# Patient Record
Sex: Female | Born: 1950
Health system: Southern US, Community
[De-identification: ages and names within clinical notes are randomized; demographics above are authoritative.]

## PROBLEM LIST (undated history)

## (undated) DIAGNOSIS — Z9889 Other specified postprocedural states: Secondary | ICD-10-CM

## (undated) DIAGNOSIS — I499 Cardiac arrhythmia, unspecified: Secondary | ICD-10-CM

## (undated) DIAGNOSIS — E785 Hyperlipidemia, unspecified: Secondary | ICD-10-CM

## (undated) HISTORY — PX: DILATION AND CURETTAGE OF UTERUS: SHX78

## (undated) HISTORY — DX: Hyperlipidemia, unspecified: E78.5

---

## 1994-01-22 LAB — CONVERTED CEMR LAB: Pap Smear: NORMAL

## 1999-09-22 ENCOUNTER — Encounter: Payer: Self-pay | Admitting: Obstetrics and Gynecology

## 1999-09-22 ENCOUNTER — Encounter: Admission: RE | Admit: 1999-09-22 | Discharge: 1999-09-22 | Payer: Self-pay | Admitting: Obstetrics and Gynecology

## 2000-10-11 ENCOUNTER — Encounter: Payer: Self-pay | Admitting: Obstetrics and Gynecology

## 2000-10-11 ENCOUNTER — Encounter: Admission: RE | Admit: 2000-10-11 | Discharge: 2000-10-11 | Payer: Self-pay | Admitting: Obstetrics and Gynecology

## 2001-12-05 ENCOUNTER — Encounter: Admission: RE | Admit: 2001-12-05 | Discharge: 2001-12-05 | Payer: Self-pay | Admitting: Obstetrics and Gynecology

## 2001-12-05 ENCOUNTER — Encounter: Payer: Self-pay | Admitting: Obstetrics and Gynecology

## 2002-12-13 ENCOUNTER — Encounter: Admission: RE | Admit: 2002-12-13 | Discharge: 2002-12-13 | Payer: Self-pay | Admitting: Obstetrics and Gynecology

## 2002-12-13 ENCOUNTER — Encounter: Payer: Self-pay | Admitting: Obstetrics and Gynecology

## 2003-12-30 ENCOUNTER — Ambulatory Visit (HOSPITAL_COMMUNITY): Admission: RE | Admit: 2003-12-30 | Discharge: 2003-12-30 | Payer: Self-pay | Admitting: Obstetrics and Gynecology

## 2004-08-14 ENCOUNTER — Ambulatory Visit (HOSPITAL_COMMUNITY): Admission: RE | Admit: 2004-08-14 | Discharge: 2004-08-14 | Payer: Self-pay | Admitting: *Deleted

## 2005-02-01 ENCOUNTER — Ambulatory Visit (HOSPITAL_COMMUNITY): Admission: RE | Admit: 2005-02-01 | Discharge: 2005-02-01 | Payer: Self-pay | Admitting: Obstetrics and Gynecology

## 2006-02-28 ENCOUNTER — Ambulatory Visit (HOSPITAL_COMMUNITY): Admission: RE | Admit: 2006-02-28 | Discharge: 2006-02-28 | Payer: Self-pay | Admitting: Obstetrics and Gynecology

## 2006-03-09 ENCOUNTER — Encounter: Admission: RE | Admit: 2006-03-09 | Discharge: 2006-03-09 | Payer: Self-pay | Admitting: Obstetrics and Gynecology

## 2007-04-03 ENCOUNTER — Ambulatory Visit (HOSPITAL_COMMUNITY): Admission: RE | Admit: 2007-04-03 | Discharge: 2007-04-03 | Payer: Self-pay | Admitting: Obstetrics and Gynecology

## 2008-04-15 ENCOUNTER — Ambulatory Visit (HOSPITAL_COMMUNITY): Admission: RE | Admit: 2008-04-15 | Discharge: 2008-04-15 | Payer: Self-pay | Admitting: Obstetrics and Gynecology

## 2009-04-21 ENCOUNTER — Ambulatory Visit (HOSPITAL_COMMUNITY): Admission: RE | Admit: 2009-04-21 | Discharge: 2009-04-21 | Payer: Self-pay | Admitting: Obstetrics and Gynecology

## 2009-05-28 ENCOUNTER — Encounter: Payer: Self-pay | Admitting: Family Medicine

## 2010-01-26 ENCOUNTER — Ambulatory Visit: Payer: Self-pay | Admitting: Family Medicine

## 2010-01-26 DIAGNOSIS — E785 Hyperlipidemia, unspecified: Secondary | ICD-10-CM

## 2010-01-26 DIAGNOSIS — I491 Atrial premature depolarization: Secondary | ICD-10-CM

## 2010-01-26 DIAGNOSIS — G43909 Migraine, unspecified, not intractable, without status migrainosus: Secondary | ICD-10-CM | POA: Insufficient documentation

## 2010-01-26 DIAGNOSIS — M810 Age-related osteoporosis without current pathological fracture: Secondary | ICD-10-CM | POA: Insufficient documentation

## 2010-03-04 ENCOUNTER — Ambulatory Visit
Admission: RE | Admit: 2010-03-04 | Discharge: 2010-03-04 | Payer: Self-pay | Source: Home / Self Care | Attending: Family Medicine | Admitting: Family Medicine

## 2010-03-04 ENCOUNTER — Other Ambulatory Visit: Payer: Self-pay | Admitting: Family Medicine

## 2010-03-04 LAB — CBC WITH DIFFERENTIAL/PLATELET
Basophils Absolute: 0 10*3/uL (ref 0.0–0.1)
Basophils Relative: 0.7 % (ref 0.0–3.0)
Eosinophils Absolute: 0.1 10*3/uL (ref 0.0–0.7)
Eosinophils Relative: 1.5 % (ref 0.0–5.0)
HCT: 43 % (ref 36.0–46.0)
Hemoglobin: 14.4 g/dL (ref 12.0–15.0)
Lymphocytes Relative: 35.9 % (ref 12.0–46.0)
Lymphs Abs: 1.7 10*3/uL (ref 0.7–4.0)
MCHC: 33.6 g/dL (ref 30.0–36.0)
MCV: 96.9 fl (ref 78.0–100.0)
Monocytes Absolute: 0.3 10*3/uL (ref 0.1–1.0)
Monocytes Relative: 7 % (ref 3.0–12.0)
Neutro Abs: 2.5 10*3/uL (ref 1.4–7.7)
Neutrophils Relative %: 54.9 % (ref 43.0–77.0)
Platelets: 284 10*3/uL (ref 150.0–400.0)
RBC: 4.44 Mil/uL (ref 3.87–5.11)
RDW: 12.7 % (ref 11.5–14.6)
WBC: 4.6 10*3/uL (ref 4.5–10.5)

## 2010-03-04 LAB — BASIC METABOLIC PANEL
BUN: 13 mg/dL (ref 6–23)
CO2: 28 mEq/L (ref 19–32)
Calcium: 9.3 mg/dL (ref 8.4–10.5)
Chloride: 106 mEq/L (ref 96–112)
Creatinine, Ser: 0.7 mg/dL (ref 0.4–1.2)
GFR: 85.05 mL/min (ref >60.00)
Glucose, Bld: 80 mg/dL (ref 70–99)
Potassium: 4.7 mEq/L (ref 3.5–5.1)
Sodium: 141 mEq/L (ref 135–145)

## 2010-03-04 LAB — CONVERTED CEMR LAB
Bilirubin Urine: NEGATIVE
Glucose, Urine, Semiquant: NEGATIVE
Ketones, urine, test strip: NEGATIVE
Nitrite: NEGATIVE
Specific Gravity, Urine: 1.015
Urobilinogen, UA: 0.2

## 2010-03-04 LAB — LIPID PANEL
Cholesterol: 201 mg/dL — ABNORMAL HIGH (ref 0–200)
HDL: 53.1 mg/dL (ref >39.00)
Total CHOL/HDL Ratio: 4
Triglycerides: 63 mg/dL (ref 0.0–149.0)
VLDL: 12.6 mg/dL (ref 0.0–40.0)

## 2010-03-04 LAB — HEPATIC FUNCTION PANEL
ALT: 22 U/L (ref 0–35)
AST: 25 U/L (ref 0–37)
Albumin: 3.9 g/dL (ref 3.5–5.2)
Alkaline Phosphatase: 55 U/L (ref 39–117)
Bilirubin, Direct: 0.1 mg/dL (ref 0.0–0.3)
Total Bilirubin: 0.8 mg/dL (ref 0.3–1.2)
Total Protein: 6.7 g/dL (ref 6.0–8.3)

## 2010-03-04 LAB — LDL CHOLESTEROL, DIRECT: Direct LDL: 142.6 mg/dL

## 2010-03-04 LAB — TSH: TSH: 1.48 u[IU]/mL (ref 0.35–5.50)

## 2010-03-11 ENCOUNTER — Ambulatory Visit
Admission: RE | Admit: 2010-03-11 | Discharge: 2010-03-11 | Payer: Self-pay | Source: Home / Self Care | Attending: Family Medicine | Admitting: Family Medicine

## 2010-03-24 NOTE — Assessment & Plan Note (Signed)
Summary: PT TO BE EST/RCD   Vital Signs:  Patient profile:   60 year old female Menstrual status:  postmenopausal LMP:     01/22/1994 Height:      65.25 inches Weight:      114 pounds BMI:     18.89 Temp:     98.5 degrees F oral Pulse rate:   12 / minute Pulse rhythm:   regular Resp:     80 per minute BP sitting:   110 / 76  (left arm) Cuff size:   regular  Vitals Entered By: Sid Falcon LPN (January 26, 2010 10:40 AM)  History of Present Illness: Patient is here to establish care.  She has past history of osteoporosis, migraine headaches, mild hyperlipidemia, and PACs. She had D and C back in 1986 and no other surgeries.  Takes calcium and vitamin D supplementation but no prescription medications. Allergy to penicillin and codeine.  She has been reluctant to start any osteoporosis meds after discussion with her former primary care physician.  Family history significant for father and sister with colon cancer. Patient has been receiving colonoscopy every 5 years and is due now. Mother and sister with hyperlipidemia. Mother with coronary disease in her 69s and history of abdominal aortic aneurysm. Father with history of stroke and atrial fibrillation. Mother had bladder cancer.  Patient is married. Nonsmoker. Occasional alcohol use.  She sees gynecologist regularly and had DEXA scan last year and exercises most days of week.  Past History:  Family History: Last updated: 01/26/2010 Father, colon CA, stroke, A-Fib mitrovalve prolapse Mother, high cholesterol, heart disease, diabetes typr ll, bladder CA Sister, colon CA, elevated cholesterol  Social History: Last updated: 01/26/2010 Occupation: Married Alcohol use-yes Smoked for 8 years, quit 3 pregnancies 2 live births  Past Medical History: Chicken pox Frequent  headaches/migraines Hyperlipidemia Premature Atrial Contractions Osteoporosis  Past Surgical History: Miscarriage, DNC 72 PMH-FH-SH reviewed for  relevance  Family History: Father, colon CA, stroke, A-Fib mitrovalve prolapse Mother, high cholesterol, heart disease, diabetes typr ll, bladder CA Sister, colon CA, elevated cholesterol  Social History: Occupation: Married Alcohol use-yes Smoked for 8 years, quit 3 pregnancies 2 live births Occupation:  employed  Review of Systems  The patient denies anorexia, fever, weight loss, weight gain, chest pain, syncope, dyspnea on exertion, peripheral edema, prolonged cough, headaches, hemoptysis, abdominal pain, melena, hematochezia, severe indigestion/heartburn, incontinence, muscle weakness, and depression.    Physical Exam  General:  Well-developed,well-nourished,in no acute distress; alert,appropriate and cooperative throughout examination Mouth:  Oral mucosa and oropharynx without lesions or exudates.  Teeth in good repair. Neck:  No deformities, masses, or tenderness noted. Lungs:  Normal respiratory effort, chest expands symmetrically. Lungs are clear to auscultation, no crackles or wheezes. Heart:  Normal rate and regular rhythm. S1 and S2 normal without gallop, murmur, click, rub or other extra sounds. Extremities:  No clubbing, cyanosis, edema, or deformity noted with normal full range of motion of all joints.   Psych:  normally interactive, good eye contact, not anxious appearing, and not depressed appearing.     Impression & Recommendations:  Problem # 1:  HYPERLIPIDEMIA (ICD-272.4) overall low risk for vascular disease.  Problem # 2:  OSTEOPOROSIS (ICD-733.00) discussed Calcium/Vit D supplement and types of weight bearing exercise.  She is reluctant to consider prescription meds at this time. Her updated medication list for this problem includes:    Calcium 600 Mg Tabs (Calcium) .Marland Kitchen..Marland Kitchen Two times a day    Vitamin D 1000  Unit Tabs (Cholecalciferol) .Marland Kitchen... 2 tabs two times a day  Problem # 3:  PREMATURE ATRIAL CONTRACTIONS (ICD-427.61)  Problem # 4:  MIGRAINE HEADACHE  (ICD-346.90)  Complete Medication List: 1)  Calcium 600 Mg Tabs (Calcium) .... Two times a day 2)  Vitamin D 1000 Unit Tabs (Cholecalciferol) .... 2 tabs two times a day  Patient Instructions: 1)  Schedule complete physical examination at your convenience   Orders Added: 1)  New Patient Level III [25366]    Preventive Care Screening  Last Tetanus Booster:    Date:  01/22/2005    Results:  Historical   Colonoscopy:    Date:  01/22/2005    Results:  normal   Pap Smear:    Date:  01/22/1994    Results:  normal

## 2010-03-26 NOTE — Assessment & Plan Note (Signed)
Summary: cpx/no pap/njr   Vital Signs:  Patient profile:   60 year old female Menstrual status:  postmenopausal Height:      65.5 inches Weight:      114 pounds BMI:     18.75 Temp:     98.2 degrees F oral BP sitting:   110 / 74  (left arm) Cuff size:   regular  Vitals Entered By: Duard Brady LPN (March 11, 2010 10:35 AM)  O2 Flow:  Room air CC: cpx - doing well Is Patient Diabetic? No   History of Present Illness: here for CPE. She sees Gyn for pap smears and mammograms. Exercises several times per week.  Needs repeat colonoscopy this year with pos FH colon cancer in sibling and father.  Declines flu.  Tetanus up to date.  Clinical Review Panels:  Prevention   Last Mammogram:  Normal (04/21/2009)   Last Pap Smear:  normal (01/22/1994)   Last Colonoscopy:  normal (01/22/2005)  Immunizations   Last Tetanus Booster:  Historical (01/22/2005)   Allergies (verified): 1)  ! Penicillin 2)  ! * Iv Contrast  Past History:  Past Medical History: Last updated: 01/26/2010 Chicken pox Frequent  headaches/migraines Hyperlipidemia Premature Atrial Contractions Osteoporosis  Past Surgical History: Last updated: 01/26/2010 Miscarriage, DNC 1986  Family History: Last updated: 03/11/2010 Father, colon CA, stroke, A-Fib mitrovalve prolapse Mother, high cholesterol, heart disease CABG 60s, diabetes typr ll, bladder CA Sister, colon CA, elevated cholesterol  Social History: Last updated: 01/26/2010 Occupation: Married Alcohol use-yes Smoked for 8 years, quit 3 pregnancies 2 live births PMH-FH-SH reviewed for relevance  Family History: Father, colon CA, stroke, A-Fib mitrovalve prolapse Mother, high cholesterol, heart disease CABG 60s, diabetes typr ll, bladder CA Sister, colon CA, elevated cholesterol  Review of Systems  The patient denies anorexia, fever, weight loss, weight gain, vision loss, decreased hearing, hoarseness, chest pain, syncope,  dyspnea on exertion, peripheral edema, prolonged cough, headaches, hemoptysis, abdominal pain, melena, hematochezia, severe indigestion/heartburn, hematuria, incontinence, genital sores, muscle weakness, suspicious skin lesions, transient blindness, difficulty walking, depression, unusual weight change, abnormal bleeding, enlarged lymph nodes, and breast masses.    Physical Exam  General:  Well-developed,well-nourished,in no acute distress; alert,appropriate and cooperative throughout examination Head:  Normocephalic and atraumatic without obvious abnormalities. No apparent alopecia or balding. Eyes:  No corneal or conjunctival inflammation noted. EOMI. Perrla. Funduscopic exam benign, without hemorrhages, exudates or papilledema. Vision grossly normal. Ears:  External ear exam shows no significant lesions or deformities.  Otoscopic examination reveals clear canals, tympanic membranes are intact bilaterally without bulging, retraction, inflammation or discharge. Hearing is grossly normal bilaterally. Mouth:  Oral mucosa and oropharynx without lesions or exudates.  Teeth in good repair. Neck:  No deformities, masses, or tenderness noted. Breasts:  gyn Lungs:  Normal respiratory effort, chest expands symmetrically. Lungs are clear to auscultation, no crackles or wheezes. Heart:  Normal rate and regular rhythm. S1 and S2 normal without gallop, murmur, click, rub or other extra sounds. Abdomen:  Bowel sounds positive,abdomen soft and non-tender without masses, organomegaly or hernias noted. Genitalia:  gyn Msk:  No deformity or scoliosis noted of thoracic or lumbar spine.   Extremities:  No clubbing, cyanosis, edema, or deformity noted with normal full range of motion of all joints.   Neurologic:  alert & oriented X3, cranial nerves II-XII intact, and strength normal in all extremities.   Skin:  no rashes and no suspicious lesions.   Cervical Nodes:  No lymphadenopathy noted Psych:  normally  interactive, good eye contact, not anxious appearing, and not depressed appearing.     Impression & Recommendations:  Problem # 1:  Preventive Health Care (ICD-V70.0) Cont regular exercise.  Labs reviewed.  Mildly elev lipids and we discussed reduction in trans fats and plenty of soluble fiber intake.  She will set up repeat colonoscopy.  Cont f/u with gyn  Complete Medication List: 1)  Calcium 600 Mg Tabs (Calcium) .... Two times a day 2)  Vitamin D 1000 Unit Tabs (Cholecalciferol) .Marland Kitchen.. 1 two times a day  Patient Instructions: 1)  It is important that you exercise reguarly at least 20 minutes 5 times a week. If you develop chest pain, have severe difficulty breathing, or feel very tired, stop exercising immediately and seek medical attention.  2)  Schedule a colonoscopy/ sigmoidoscopy to help detect colon cancer.  3)  Please schedule a follow-up appointment in 1 year.    Orders Added: 1)  Est. Patient 40-64 years [99396]

## 2010-03-26 NOTE — Letter (Signed)
Summary: Records from [facility] 2010 - 2011  Records from [facility] 2010 - 2011   Imported By: Maryln Gottron 02/05/2010 14:49:52  _____________________________________________________________________  External Attachment:    Type:   Image     Comment:   External Document

## 2010-05-11 ENCOUNTER — Other Ambulatory Visit (HOSPITAL_COMMUNITY): Payer: Self-pay | Admitting: Obstetrics and Gynecology

## 2010-05-11 DIAGNOSIS — Z1231 Encounter for screening mammogram for malignant neoplasm of breast: Secondary | ICD-10-CM

## 2010-05-15 ENCOUNTER — Ambulatory Visit (HOSPITAL_COMMUNITY)
Admission: RE | Admit: 2010-05-15 | Discharge: 2010-05-15 | Disposition: A | Discharge status: Home / Self Care | Payer: BC Managed Care – PPO | Source: Ambulatory Visit | Attending: Obstetrics and Gynecology | Admitting: Obstetrics and Gynecology

## 2010-05-15 DIAGNOSIS — Z1231 Encounter for screening mammogram for malignant neoplasm of breast: Secondary | ICD-10-CM | POA: Insufficient documentation

## 2010-05-27 ENCOUNTER — Ambulatory Visit (INDEPENDENT_AMBULATORY_CARE_PROVIDER_SITE_OTHER): Payer: BC Managed Care – PPO | Admitting: Family Medicine

## 2010-05-27 ENCOUNTER — Encounter: Payer: Self-pay | Admitting: Family Medicine

## 2010-05-27 ENCOUNTER — Ambulatory Visit (INDEPENDENT_AMBULATORY_CARE_PROVIDER_SITE_OTHER)
Admission: RE | Admit: 2010-05-27 | Discharge: 2010-05-27 | Disposition: A | Discharge status: Home / Self Care | Payer: BC Managed Care – PPO | Source: Ambulatory Visit | Attending: Family Medicine | Admitting: Family Medicine

## 2010-05-27 VITALS — BP 130/70 | Temp 98.5°F | Ht 65.5 in | Wt 115.0 lb

## 2010-05-27 DIAGNOSIS — M545 Low back pain, unspecified: Secondary | ICD-10-CM

## 2010-05-27 DIAGNOSIS — M79605 Pain in left leg: Secondary | ICD-10-CM

## 2010-05-27 NOTE — Patient Instructions (Signed)
Low Back Sprain with Rehab    A sprain is an injury in which a ligament is torn. The ligaments of the lower back are vulnerable to sprains. However, they are strong and require great force to be injured. These ligaments are important for stabilizing the spinal column. Sprains are classified into three categories. Grade 1 sprains cause pain, but the tendon is not lengthened. Grade 2 sprains include a lengthened ligament, due to the ligament being stretched or partially ruptured. With grade 2 sprains there is still function, although the function may be decreased. Grade 3 sprains involve a complete tear of the tendon or muscle, and function is usually impaired.   SYMPTOMS  Severe pain in the lower back.  Sometimes, a feeling of a "pop," "snap," or tear, at the time of injury.  Tenderness and sometimes swelling at the injury site.  Uncommonly, bruising (contusion) within 48 hours of injury.  Muscle spasms in the back.   CAUSES  Low back sprains occur when a force is placed on the ligaments that is greater than they can handle. Common causes of injury include:  Performing a stressful act while off-balance.  Repetitive stressful activities that involve movement of the lower back.  Direct hit (trauma) to the lower back.   RISK INCREASES WITH   Contact sports (football, wrestling).  Collisions (major skiing accidents).  Sports that require throwing or lifting (baseball, weightlifting).  Sports involving twisting of the spine (gymnastics, diving, tennis, golf).  Poor strength and flexibility.  Inadequate protection.  Previous back injury or surgery (especially fusion).   PREVENTIVE MEASURES   Wear properly fitted and padded protective equipment.  Warm up and stretch properly before activity.  Allow for adequate recovery between workouts.  Maintain physical fitness: l Strength, flexibility, and endurance. l Cardiovascular fitness.  Maintain a healthy body weight.     PROGNOSIS If treated properly, low back sprains usually heal with non-surgical treatment. The length of time for healing depends on the severity of the injury.    POSSIBLE COMPLICATIONS   Recurring symptoms, resulting in a chronic problem.  Chronic inflammation and pain in the low back.  Delayed healing or resolution of symptoms, especially if activity is resumed too soon.  Prolonged impairment.  Unstable or arthritic joints of the low back.   GENERAL TREATMENT CONSIDERATIONS  Treatment first involves the use of ice and medicine, to reduce pain and inflammation. The use of strengthening and stretching exercises may help reduce pain with activity. These exercises may be performed at home or with a therapist. Severe injuries may require referral to a therapist for further evaluation and treatment, such as ultrasound. Your caregiver may advise that you wear a back brace or corset, to help reduce pain and discomfort. Often, prolonged bed rest results in greater harm then benefit. Corticosteroid injections may be recommended. However, these should be reserved for the most serious cases. It is important to avoid using your back when lifting objects. At night, sleep on your back on a firm mattress, with a pillow placed under your knees. If non-surgical treatment is unsuccessful, surgery may be needed.    MEDICATION:   If pain medicine is needed, nonsteroidal anti-inflammatory medicines (aspirin and ibuprofen), or other minor pain relievers (acetaminophen), are often advised.   Do not take pain medicine for 7 days before surgery.   Prescription pain relievers may be given, if your caregiver thinks they are needed. Use only as directed and only as much as you need.  Ointments applied to   the skin may be helpful.  Corticosteroid injections may be given by your caregiver. These injections should be reserved for the most serious cases, because they may only be given a certain number of times.    HEAT AND COLD:   Cold treatment (icing) should be applied for 10 to 15 minutes every 2 to 3 hours for inflammation and pain, and immediately after activity that aggravates your symptoms. Use ice packs or an ice massage.  Heat treatment may be used before performing stretching and strengthening activities prescribed by your caregiver, physical therapist, or athletic trainer. Use a heat pack or a warm water soak.     SEEK MEDICAL CARE IF:   Symptoms get worse or do not improve in 2 to 4 weeks, despite treatment.  You develop numbness or weakness in either leg.  You lose bowel or bladder function.  Any of the following occur after surgery: fever, increased pain, swelling, redness, drainage of fluids, or bleeding in the affected area.  New, unexplained symptoms develop. (Drugs used in treatment may produce side effects.)     EXERCISES   RANGE OF MOTION AND STRETCHING EXERCISES - Low Back Sprain Most people with lower back pain will find that their symptoms get worse with excessive bending forward (flexion) or arching at the lower back (extension). The exercises that will help resolve your symptoms will focus on the opposite motion.    Your physician, physical therapist or athletic trainer will help you determine which exercises will be most helpful to resolve your lower back pain. Do not complete any exercises without first consulting with your caregiver. Discontinue any exercises which make your symptoms worse, until you speak to your caregiver.    If you have pain, numbness or tingling which travels down into your buttocks, leg or foot, the goal of the therapy is for these symptoms to move closer to your back and eventually resolve. Sometimes, these leg symptoms will get better, but your lower back pain may worsen. This is often an indication of progress in your rehabilitation. Be very alert to any changes in your symptoms and the activities in which you participated in the 24 hours prior  to the change. Sharing this information with your caregiver will allow him or her to most efficiently treat your condition.   These exercises may help you when beginning to rehabilitate your injury. Your symptoms may resolve with or without further involvement from your physician, physical therapist or athletic trainer. While completing these exercises, remember:   Restoring tissue flexibility helps normal motion to return to the joints. This allows healthier, less painful movement and activity.  An effective stretch should be held for at least 30 seconds.  A stretch should never be painful. You should only feel a gentle lengthening or release in the stretched tissue.      FLEXION RANGE OF MOTION AND STRETCHING EXERCISES:  STRETCH - Flexion, Single Knee to Chest   Lie on a firm bed or floor with both legs extended in front of you.  Keeping one leg in contact with the floor, bring your opposite knee to your chest. Hold your leg in place by either grabbing behind your thigh or at your knee.  Pull until you feel a gentle stretch in your low back. Hold __________ seconds.  Slowly release your grasp and repeat the exercise with the opposite side. Repeat __________ times. Complete this exercise __________ times per day.     STRETCH - Flexion, Double Knee to Chest     Lie on a firm bed or floor with both legs extended in front of you.  Keeping one leg in contact with the floor, bring your opposite knee to your chest.    Tense your stomach muscles to support your back and then lift your other knee to your chest. Hold your legs in place by either grabbing behind your thighs or at your knees.  Pull both knees toward your chest until you feel a gentle stretch in your low back. Hold __________ seconds.  Tense your stomach muscles and slowly return one leg at a time to the floor. Repeat __________ times. Complete this exercise __________ times per day.     STRETCH - Low Trunk Rotation   Lie  on a firm bed or floor. Keeping your legs in front of you, bend your knees so they are both pointed toward the ceiling and your feet are flat on the floor.  Extend your arms out to the side. This will stabilize your upper body by keeping your shoulders in contact with the floor.  Gently and slowly drop both knees together to one side until you feel a gentle stretch in your low back. Hold for __________ seconds.   Tense your stomach muscles to support your lower back as you bring your knees back to the starting position. Repeat the exercise to the other side. Repeat __________ times. Complete this exercise __________ times per day      EXTENSION RANGE OF MOTION AND FLEXIBILITY EXERCISES:  STRETCH - Extension, Prone on Elbows   Lie on your stomach on the floor, a bed will be too soft. Place your palms about shoulder width apart and at the height of your head.  Place your elbows under your shoulders. If this is too painful, stack pillows under your chest.  Allow your body to relax so that your hips drop lower and make contact more completely with the floor.  Hold this position for __________ seconds.  Slowly return to lying flat on the floor. Repeat __________ times. Complete this exercise __________ times per day.     RANGE OF MOTION - Extension, Prone Press Ups   Lie on your stomach on the floor, a bed will be too soft. Place your palms about shoulder width apart and at the height of your head.  Keeping your back as relaxed as possible, slowly straighten your elbows while keeping your hips on the floor. You may adjust the placement of your hands to maximize your comfort. As you gain motion, your hands will come more underneath your shoulders.  Hold this position __________ seconds.  Slowly return to lying flat on the floor. Repeat __________ times. Complete this exercise __________ times per day.     RANGE OF MOTION- Quadruped, Neutral Spine   Assume a hands and knees position  on a firm surface. Keep your hands under your shoulders and your knees under your hips. You may place padding under your knees for comfort.    Drop your head and point your tailbone toward the ground below you. This will round out your lower back like an angry cat. Hold this position for __________ seconds.   Slowly lift your head and release your tail bone so that your back sags into a large arch, like an old horse.  Hold this position for __________ seconds.   Repeat this until you feel limber in your low back.  Now, find your "sweet spot." This will be the most comfortable position somewhere between the two previous   positions. This is your neutral spine. Once you have found this position, tense your stomach muscles to support your low back.  Hold this position for __________ seconds. Repeat __________ times. Complete this exercise __________ times per day.      STRENGTHENING EXERCISES - Low Back Sprain These exercises may help you when beginning to rehabilitate your injury. These exercises should be done near your "sweet spot." This is the neutral, low-back arch, somewhere between fully rounded and fully arched, that is your least painful position. When performed in this safe range of motion, these exercises can be used for people who have either a flexion or extension based injury. These exercises may resolve your symptoms with or without further involvement from your physician, physical therapist or athletic trainer. While completing these exercises, remember:   Muscles can gain both the endurance and the strength needed for everyday activities through controlled exercises.  Complete these exercises as instructed by your physician, physical therapist or athletic trainer. Increase the resistance and repetitions only as guided.  You may experience muscle soreness or fatigue, but the pain or discomfort you are trying to eliminate should never worsen during these exercises. If this pain does  worsen, stop and make certain you are following the directions exactly. If the pain is still present after adjustments, discontinue the exercise until you can discuss the trouble with your caregiver.     STRENGTHENING - Deep Abdominals, Pelvic Tilt   Lie on a firm bed or floor. Keeping your legs in front of you, bend your knees so they are both pointed toward the ceiling and your feet are flat on the floor.  Tense your lower abdominal muscles to press your low back into the floor.  This motion will rotate your pelvis so that your tail bone is scooping upwards rather than pointing at your feet or into the floor. With a gentle tension and even breathing, hold this position for __________ seconds. Repeat __________ times. Complete this exercise __________ times per day.      STRENGTHENING - Abdominals, Crunches   Lie on a firm bed or floor. Keeping your legs in front of you, bend your knees so they are both pointed toward the ceiling and your feet are flat on the floor. Cross your arms over your chest.    Slightly tip your chin down without bending your neck.  Tense your abdominals and slowly lift your trunk high enough to just clear your shoulder blades. Lifting higher can put excessive stress on the lower back and does not further strengthen your abdominal muscles.  Control your return to the starting position. Repeat __________ times. Complete this exercise __________ times per day.     STRENGTHENING - Quadruped, Opposite UE/LE Lift   Assume a hands and knees position on a firm surface. Keep your hands under your shoulders and your knees under your hips. You may place padding under your knees for comfort.    Find your neutral spine and gently tense your abdominal muscles so that you can maintain this position. Your shoulders and hips should form a rectangle that is parallel with the floor and is not twisted.   Keeping your trunk steady, lift your right hand no higher than your shoulder  and then your left leg no higher than your hip. Make sure you are not holding your breath. Hold this position for __________ seconds.  Continuing to keep your abdominal muscles tense and your back steady, slowly return to your starting position. Repeat with the   opposite arm and leg. Repeat __________ times. Complete this exercise __________ times per day.      STRENGTHENING - Abdominals and Quadriceps, Straight Leg Raise   Lie on a firm bed or floor with both legs extended in front of you.  Keeping one leg in contact with the floor, bend the other knee so that your foot can rest flat on the floor.  Find your neutral spine, and tense your abdominal muscles to maintain your spinal position throughout the exercise.  Slowly lift your straight leg off the floor about 6 inches for a count of 15, making sure to not hold your breath.  Still keeping your neutral spine, slowly lower your leg all the way to the floor.   Repeat this exercise with each leg __________ times. Complete this exercise __________ times per day.     POSTURE AND BODY MECHANICS CONSIDERATIONS - Low Back Sprain Keeping correct posture when sitting, standing or completing your activities will reduce the stress put on different body tissues, allowing injured tissues a chance to heal and limiting painful experiences. The following are general guidelines for improved posture. Your physician or physical therapist will provide you with any instructions specific to your needs. While reading these guidelines, remember:  The exercises prescribed by your provider will help you have the flexibility and strength to maintain correct postures.  The correct posture provides the best environment for your joints to work. All of your joints have less wear and tear when properly supported by a spine with good posture. This means you will experience a healthier, less painful body.  Correct posture must be practiced with all of your activities,  especially prolonged sitting and standing. Correct posture is as important when doing repetitive low-stress activities (typing) as it is when doing a single heavy-load activity (lifting).     RESTING POSITIONS Consider which positions are most painful for you when choosing a resting position. If you have pain with flexion-based activities (sitting, bending, stooping, squatting), choose a position that allows you to rest in a less flexed posture. You would want to avoid curling into a fetal position on your side. If your pain worsens with extension-based activities (prolonged standing, working overhead), avoid resting in an extended position such as sleeping on your stomach. Most people will find more comfort when they rest with their spine in a more neutral position, neither too rounded nor too arched. Lying on a non-sagging bed on your side with a pillow between your knees, or on your back with a pillow under your knees will often provide some relief.  Keep in mind, being in any one position for a prolonged period of time, no matter how correct your posture, can still lead to stiffness.    PROPER SITTING POSTURE In order to minimize stress and discomfort on your spine, you must sit with correct posture. Sitting with good posture should be effortless for a healthy body. Returning to good posture is a gradual process. Many people can work toward this most comfortably by using various supports until they have the flexibility and strength to maintain this posture on their own.   When sitting with proper posture, your ears will fall over your shoulders and your shoulders will fall over your hips. You should use the back of the chair to support your upper back. Your lower back will be in a neutral position, just slightly arched. You may place a small pillow or folded towel at the base of your lower back for    support.    When working at a desk, create an environment that supports good, upright posture.  Without extra support, muscles tire, which leads to excessive strain on joints and other tissues. Keep these recommendations in mind:   CHAIR:    A chair should be able to slide under your desk when your back makes contact with the back of the chair. This allows you to work closely.  The chair's height should allow your eyes to be level with the upper part of your monitor and your hands to be slightly lower than your elbows.      BODY POSITION  Your feet should make contact with the floor. If this is not possible, use a foot rest.  Keep your ears over your shoulders. This will reduce stress on your neck and low back.     INCORRECT SITTING POSTURES  If you are feeling tired and unable to assume a healthy sitting posture, do not slouch or slump. This puts excessive strain on your back tissues, causing more damage and pain. Healthier options include:  Using more support, like a lumbar pillow.  Switching tasks to something that requires you to be upright or walking.  Talking a brief walk.  Lying down to rest in a neutral-spine position.      PROLONGED STANDING WHILE SLIGHTLY LEANING FORWARD  When completing a task that requires you to lean forward while standing in one place for a long time, place either foot up on a stationary 2-4 inch high object to help maintain the best posture. When both feet are on the ground, the lower back tends to lose its slight inward curve. If this curve flattens (or becomes too large), then the back and your other joints will experience too much stress, tire more quickly, and can cause pain.       CORRECT STANDING POSTURES Proper standing posture should be assumed with all daily activities, even if they only take a few moments, like when brushing your teeth. As in sitting, your ears should fall over your shoulders and your shoulders should fall over your hips. You should keep a slight tension in your abdominal muscles to brace your spine. Your tailbone  should point down to the ground, not behind your body, resulting in an over-extended swayback posture.      INCORRECT STANDING POSTURES  Common incorrect standing postures include a forward head, locked knees and/or an excessive swayback.     WALKING Walk with an upright posture. Your ears, shoulders and hips should all line-up.     PROLONGED ACTIVITY IN A FLEXED POSITION When completing a task that requires you to bend forward at your waist or lean over a low surface, try to find a way to stabilize 3 out of 4 of your limbs. You can place a hand or elbow on your thigh or rest a knee on the surface you are reaching across. This will provide you more stability, so that your muscles do not tire as quickly. By keeping your knees relaxed, or slightly bent, you will also reduce stress across your lower back.     CORRECT LIFTING TECHNIQUES DO :   Assume a wide stance. This will provide you more stability and the opportunity to get as close as possible to the object which you are lifting.  Tense your abdominals to brace your spine. Bend at the knees and hips. Keeping your back locked in a neutral-spine position, lift using your leg muscles. Lift with your legs, keeping your   back straight.  Test the weight of unknown objects before attempting to lift them.  Try to keep your elbows locked down at your sides in order get the best strength from your shoulders when carrying an object.  Always ask for help when lifting heavy or awkward objects.    INCORRECT LIFTING TECHNIQUES DO NOT:   Lock your knees when lifting, even if it is a small object.  Bend and twist. Pivot at your feet or move your feet when needing to change directions.  Assume that you can safely pick up even a paperclip without proper posture.   Document Released: 02/08/2005  Document Re-Released: 12/06/2008 [employer] Patient Information 2011 [employer]. 

## 2010-05-27 NOTE — Progress Notes (Signed)
  Subjective:    Patient ID: Tammy [name], female    DOB: Sep 02, 1950, 60 y.o.   MRN: [id number]  HPI Patient with history of low back pain mostly left sacroiliac region radiating posterior lateral to just below the knee. Symptoms are somewhat intermittent and worse at night.  Onset 3 months ago roughly. Pain is moderate at times rated 5-6/10. Achy quality. Worse at night. No associated numbness or weakness. No incontinence. No significant pain with walking. Advil helps.   Review of Systems  Constitutional: Negative for fever, chills, activity change and appetite change.  Respiratory: Negative for cough and shortness of breath.   Cardiovascular: Negative for chest pain.  Gastrointestinal: Negative for abdominal pain.  Genitourinary: Negative for dysuria.  Musculoskeletal: Positive for back pain. Negative for myalgias, joint swelling and gait problem.  Skin: Negative for rash.  Hematological: Negative for adenopathy. Does not bruise/bleed easily.       Objective:   Physical Exam  Constitutional: She appears well-developed and well-nourished.  Cardiovascular: Normal rate, regular rhythm and normal heart sounds.   No murmur heard. Pulmonary/Chest: Effort normal and breath sounds normal. She has no wheezes. She has no rales.  Musculoskeletal: She exhibits no edema.       Straight leg raises are negative. No edema lower extremities. No specific areas of point tenderness in the lower lumbar spine  Neurological:       Full-strength lower extremities. Normal sensory function. 2+ reflexes ankle and knee bilaterally  Skin: No rash noted.          Assessment & Plan:  Low back pain. Given duration of symptoms start with plain films. If unremarkable consider trial of physical therapy.

## 2010-06-10 ENCOUNTER — Ambulatory Visit: Payer: BC Managed Care – PPO | Attending: Family Medicine

## 2010-06-10 DIAGNOSIS — M25659 Stiffness of unspecified hip, not elsewhere classified: Secondary | ICD-10-CM | POA: Insufficient documentation

## 2010-06-10 DIAGNOSIS — M25559 Pain in unspecified hip: Secondary | ICD-10-CM | POA: Insufficient documentation

## 2010-06-10 DIAGNOSIS — IMO0001 Reserved for inherently not codable concepts without codable children: Secondary | ICD-10-CM | POA: Insufficient documentation

## 2010-06-19 ENCOUNTER — Ambulatory Visit: Payer: BC Managed Care – PPO

## 2010-06-26 ENCOUNTER — Ambulatory Visit: Payer: BC Managed Care – PPO | Attending: Family Medicine

## 2010-06-26 DIAGNOSIS — IMO0001 Reserved for inherently not codable concepts without codable children: Secondary | ICD-10-CM | POA: Insufficient documentation

## 2010-06-26 DIAGNOSIS — M25659 Stiffness of unspecified hip, not elsewhere classified: Secondary | ICD-10-CM | POA: Insufficient documentation

## 2010-06-26 DIAGNOSIS — M25559 Pain in unspecified hip: Secondary | ICD-10-CM | POA: Insufficient documentation

## 2010-08-03 ENCOUNTER — Encounter: Payer: Self-pay | Admitting: Family Medicine

## 2010-09-02 ENCOUNTER — Inpatient Hospital Stay (INDEPENDENT_AMBULATORY_CARE_PROVIDER_SITE_OTHER)
Admission: RE | Admit: 2010-09-02 | Discharge: 2010-09-02 | Disposition: A | Discharge status: Home / Self Care | Payer: BC Managed Care – PPO | Source: Ambulatory Visit | Attending: Family Medicine | Admitting: Family Medicine

## 2010-09-02 DIAGNOSIS — J029 Acute pharyngitis, unspecified: Secondary | ICD-10-CM

## 2010-09-03 ENCOUNTER — Encounter: Payer: Self-pay | Admitting: Internal Medicine

## 2010-09-03 ENCOUNTER — Ambulatory Visit (INDEPENDENT_AMBULATORY_CARE_PROVIDER_SITE_OTHER): Payer: BC Managed Care – PPO | Admitting: Internal Medicine

## 2010-09-03 ENCOUNTER — Ambulatory Visit: Payer: BC Managed Care – PPO | Admitting: Internal Medicine

## 2010-09-03 VITALS — BP 112/70 | Temp 98.1°F | Wt 115.0 lb

## 2010-09-03 DIAGNOSIS — J029 Acute pharyngitis, unspecified: Secondary | ICD-10-CM

## 2010-09-03 NOTE — Patient Instructions (Signed)
Get plenty of rest, Drink lots of  clear liquids, and use Tylenol or ibuprofen for fever and discomfort.    Call or return to clinic prn if these symptoms worsen or fail to improve as anticipated.  

## 2010-09-03 NOTE — Progress Notes (Signed)
  Subjective:    Patient ID: Tammy [name], female    DOB: October 25, 1950, 60 y.o.   MRN: [id number]  HPI 60 year old patient who is seen today for followup. She was seen at the urgent care yesterday after 3 days of sore throat. She has remote history of strep. There's been no fever. She was placed on a Z-Pak due to a penicillin allergy. A rapid strep was negative there was some concern about a possible early peritonsillar abscess it was recommended that she follow up today. The patient complains of only mild sore throat no constitutional complaints and no fever. She has completed one of 3 days of azithromycin    Review of Systems  Constitutional: Negative.   HENT: Negative for hearing loss, congestion, sore throat, rhinorrhea, dental problem, sinus pressure and tinnitus.   Eyes: Negative for pain, discharge and visual disturbance.  Respiratory: Negative for cough and shortness of breath.   Cardiovascular: Negative for chest pain, palpitations and leg swelling.  Gastrointestinal: Negative for nausea, vomiting, abdominal pain, diarrhea, constipation, blood in stool and abdominal distention.  Genitourinary: Negative for dysuria, urgency, frequency, hematuria, flank pain, vaginal bleeding, vaginal discharge, difficulty urinating, vaginal pain and pelvic pain.  Musculoskeletal: Negative for joint swelling, arthralgias and gait problem.  Skin: Negative for rash.  Neurological: Negative for dizziness, syncope, speech difficulty, weakness, numbness and headaches.  Hematological: Negative for adenopathy.  Psychiatric/Behavioral: Negative for behavioral problems, dysphoric mood and agitation. The patient is not nervous/anxious.        Objective:   Physical Exam  Constitutional: She is oriented to person, place, and time. She appears well-developed and well-nourished. No distress.  HENT:  Head: Normocephalic.  Right Ear: External ear normal.  Left Ear: External ear normal.       Very mild erythema of  the oropharynx. The soft palate slightly asymmetric but nothing to suggest a soft tissue abscess. No cervical adenopathy  Eyes: Conjunctivae and EOM are normal. Pupils are equal, round, and reactive to light.  Neck: Normal range of motion. Neck supple. No thyromegaly present.  Cardiovascular: Normal rate, regular rhythm, normal heart sounds and intact distal pulses.   Pulmonary/Chest: Effort normal and breath sounds normal.  Abdominal: Soft. Bowel sounds are normal. She exhibits no mass. There is no tenderness.  Musculoskeletal: Normal range of motion.  Lymphadenopathy:    She has no cervical adenopathy.  Neurological: She is alert and oriented to person, place, and time.  Skin: Skin is warm and dry. No rash noted.  Psychiatric: She has a normal mood and affect. Her behavior is normal.          Assessment & Plan:   Viral pharyngitis.  She will call if there is any clinical worsening. Tylenol and/or Advil recommended

## 2010-09-17 ENCOUNTER — Encounter: Payer: Self-pay | Admitting: Family Medicine

## 2010-09-17 ENCOUNTER — Ambulatory Visit (INDEPENDENT_AMBULATORY_CARE_PROVIDER_SITE_OTHER): Payer: BC Managed Care – PPO | Admitting: Family Medicine

## 2010-09-17 VITALS — BP 140/80 | Temp 98.6°F | Wt 114.0 lb

## 2010-09-17 DIAGNOSIS — B9789 Other viral agents as the cause of diseases classified elsewhere: Secondary | ICD-10-CM

## 2010-09-17 DIAGNOSIS — L988 Other specified disorders of the skin and subcutaneous tissue: Secondary | ICD-10-CM

## 2010-09-17 NOTE — Progress Notes (Signed)
  Subjective:    Patient ID: Tammy [name], female    DOB: 1951-01-23, 60 y.o.   MRN: [id number]  HPI Patient seen with small blisterlike lesion right soft palate noted couple days ago. About 2 weeks ago had severe sore throat. Went to urgent care. Rapid strep negative. Some question of early peritonsillar abscess and treated with Zithromax. Reevaluated here the next day with no concern for peritonsillar abscess. Symptoms gradually improved with salt water gargles. Sore throat is fully resolved and she has not had any adenopathy. No pain with swallowing. She does notice a small blisterlike lesion couple days ago. No hand or foot rash. No appetite or weight change   Review of Systems  Constitutional: Negative for fever, chills and fatigue.  HENT: Negative for congestion, sore throat and sinus pressure.   Respiratory: Negative for cough.   Skin: Negative for rash.       Objective:   Physical Exam  Constitutional: She appears well-developed and well-nourished.  HENT:  Head: Normocephalic.  Right Ear: External ear normal.  Left Ear: External ear normal.       No exudate. Minimal post pharynx erythema. Very small approximately 1 minute millimeter vesicular-type lesion right soft palate area. No soft palate asymmetry and no evidence for abscess. No masses  Neck: Neck supple.  Cardiovascular: Normal rate and regular rhythm.   Pulmonary/Chest: Effort normal and breath sounds normal. She has no wheezes. She has no rales.  Musculoskeletal: She exhibits no edema.  Lymphadenopathy:    She has no cervical adenopathy.          Assessment & Plan:  Small vesicular lesion isolated right soft palate. Probably viral. Question coxsackie but no hand or foot lesions. Saltwater gargles and observe

## 2010-09-17 NOTE — Patient Instructions (Signed)
Salt water gargles and touch base if any persistent or worsening symptoms.

## 2011-05-12 ENCOUNTER — Other Ambulatory Visit (HOSPITAL_COMMUNITY): Payer: Self-pay | Admitting: Obstetrics and Gynecology

## 2011-05-12 DIAGNOSIS — Z1231 Encounter for screening mammogram for malignant neoplasm of breast: Secondary | ICD-10-CM

## 2011-06-04 ENCOUNTER — Ambulatory Visit (HOSPITAL_COMMUNITY): Payer: BC Managed Care – PPO

## 2011-06-24 ENCOUNTER — Ambulatory Visit (HOSPITAL_COMMUNITY)
Admission: RE | Admit: 2011-06-24 | Discharge: 2011-06-24 | Disposition: A | Discharge status: Home / Self Care | Payer: BC Managed Care – PPO | Source: Ambulatory Visit | Attending: Obstetrics and Gynecology | Admitting: Obstetrics and Gynecology

## 2011-06-24 DIAGNOSIS — Z1231 Encounter for screening mammogram for malignant neoplasm of breast: Secondary | ICD-10-CM | POA: Insufficient documentation

## 2011-08-06 ENCOUNTER — Other Ambulatory Visit (INDEPENDENT_AMBULATORY_CARE_PROVIDER_SITE_OTHER): Payer: BC Managed Care – PPO

## 2011-08-06 DIAGNOSIS — Z0000 Encounter for general adult medical examination without abnormal findings: Secondary | ICD-10-CM

## 2011-08-06 LAB — POCT URINALYSIS DIPSTICK
Bilirubin, UA: NEGATIVE
Glucose, UA: NEGATIVE
Ketones, UA: NEGATIVE
Protein, UA: NEGATIVE
Spec Grav, UA: 1.015
Urobilinogen, UA: 0.2
pH, UA: 6.5

## 2011-08-06 LAB — TSH: TSH: 1.17 u[IU]/mL (ref 0.35–5.50)

## 2011-08-06 LAB — CBC WITH DIFFERENTIAL/PLATELET
Basophils Absolute: 0 10*3/uL (ref 0.0–0.1)
Basophils Relative: 0.8 % (ref 0.0–3.0)
Eosinophils Absolute: 0.1 10*3/uL (ref 0.0–0.7)
Eosinophils Relative: 1.1 % (ref 0.0–5.0)
HCT: 44.5 % (ref 36.0–46.0)
Hemoglobin: 14.5 g/dL (ref 12.0–15.0)
Lymphocytes Relative: 33.6 % (ref 12.0–46.0)
Lymphs Abs: 1.7 10*3/uL (ref 0.7–4.0)
MCHC: 32.5 g/dL (ref 30.0–36.0)
MCV: 97.9 fl (ref 78.0–100.0)
Monocytes Absolute: 0.4 10*3/uL (ref 0.1–1.0)
Monocytes Relative: 7.8 % (ref 3.0–12.0)
Neutro Abs: 3 10*3/uL (ref 1.4–7.7)
Neutrophils Relative %: 56.7 % (ref 43.0–77.0)
Platelets: 270 10*3/uL (ref 150.0–400.0)
RDW: 12.9 % (ref 11.5–14.6)

## 2011-08-06 LAB — BASIC METABOLIC PANEL
CO2: 29 mEq/L (ref 19–32)
Calcium: 9.3 mg/dL (ref 8.4–10.5)
Chloride: 108 mEq/L (ref 96–112)
Creatinine, Ser: 0.8 mg/dL (ref 0.4–1.2)
GFR: 80.85 mL/min (ref >60.00)
Glucose, Bld: 81 mg/dL (ref 70–99)
Sodium: 141 mEq/L (ref 135–145)

## 2011-08-06 LAB — HEPATIC FUNCTION PANEL
ALT: 22 U/L (ref 0–35)
AST: 26 U/L (ref 0–37)
Albumin: 4 g/dL (ref 3.5–5.2)
Alkaline Phosphatase: 65 U/L (ref 39–117)
Bilirubin, Direct: 0.1 mg/dL (ref 0.0–0.3)
Total Bilirubin: 1 mg/dL (ref 0.3–1.2)
Total Protein: 7.1 g/dL (ref 6.0–8.3)

## 2011-08-06 LAB — LIPID PANEL
HDL: 57.4 mg/dL (ref >39.00)
Total CHOL/HDL Ratio: 3
Triglycerides: 53 mg/dL (ref 0.0–149.0)

## 2011-08-18 ENCOUNTER — Encounter: Payer: Self-pay | Admitting: Family Medicine

## 2011-08-18 ENCOUNTER — Ambulatory Visit (INDEPENDENT_AMBULATORY_CARE_PROVIDER_SITE_OTHER): Payer: BC Managed Care – PPO | Admitting: Family Medicine

## 2011-08-18 VITALS — BP 138/72 | HR 72 | Temp 98.2°F | Resp 12 | Ht 65.5 in | Wt 111.0 lb

## 2011-08-18 DIAGNOSIS — R319 Hematuria, unspecified: Secondary | ICD-10-CM

## 2011-08-18 DIAGNOSIS — Z0000 Encounter for general adult medical examination without abnormal findings: Secondary | ICD-10-CM

## 2011-08-18 LAB — POCT URINALYSIS DIPSTICK
Bilirubin, UA: NEGATIVE
Glucose, UA: NEGATIVE
Ketones, UA: NEGATIVE
Nitrite, UA: NEGATIVE
Spec Grav, UA: <=1.005
Urobilinogen, UA: 0.2
pH, UA: 6

## 2011-08-18 NOTE — Progress Notes (Signed)
  Subjective:    Patient ID: Tammy [name], female    DOB: 1951/01/08, 61 y.o.   MRN: [id number]  HPI  Complete physical. Patient continues to see gynecologist. Recent mammogram normal. Immunizations reviewed. She does not get flu vaccines. No history of shingles vaccine. Other immunizations up-to-date. Colonoscopy last year. Family history of colon cancer father and sister. Mother had bladder cancer.  Patient is a former smoker. Quit 1975. No dyspnea. No chronic cough. Does have occasional vague right-sided chest discomfort which may be related to eating. Symptoms are very inconsistent. No appetite or weight changes. No vomiting. No stool changes. No dysphagia.  Past Medical History  Diagnosis Date  . Hyperlipidemia    No past surgical history on file.  reports that she quit smoking about 38 years ago. Her smoking use included Cigarettes. She has a 5 pack-year smoking history. She does not have any smokeless tobacco history on file. Her alcohol and drug histories not on file. family history includes Cancer in her father, mother, and sister; Heart disease in her father; and Heart disease (age of onset:70) in her mother. Allergies  Allergen Reactions  . Penicillins       Review of Systems  Constitutional: Negative for fever, activity change, appetite change, fatigue and unexpected weight change.  HENT: Negative for hearing loss, ear pain, sore throat and trouble swallowing.   Eyes: Negative for visual disturbance.  Respiratory: Negative for cough and shortness of breath.   Cardiovascular: Negative for chest pain and palpitations.  Gastrointestinal: Negative for abdominal pain, diarrhea, constipation and blood in stool.  Genitourinary: Negative for dysuria and hematuria.  Musculoskeletal: Negative for myalgias, back pain and arthralgias.  Skin: Negative for rash.  Neurological: Negative for dizziness, syncope and headaches.  Hematological: Negative for adenopathy.    Psychiatric/Behavioral: Negative for confusion and dysphoric mood.       Objective:   Physical Exam  Constitutional: She is oriented to person, place, and time. She appears well-developed and well-nourished.  HENT:  Head: Normocephalic and atraumatic.  Eyes: EOM are normal. Pupils are equal, round, and reactive to light.  Neck: Normal range of motion. Neck supple. No thyromegaly present.  Cardiovascular: Normal rate, regular rhythm and normal heart sounds.   No murmur heard. Pulmonary/Chest: Breath sounds normal. No respiratory distress. She has no wheezes. She has no rales.  Abdominal: Soft. Bowel sounds are normal. She exhibits no distension and no mass. There is no tenderness. There is no rebound and no guarding.  Genitourinary:       Deferred per gyn  Musculoskeletal: Normal range of motion. She exhibits no edema.  Lymphadenopathy:    She has no cervical adenopathy.  Neurological: She is alert and oriented to person, place, and time. She displays normal reflexes. No cranial nerve deficit.  Skin: No rash noted.  Psychiatric: She has a normal mood and affect. Her behavior is normal. Judgment and thought content normal.          Assessment & Plan:  Complete physical. Labs reviewed with patient. Repeat urinalysis with trace blood on dipstick. Check on coverage for shingles vaccine. She will continue with gyn follow up.

## 2011-08-18 NOTE — Patient Instructions (Addendum)
Check on coverage for shingles vaccine Continue regular weightbearing exercise. Continue regular calcium and vitamin D supplementation. Monitor blood pressure to make sure this is staying consistently below 140/90.

## 2011-08-19 LAB — URINALYSIS, MICROSCOPIC ONLY
Bacteria, UA: NONE SEEN
Casts: NONE SEEN
Crystals: NONE SEEN
Squamous Epithelial / HPF: NONE SEEN

## 2011-08-20 NOTE — Progress Notes (Signed)
Quick Note:  Pt informed on personally identified VM ______ 

## 2011-10-04 ENCOUNTER — Telehealth: Payer: Self-pay | Admitting: Family Medicine

## 2011-10-04 NOTE — Telephone Encounter (Signed)
Spoke with patient. No appetite or weight changes. She has occasional sensation of discomfort lower esophageal region. No obvious reflux. She'll try over-the-counter Prilosec for one month. Be in touch if this is not helping in the next few weeks. Consider referral to GI versus upper GI series if not improving

## 2011-10-04 NOTE — Telephone Encounter (Signed)
Pt still having discomfort after swallowing food. Pt req to get xray as previously discussed or what ever option Dr Caryl Never feels necessary.

## 2012-02-29 ENCOUNTER — Telehealth: Payer: Self-pay | Admitting: Family Medicine

## 2012-02-29 NOTE — Telephone Encounter (Signed)
Patient Information:  Caller Name: Carline  Phone: [phone]  Patient: Tammy [name], Tammy [name]  Gender: Female  DOB: 1950/07/10  Age: 62 Years  PCP: Evelena Peat [facility])  Office Follow Up:  Does the office need to follow up with this patient?: No  Instructions For The Office: N/A  RN Note:  triaged patient per [facility] Flu Like Symptoms Protocol.  See Provider within 8 Hours Disposition.  First available appt with Dr Caryl Never in office was at 1345 (appt scheduled)  Symptoms  Reason For Call & Symptoms: pt reports that she began feeling body aches and chills.  Pt also reports a cough.  Pt is concerned about the flu.  Pt has not had a flu shot  Reviewed Health History In EMR: Yes  Reviewed Medications In EMR: Yes  Reviewed Allergies In EMR: Yes  Reviewed Surgeries / Procedures: Yes  Date of Onset of Symptoms: 02/28/2012  Treatments Tried: Tylenol, Advil  Treatments Tried Worked: Yes  Any Fever: Yes  Fever Taken: Oral  Fever Time Of Reading: 15:30:00  Fever Last Reading: 100  Guideline(s) Used:  No Protocol Available - Sick Adult  Disposition Per Guideline:   See Today or Tomorrow in Office  Reason For Disposition Reached:   Nursing judgment  Advice Given:  N/A  Appointment Scheduled:  03/01/2012 13:45:00 Appointment Scheduled Provider:  Evelena Peat (Family Practice)

## 2012-03-01 ENCOUNTER — Ambulatory Visit (INDEPENDENT_AMBULATORY_CARE_PROVIDER_SITE_OTHER): Payer: BC Managed Care – PPO | Admitting: Family Medicine

## 2012-03-01 ENCOUNTER — Encounter: Payer: Self-pay | Admitting: Family Medicine

## 2012-03-01 VITALS — BP 110/62 | Temp 99.8°F | Wt 109.0 lb

## 2012-03-01 DIAGNOSIS — R509 Fever, unspecified: Secondary | ICD-10-CM

## 2012-03-01 NOTE — Progress Notes (Signed)
  Subjective:    Patient ID: Tammy [name], female    DOB: 1950-12-05, 62 y.o.   MRN: [id number]  HPI  Acute visit. Onset about 36 hours ago of flulike symptoms. Fever up to 100.7 last night. Headaches, cough, and mild body aches. No sore throat. Minimal nasal congestion. Took a couple of Advil around 10:15 AM today. Advil helps. No nausea or vomiting. 16-year-old nephew had similar symptoms and she was around him last weekend.  Review of Systems  Constitutional: Positive for fever, chills and fatigue.  HENT: Positive for congestion.   Respiratory: Positive for cough. Negative for shortness of breath.   Cardiovascular: Negative for chest pain.  Gastrointestinal: Negative for abdominal pain.  Neurological: Positive for headaches.       Objective:   Physical Exam  Constitutional: She appears well-developed and well-nourished.  HENT:  Right Ear: External ear normal.  Left Ear: External ear normal.  Mouth/Throat: Oropharynx is clear and moist.  Neck: Neck supple.  Cardiovascular: Normal rate and regular rhythm.   Pulmonary/Chest: Effort normal and breath sounds normal. No respiratory distress. She has no wheezes. She has no rales.  Lymphadenopathy:    She has no cervical adenopathy.  Skin: No rash noted.          Assessment & Plan:  Viral syndrome. Check influenza screen. If negative treat symptomatically.

## 2012-03-01 NOTE — Telephone Encounter (Signed)
noted 

## 2012-03-08 ENCOUNTER — Telehealth: Payer: Self-pay | Admitting: Family Medicine

## 2012-03-08 NOTE — Telephone Encounter (Signed)
Patient Information:  Caller Name: Lauramae  Phone: [phone]  Patient: Tammy [name], Tammy [name]  Gender: Female  DOB: 1951/01/07  Age: 62 Years  PCP: Evelena Peat [facility])  Office Follow Up:  Does the office need to follow up with this patient?: No  Instructions For The Office: N/A  RN Note:  Was seen in office testing negative for flu.  Over several days patient felt better until 03/06/12.  She had been out and when she cam back she noticed increased coughing and ran a lowe grade temp of 99-100.3 for about 12 hours.  No other issues noted except lingering cough which seems to be a little more than it was.  Cough sounds like a smokers cough with laughter and at times-not sure if it is in throat or in lungs; Intake/Output normal for patient. With a disposition of home care, caller was given home care and demonstrated her understanding.  Symptoms  Reason For Call & Symptoms: Was seen last week and tested negative for flu.  Has been much better and continued in side activities.  However on went out and when came home noticed and increase in coughing, but also spiked a fever 100.3 and went down staying in the 99 range.  Has gone and not returned.  Reviewed Health History In EMR: Yes  Reviewed Medications In EMR: Yes  Reviewed Allergies In EMR: Yes  Reviewed Surgeries / Procedures: Yes  Date of Onset of Symptoms: 03/06/2012  Guideline(s) Used:  Cough  Disposition Per Guideline:   Home Care  Reason For Disposition Reached:   Cough with cold symptoms (e.g., runny nose, postnasal drip, throat clearing)  Advice Given:  Cough Medicines:  OTC Cough Drops: Cough drops can help a lot, especially for mild coughs. They reduce coughing by soothing your irritated throat and removing that tickle sensation in the back of the throat. Cough drops also have the advantage of portability - you can carry them with you.  Home Remedy - Hard Candy: Hard candy works just as well as medicine-flavored OTC  cough drops. Diabetics should use sugar-free candy.  Home Remedy - Honey: This old home remedy has been shown to help decrease coughing at night. The adult dosage is 2 teaspoons (10 ml) at bedtime. Honey should not be given to infants under one year of age.  OTC Cough Syrup - Dextromethorphan:  Examples: Benylin, Robitussin DM, Vicks 44 Cough Relief  Coughing Spasms:  Drink warm fluids. Inhale warm mist (Reason: both relax the airway and loosen up the phlegm).  Suck on cough drops or hard candy to coat the irritated throat.  Prevent Dehydration:  Drink adequate liquids.  This will help soothe an irritated or dry throat and loosen up the phlegm.  Call Back If:  Difficulty breathing

## 2012-03-09 NOTE — Telephone Encounter (Signed)
Pt informed and voiced understanding

## 2012-03-09 NOTE — Telephone Encounter (Signed)
Follow up here promptly for any recurrent fever or dyspnea.

## 2012-03-16 ENCOUNTER — Telehealth: Payer: Self-pay | Admitting: Family Medicine

## 2012-03-16 NOTE — Telephone Encounter (Signed)
Patient Information:  Caller Name: Smt  Phone: [phone]  Patient: Tammy [name], Tammy [name]  Gender: Female  DOB: [birth year]  Age: 62 Years  PCP: Evelena Peat (Family Practice)  Office Follow Up:  Does the office need to follow up with this patient?: No  Instructions For The Office: N/A   Symptoms  Reason For Call & Symptoms: Patient was in the office on 03/01/12 for fever and seen by Dr. Caryl Never. Influenza test negative but treated symptomatically for the flu.  Patient slowly improved. However, she still coughs phlegm yellow in color. Fever return again this afternoon after a week/ Chills at 99.4 (o) . She states she is constantly clearing her throat. Constantly feels something in throat. . No sore throat, voice is NOT hoarse.   She is sleeping at night.  Reviewed Health History In EMR: Yes  Reviewed Medications In EMR: Yes  Reviewed Allergies In EMR: Yes  Reviewed Surgeries / Procedures: No  Date of Onset of Symptoms: 03/01/2012  Treatments Tried: Advil PRN when needed.    Breath steam  Treatments Tried Worked: Yes  Any Fever: Yes  Fever Taken: Oral  Fever Time Of Reading: 16:15:00  Fever Last Reading: 99.4  Guideline(s) Used:  Cough  Disposition Per Guideline:   See Within 3 Days in Office  Reason For Disposition Reached:   Cough has been present for > 10 days  Advice Given:  Cough Medicines:  OTC Cough Drops: Cough drops can help a lot, especially for mild coughs. They reduce coughing by soothing your irritated throat and removing that tickle sensation in the back of the throat. Cough drops also have the advantage of portability - you can carry them with you.  Home Remedy - Hard Candy: Hard candy works just as well as medicine-flavored OTC cough drops. Diabetics should use sugar-free candy.  Home Remedy - Honey: This old home remedy has been shown to help decrease coughing at night. The adult dosage is 2 teaspoons (10 ml) at bedtime. Honey should not be given to infants  under one year of age.  Coughing Spasms:  Drink warm fluids. Inhale warm mist (Reason: both relax the airway and loosen up the phlegm).  Suck on cough drops or hard candy to coat the irritated throat.  Prevent Dehydration:  Drink adequate liquids.  This will help soothe an irritated or dry throat and loosen up the phlegm.  Avoid Tobacco Smoke:  Smoking or being exposed to smoke makes coughs much worse.  Call Back If:  Difficulty breathing  Cough lasts more than 3 weeks  Fever lasts > 3 days  You become worse.  Appointment Scheduled:  03/17/2012 13:45:00 Appointment Scheduled Provider:  Evelena Peat [facility])

## 2012-03-17 ENCOUNTER — Encounter: Payer: Self-pay | Admitting: Family Medicine

## 2012-03-17 ENCOUNTER — Ambulatory Visit (INDEPENDENT_AMBULATORY_CARE_PROVIDER_SITE_OTHER)
Admission: RE | Admit: 2012-03-17 | Discharge: 2012-03-17 | Disposition: A | Discharge status: Home / Self Care | Payer: BC Managed Care – PPO | Source: Ambulatory Visit | Attending: Family Medicine | Admitting: Family Medicine

## 2012-03-17 ENCOUNTER — Ambulatory Visit (INDEPENDENT_AMBULATORY_CARE_PROVIDER_SITE_OTHER): Payer: BC Managed Care – PPO | Admitting: Family Medicine

## 2012-03-17 VITALS — BP 120/60 | Temp 98.4°F | Wt 108.0 lb

## 2012-03-17 DIAGNOSIS — R509 Fever, unspecified: Secondary | ICD-10-CM

## 2012-03-17 DIAGNOSIS — R05 Cough: Secondary | ICD-10-CM

## 2012-03-17 LAB — CBC WITH DIFFERENTIAL/PLATELET
Basophils Relative: 0.2 % (ref 0.0–3.0)
Eosinophils Absolute: 0 10*3/uL (ref 0.0–0.7)
HCT: 39.9 % (ref 36.0–46.0)
Hemoglobin: 13.4 g/dL (ref 12.0–15.0)
Lymphocytes Relative: 14.4 % (ref 12.0–46.0)
Lymphs Abs: 1.7 10*3/uL (ref 0.7–4.0)
MCHC: 33.6 g/dL (ref 30.0–36.0)
MCV: 94.4 fl (ref 78.0–100.0)
Monocytes Absolute: 0.7 10*3/uL (ref 0.1–1.0)
Monocytes Relative: 5.6 % (ref 3.0–12.0)
Neutro Abs: 9.4 10*3/uL — ABNORMAL HIGH (ref 1.4–7.7)
Neutrophils Relative %: 79.4 % — ABNORMAL HIGH (ref 43.0–77.0)
Platelets: 487 10*3/uL — ABNORMAL HIGH (ref 150.0–400.0)
RBC: 4.23 Mil/uL (ref 3.87–5.11)
RDW: 12.8 % (ref 11.5–14.6)

## 2012-03-17 MED ORDER — AZITHROMYCIN 250 MG PO TABS: ORAL_TABLET | ORAL | Status: AC

## 2012-03-17 NOTE — Progress Notes (Signed)
  Subjective:    Patient ID: Tammy [name], female    DOB: Sep 02, 1950, 62 y.o.   MRN: [id number]  HPI  Patient presented with flulike symptoms couple weeks ago. She seemed to be recovering with symptomatic treatment and then yesterday had temperature of 99.7 and felt chilled. She's had some persistent cough off and on mostly nonproductive. No chest pains. No dyspnea. Generally feels well today. No fever or chills today. No nausea or vomiting. No hemoptysis. Appetite is good.  Past Medical History  Diagnosis Date  . Hyperlipidemia    No past surgical history on file.  reports that she quit smoking about 38 years ago. Her smoking use included Cigarettes. She has a 5 pack-year smoking history. She does not have any smokeless tobacco history on file. Her alcohol and drug histories not on file. family history includes Cancer in her father, mother, and sister; Heart disease in her father; and Heart disease (age of onset:70) in her mother. Allergies  Allergen Reactions  . Penicillins       Review of Systems  Constitutional: Positive for fever and chills.  HENT: Negative for sore throat and neck stiffness.   Respiratory: Positive for cough. Negative for shortness of breath and wheezing.   Genitourinary: Negative for dysuria.  Skin: Negative for rash.  Neurological: Negative for headaches.       Objective:   Physical Exam  Constitutional: She appears well-developed and well-nourished.  HENT:  Right Ear: External ear normal.  Left Ear: External ear normal.  Mouth/Throat: Oropharynx is clear and moist.  Neck: Neck supple.  Cardiovascular: Normal rate and regular rhythm.   Pulmonary/Chest: Effort normal and breath sounds normal. No respiratory distress. She has no wheezes. She has no rales.  Lymphadenopathy:    She has no cervical adenopathy.  Skin: No rash noted.          Assessment & Plan:  Patient presents with fever following recent viral illness. She does not have any  confirmed fever today and nonfocal exam. Concern would be pneumonia even though exam is nonfocal she is nontoxic in appearance. Check CBC and chest x-ray. Start Zithromax  CBC mild elevation of WBC and CXR shows ?right base infiltrate.  Pt has been notified.  She has already started Zithromax and be in touch early next week if fever not resolved, sooner prn.

## 2012-03-18 NOTE — Progress Notes (Signed)
Quick Note:  Pt informed ______ 

## 2012-04-08 ENCOUNTER — Other Ambulatory Visit: Payer: Self-pay

## 2012-05-05 ENCOUNTER — Other Ambulatory Visit (HOSPITAL_COMMUNITY): Payer: Self-pay | Admitting: Obstetrics and Gynecology

## 2012-06-26 ENCOUNTER — Ambulatory Visit (HOSPITAL_COMMUNITY)
Admission: RE | Admit: 2012-06-26 | Discharge: 2012-06-26 | Disposition: A | Discharge status: Home / Self Care | Payer: BC Managed Care – PPO | Source: Ambulatory Visit | Attending: Obstetrics and Gynecology | Admitting: Obstetrics and Gynecology

## 2012-06-26 DIAGNOSIS — Z1231 Encounter for screening mammogram for malignant neoplasm of breast: Secondary | ICD-10-CM | POA: Insufficient documentation

## 2012-06-27 ENCOUNTER — Other Ambulatory Visit: Payer: Self-pay | Admitting: Obstetrics and Gynecology

## 2012-06-27 DIAGNOSIS — R928 Other abnormal and inconclusive findings on diagnostic imaging of breast: Secondary | ICD-10-CM

## 2012-07-05 ENCOUNTER — Ambulatory Visit
Admission: RE | Admit: 2012-07-05 | Discharge: 2012-07-05 | Disposition: A | Discharge status: Home / Self Care | Payer: BC Managed Care – PPO | Source: Ambulatory Visit | Attending: Obstetrics and Gynecology | Admitting: Obstetrics and Gynecology

## 2012-07-05 DIAGNOSIS — R928 Other abnormal and inconclusive findings on diagnostic imaging of breast: Secondary | ICD-10-CM

## 2012-07-10 ENCOUNTER — Other Ambulatory Visit: Payer: Self-pay | Admitting: Obstetrics and Gynecology

## 2012-07-10 DIAGNOSIS — R922 Inconclusive mammogram: Secondary | ICD-10-CM

## 2012-10-12 ENCOUNTER — Other Ambulatory Visit (INDEPENDENT_AMBULATORY_CARE_PROVIDER_SITE_OTHER): Payer: BC Managed Care – PPO

## 2012-10-12 DIAGNOSIS — Z0000 Encounter for general adult medical examination without abnormal findings: Secondary | ICD-10-CM

## 2012-10-12 LAB — LIPID PANEL
Cholesterol: 185 mg/dL (ref 0–200)
HDL: 52.4 mg/dL (ref >39.00)
Triglycerides: 58 mg/dL (ref 0.0–149.0)
VLDL: 11.6 mg/dL (ref 0.0–40.0)

## 2012-10-12 LAB — CBC WITH DIFFERENTIAL/PLATELET
Eosinophils Relative: 1.2 % (ref 0.0–5.0)
MCV: 94.9 fl (ref 78.0–100.0)
Monocytes Absolute: 0.4 10*3/uL (ref 0.1–1.0)
Neutrophils Relative %: 62.2 % (ref 43.0–77.0)
Platelets: 259 10*3/uL (ref 150.0–400.0)
WBC: 5.4 10*3/uL (ref 4.5–10.5)

## 2012-10-12 LAB — POCT URINALYSIS DIPSTICK
Bilirubin, UA: NEGATIVE
Ketones, UA: NEGATIVE
Leukocytes, UA: NEGATIVE
pH, UA: 7

## 2012-10-12 LAB — HEPATIC FUNCTION PANEL
ALT: 24 U/L (ref 0–35)
Bilirubin, Direct: 0.1 mg/dL (ref 0.0–0.3)
Total Bilirubin: 0.8 mg/dL (ref 0.3–1.2)

## 2012-10-12 LAB — BASIC METABOLIC PANEL
BUN: 15 mg/dL (ref 6–23)
Calcium: 9.5 mg/dL (ref 8.4–10.5)
Creatinine, Ser: 0.8 mg/dL (ref 0.4–1.2)
GFR: 73.86 mL/min (ref >60.00)

## 2012-10-19 ENCOUNTER — Encounter: Payer: Self-pay | Admitting: Family Medicine

## 2012-10-19 ENCOUNTER — Ambulatory Visit (INDEPENDENT_AMBULATORY_CARE_PROVIDER_SITE_OTHER): Payer: BC Managed Care – PPO | Admitting: Family Medicine

## 2012-10-19 VITALS — BP 126/78 | HR 80 | Temp 98.2°F | Wt 108.0 lb

## 2012-10-19 DIAGNOSIS — Z0000 Encounter for general adult medical examination without abnormal findings: Secondary | ICD-10-CM

## 2012-10-19 NOTE — Progress Notes (Signed)
  Subjective:    Patient ID: Tammy [name], female    DOB: 02-10-1951, 62 y.o.   MRN: [id number]  HPI Here for complete physical. Generally very healthy She has history of borderline osteoporosis. Followed by GYN. Getting regular Pap smears. Recent mammogram normal. Colonoscopy up to date. She exercises with tennis and walking. No history of shingles vaccine. Tetanus up-to-date  Past Medical History  Diagnosis Date  . Hyperlipidemia    No past surgical history on file.  reports that she quit smoking about 39 years ago. Her smoking use included Cigarettes. She has a 5 pack-year smoking history. She does not have any smokeless tobacco history on file. Her alcohol and drug histories are not on file. family history includes Cancer in her father, mother, and sister; Heart disease in her father; Heart disease (age of onset: 48) in her mother. Allergies  Allergen Reactions  . Penicillins       Review of Systems  Constitutional: Negative for fever, activity change, appetite change, fatigue and unexpected weight change.  HENT: Negative for hearing loss, ear pain, sore throat and trouble swallowing.   Eyes: Negative for visual disturbance.  Respiratory: Negative for cough and shortness of breath.   Cardiovascular: Negative for chest pain and palpitations.  Gastrointestinal: Negative for abdominal pain, diarrhea, constipation and blood in stool.  Genitourinary: Negative for dysuria and hematuria.  Musculoskeletal: Negative for myalgias, back pain and arthralgias.  Skin: Negative for rash.  Neurological: Negative for dizziness, syncope and headaches.  Hematological: Negative for adenopathy.  Psychiatric/Behavioral: Negative for confusion and dysphoric mood.       Objective:   Physical Exam  Constitutional: She is oriented to person, place, and time. She appears well-developed and well-nourished.  HENT:  Head: Normocephalic and atraumatic.  Eyes: EOM are normal. Pupils are equal,  round, and reactive to light.  Neck: Normal range of motion. Neck supple. No thyromegaly present.  Cardiovascular: Normal rate, regular rhythm and normal heart sounds.   No murmur heard. Pulmonary/Chest: Breath sounds normal. No respiratory distress. She has no wheezes. She has no rales.  Abdominal: Soft. Bowel sounds are normal. She exhibits no distension and no mass. There is no tenderness. There is no rebound and no guarding.  Genitourinary:  Per GYN  Musculoskeletal: Normal range of motion. She exhibits no edema.  Lymphadenopathy:    She has no cervical adenopathy.  Neurological: She is alert and oriented to person, place, and time. She displays normal reflexes. No cranial nerve deficit.  Skin: No rash noted.  Psychiatric: She has a normal mood and affect. Her behavior is normal. Judgment and thought content normal.          Assessment & Plan:  Healthy 62 year old female. Labs reviewed. No major abnormalities. Recommendation to consider flu vaccine but she's not received in the past. She will consider shingles vaccine and followup if interested.

## 2012-10-19 NOTE — Patient Instructions (Addendum)
Consider shingles vaccine.   Continue with regular weight bearing exercise and calcium and Vit D intake.

## 2012-12-28 ENCOUNTER — Other Ambulatory Visit: Payer: Self-pay

## 2013-08-09 ENCOUNTER — Other Ambulatory Visit: Payer: Self-pay

## 2013-08-09 DIAGNOSIS — Z1231 Encounter for screening mammogram for malignant neoplasm of breast: Secondary | ICD-10-CM

## 2013-08-13 ENCOUNTER — Ambulatory Visit
Admission: RE | Admit: 2013-08-13 | Discharge: 2013-08-13 | Disposition: A | Discharge status: Home / Self Care | Payer: BC Managed Care – PPO | Source: Ambulatory Visit

## 2013-08-13 DIAGNOSIS — Z1231 Encounter for screening mammogram for malignant neoplasm of breast: Secondary | ICD-10-CM

## 2013-12-24 ENCOUNTER — Ambulatory Visit (INDEPENDENT_AMBULATORY_CARE_PROVIDER_SITE_OTHER): Payer: BC Managed Care – PPO | Admitting: Family Medicine

## 2013-12-24 ENCOUNTER — Encounter: Payer: Self-pay | Admitting: Family Medicine

## 2013-12-24 VITALS — BP 130/78 | HR 94 | Temp 98.1°F | Wt 110.0 lb

## 2013-12-24 DIAGNOSIS — K649 Unspecified hemorrhoids: Secondary | ICD-10-CM

## 2013-12-24 NOTE — Progress Notes (Signed)
Pre visit review using our clinic review tool, if applicable. No additional management support is needed unless otherwise documented below in the visit note. 

## 2013-12-24 NOTE — Progress Notes (Signed)
   Subjective:    Patient ID: Tammy [name], female    DOB: Apr 18, 1950, 63 y.o.   MRN: [id number]  HPI Patient seen with some mild perianal pain for past few months. She has not had any history of hemorrhoids other than perhaps when she had one of her children. She has occasional constipation but no recent change of bowel habits. She's not seen any blood. No pain with bowel movements. She has occasional pain with sitting. She had colonoscopy 2012 internal hemorrhoids otherwise normal. No history of anal fissure. No recent appetite or weight changes.  Past Medical History  Diagnosis Date  . Hyperlipidemia    No past surgical history on file.  reports that she quit smoking about 40 years ago. Her smoking use included Cigarettes. She has a 5 pack-year smoking history. She does not have any smokeless tobacco history on file. Her alcohol and drug histories are not on file. family history includes Cancer in her father, mother, and sister; Heart disease in her father; Heart disease (age of onset: 36) in her mother. Allergies  Allergen Reactions  . Penicillins       Review of Systems  Constitutional: Negative for appetite change and unexpected weight change.  Gastrointestinal: Positive for rectal pain. Negative for abdominal pain, blood in stool and abdominal distention.       Objective:   Physical Exam  Constitutional: She appears well-developed and well-nourished.  Cardiovascular: Normal rate and regular rhythm.   Genitourinary:  No anal fissure. No external skin tags. She has some non-thrombosed hemorrhoids which are soft and minimally tender to palpation. No evidence for perianal abscess          Assessment & Plan:  Hemorrhoids. We've recommended sitz baths and measures to reduce constipation. She has already tried some topical hydrocortisone cream. We've offered referral to gastroenterology for further evaluation and treatment if these continue or become more painful and at this  point she wishes to observe.

## 2013-12-24 NOTE — Patient Instructions (Signed)

## 2014-04-23 ENCOUNTER — Other Ambulatory Visit (INDEPENDENT_AMBULATORY_CARE_PROVIDER_SITE_OTHER): Payer: 59

## 2014-04-23 DIAGNOSIS — Z0000 Encounter for general adult medical examination without abnormal findings: Secondary | ICD-10-CM

## 2014-04-23 LAB — LIPID PANEL
CHOLESTEROL: 207 mg/dL — AB (ref 0–200)
HDL: 58.8 mg/dL (ref >39.00)
LDL CALC: 136 mg/dL — AB (ref 0–99)
NONHDL: 148.2
Total CHOL/HDL Ratio: 4
Triglycerides: 63 mg/dL (ref 0.0–149.0)
VLDL: 12.6 mg/dL (ref 0.0–40.0)

## 2014-04-23 LAB — TSH: TSH: 1.54 u[IU]/mL (ref 0.35–4.50)

## 2014-04-23 LAB — CBC WITH DIFFERENTIAL/PLATELET
BASOS ABS: 0 10*3/uL (ref 0.0–0.1)
Basophils Relative: 0.8 % (ref 0.0–3.0)
EOS ABS: 0.1 10*3/uL (ref 0.0–0.7)
Eosinophils Relative: 1.1 % (ref 0.0–5.0)
HCT: 44.4 % (ref 36.0–46.0)
HEMOGLOBIN: 15.1 g/dL — AB (ref 12.0–15.0)
LYMPHS ABS: 2.2 10*3/uL (ref 0.7–4.0)
LYMPHS PCT: 38.2 % (ref 12.0–46.0)
MCHC: 34.1 g/dL (ref 30.0–36.0)
MCV: 94.4 fl (ref 78.0–100.0)
MONO ABS: 0.4 10*3/uL (ref 0.1–1.0)
MONOS PCT: 7.3 % (ref 3.0–12.0)
NEUTROS ABS: 3 10*3/uL (ref 1.4–7.7)
NEUTROS PCT: 52.6 % (ref 43.0–77.0)
PLATELETS: 279 10*3/uL (ref 150.0–400.0)
RBC: 4.7 Mil/uL (ref 3.87–5.11)
RDW: 12.8 % (ref 11.5–15.5)
WBC: 5.7 10*3/uL (ref 4.0–10.5)

## 2014-04-23 LAB — BASIC METABOLIC PANEL
BUN: 14 mg/dL (ref 6–23)
CHLORIDE: 104 meq/L (ref 96–112)
CO2: 27 mEq/L (ref 19–32)
Calcium: 9.6 mg/dL (ref 8.4–10.5)
Creatinine, Ser: 0.84 mg/dL (ref 0.40–1.20)
GFR: 72.49 mL/min (ref >60.00)
Glucose, Bld: 94 mg/dL (ref 70–99)
POTASSIUM: 4 meq/L (ref 3.5–5.1)
SODIUM: 139 meq/L (ref 135–145)

## 2014-04-23 LAB — HEPATIC FUNCTION PANEL
ALBUMIN: 4.3 g/dL (ref 3.5–5.2)
ALT: 16 U/L (ref 0–35)
AST: 20 U/L (ref 0–37)
Alkaline Phosphatase: 66 U/L (ref 39–117)
Bilirubin, Direct: 0.1 mg/dL (ref 0.0–0.3)
Total Bilirubin: 0.7 mg/dL (ref 0.2–1.2)
Total Protein: 7.3 g/dL (ref 6.0–8.3)

## 2014-05-01 ENCOUNTER — Encounter: Payer: Self-pay | Admitting: Family Medicine

## 2014-05-01 ENCOUNTER — Ambulatory Visit (INDEPENDENT_AMBULATORY_CARE_PROVIDER_SITE_OTHER): Payer: 59 | Admitting: Family Medicine

## 2014-05-01 VITALS — BP 128/70 | HR 76 | Temp 98.2°F | Ht 65.0 in | Wt 106.0 lb

## 2014-05-01 DIAGNOSIS — Z23 Encounter for immunization: Secondary | ICD-10-CM | POA: Diagnosis not present

## 2014-05-01 DIAGNOSIS — Z0000 Encounter for general adult medical examination without abnormal findings: Secondary | ICD-10-CM

## 2014-05-01 NOTE — Patient Instructions (Signed)
Check on insurance coverage for shingles vaccine Continue daily calcium 1200 mg and vitamin D 1000 international units Continue regular weightbearing exercise

## 2014-05-01 NOTE — Progress Notes (Signed)
Pre visit review using our clinic review tool, if applicable. No additional management support is needed unless otherwise documented below in the visit note. 

## 2014-05-01 NOTE — Progress Notes (Signed)
   Subjective:    Patient ID: Tammy [name], female    DOB: 1951/02/14, 64 y.o.   MRN: [id number]  HPI Patient seen for physical exam. She sees gynecologist regularly. Generally very healthy. Apparently had borderline osteoporosis in the past. She plans to get follow-up bone density possibly later this year with gynecologist. Last tetanus 10 years ago. No history of shingles vaccine. She does not get flu vaccines. Exercises regularly  Past Medical History  Diagnosis Date  . Hyperlipidemia    No past surgical history on file.  reports that she quit smoking about 40 years ago. Her smoking use included Cigarettes. She has a 5 pack-year smoking history. She does not have any smokeless tobacco history on file. Her alcohol and drug histories are not on file. family history includes Cancer in her father, mother, and sister; Heart disease in her father; Heart disease (age of onset: 66) in her mother. Allergies  Allergen Reactions  . Penicillins       Review of Systems  Constitutional: Negative for fever, activity change, appetite change, fatigue and unexpected weight change.  HENT: Negative for ear pain, hearing loss, sore throat and trouble swallowing.   Eyes: Negative for visual disturbance.  Respiratory: Negative for cough and shortness of breath.   Cardiovascular: Negative for chest pain and palpitations.  Gastrointestinal: Negative for abdominal pain, diarrhea, constipation and blood in stool.  Genitourinary: Negative for dysuria and hematuria.  Musculoskeletal: Negative for myalgias, back pain and arthralgias.  Skin: Negative for rash.  Neurological: Negative for dizziness, syncope and headaches.  Hematological: Negative for adenopathy.  Psychiatric/Behavioral: Negative for confusion and dysphoric mood.       Objective:   Physical Exam  Constitutional: She is oriented to person, place, and time. She appears well-developed and well-nourished.  HENT:  Head: Normocephalic and  atraumatic.  Bilateral cerumen impactions  Eyes: EOM are normal. Pupils are equal, round, and reactive to light.  Neck: Normal range of motion. Neck supple. No thyromegaly present.  Cardiovascular: Normal rate, regular rhythm and normal heart sounds.   No murmur heard. Pulmonary/Chest: Breath sounds normal. No respiratory distress. She has no wheezes. She has no rales.  Abdominal: Soft. Bowel sounds are normal. She exhibits no distension and no mass. There is no tenderness. There is no rebound and no guarding.  Genitourinary:  Per GYN  Musculoskeletal: Normal range of motion. She exhibits no edema.  Lymphadenopathy:    She has no cervical adenopathy.  Neurological: She is alert and oriented to person, place, and time. She displays normal reflexes. No cranial nerve deficit.  Skin: No rash noted.  Psychiatric: She has a normal mood and affect. Her behavior is normal. Judgment and thought content normal.          Assessment & Plan:  Complete physical. Tetanus booster given. Check on coverage for shingles vaccine. Irrigation of both ears for cerumen impaction. 2% 10 year risk of CAD event. Recommend daily calcium and Vit D supplement and regular weight bearing exercises.

## 2014-07-11 ENCOUNTER — Other Ambulatory Visit: Payer: Self-pay

## 2014-07-11 DIAGNOSIS — Z1231 Encounter for screening mammogram for malignant neoplasm of breast: Secondary | ICD-10-CM

## 2014-08-15 ENCOUNTER — Ambulatory Visit
Admission: RE | Admit: 2014-08-15 | Discharge: 2014-08-15 | Disposition: A | Discharge status: Home / Self Care | Payer: 59 | Source: Ambulatory Visit

## 2014-08-15 DIAGNOSIS — Z1231 Encounter for screening mammogram for malignant neoplasm of breast: Secondary | ICD-10-CM

## 2014-09-18 ENCOUNTER — Encounter: Payer: Self-pay | Admitting: Adult Health

## 2014-09-18 ENCOUNTER — Telehealth: Payer: Self-pay | Admitting: Family Medicine

## 2014-09-18 ENCOUNTER — Ambulatory Visit (INDEPENDENT_AMBULATORY_CARE_PROVIDER_SITE_OTHER): Payer: 59 | Admitting: Adult Health

## 2014-09-18 VITALS — BP 124/80 | HR 105 | Temp 99.1°F | Wt 113.1 lb

## 2014-09-18 DIAGNOSIS — R059 Cough, unspecified: Secondary | ICD-10-CM

## 2014-09-18 DIAGNOSIS — R05 Cough: Secondary | ICD-10-CM

## 2014-09-18 DIAGNOSIS — J040 Acute laryngitis: Secondary | ICD-10-CM | POA: Diagnosis not present

## 2014-09-18 MED ORDER — MAGIC MOUTHWASH W/LIDOCAINE: 5.0000 mL | Freq: Three times a day (TID) | ORAL | Status: DC | PRN

## 2014-09-18 NOTE — Telephone Encounter (Signed)
PLEASE NOTE: All timestamps contained within this report are represented as [place] Standard Time. CONFIDENTIALTY NOTICE: This fax transmission is intended only for the addressee. It contains information that is legally privileged, confidential or otherwise protected from use or disclosure. If you are not the intended recipient, you are strictly prohibited from reviewing, disclosing, copying using or disseminating any of this information or taking any action in reliance on or regarding this information. If you have received this fax in error, please notify us immediately by telephone so that we can arrange for its return to [place]. Phone: [phone], Toll-Free: [phone], Fax: [phone] Page: 1 of 1 Call Id: [id number] [facility] - Client [place] Patient Name: Tammy [name] DOB: [birth year] Initial Comment Caller States she has cough, post nasal drainage. been going on for the past few days. Nurse Assessment Nurse: [name], RN, Lynda Date/Time (Eastern Time): 09/18/2014 2:30:57 PM Confirm and document reason for call. If symptomatic, describe symptoms. ---Caller states she has cough, post nasal drainage which has been going on for the past few days. The cough has been keeping her from sleeping well. Is hoarse. Started out with a sore throat. No fever now. Was achy last week with low grade fever. Has the patient traveled out of the country within the last 30 days? ---Not Applicable Does the patient require triage? ---Yes Related visit to physician within the last 2 weeks? ---No Does the PT have any chronic conditions? (i.e. diabetes, asthma, etc.) ---Yes List chronic conditions. ---PAC's Guidelines Guideline Title Affirmed Question Affirmed Notes Cough - Acute Non-Productive SEVERE coughing spells (e.g., whooping sound after coughing, vomiting after coughing) Final Disposition User See Physician within [place], RN, [employer] states she feels a burning sensation sometimes going down to chest with the cough, not chest pain. Has had some significant coughing spells. Referrals REFERRED TO PCP OFFICE REFERRED TO PCP OFFICE Disagree/Comply: Comply

## 2014-09-18 NOTE — Progress Notes (Signed)
Pre visit review using our clinic review tool, if applicable. No additional management support is needed unless otherwise documented below in the visit note. 

## 2014-09-18 NOTE — Progress Notes (Signed)
Subjective:    Patient ID: Tammy [name], female    DOB: 14-Aug-1950, 64 y.o.   MRN: [id number]  HPI  64 year old female who presents to the office today for cough and laryngitis. She endorses that it started on Thursday with non productive cough. On Friday she began to feel achy. On Saturday her cough became worse and she continued to feel achy. The achy feeling went away on Sunday but the cough remained and she started to loss her voice. Since Sunday she has continued to have a cough and laryngitis. She has been drinking hot tea with honey which helps with the cough and laryngitis.   She denies any fever, no nausea or vomiting.    Review of Systems  Constitutional: Positive for fatigue. Negative for fever, chills and diaphoresis.  HENT: Positive for postnasal drip and voice change. Negative for congestion, ear discharge, ear pain, rhinorrhea, sinus pressure and sore throat.   Respiratory: Positive for cough. Negative for choking, shortness of breath and wheezing.   Cardiovascular: Negative.   Musculoskeletal: Negative.   Neurological: Negative.   All other systems reviewed and are negative.  Past Medical History  Diagnosis Date  . Hyperlipidemia     History   Social History  . Marital Status: Married    Spouse Name: N/A  . Number of Children: N/A  . Years of Education: N/A   Occupational History  . Not on file.   Social History Main Topics  . Smoking status: Former Smoker -- 1.00 packs/day for 5 years    Types: Cigarettes    Quit date: 05/26/1973  . Smokeless tobacco: Not on file  . Alcohol Use: Not on file  . Drug Use: Not on file  . Sexual Activity: Not on file   Other Topics Concern  . Not on file   Social History Narrative    No past surgical history on file.  Family History  Problem Relation Age of Onset  . Heart disease Mother 16    CABG  . Cancer Mother     bladder cancer  . Heart disease Father     MVP, atrial fibrilation  . Cancer Father    colon cancer  . Cancer Sister     colon cancer    Allergies  Allergen Reactions  . Penicillins     Current Outpatient Prescriptions on File Prior to Visit  Medication Sig Dispense Refill  . calcium carbonate (OS-CAL) 600 MG TABS Take 600 mg by mouth 2 (two) times daily with a meal.      . Cholecalciferol (VITAMIN D3) 1000 UNITS CAPS Take by mouth daily.       No current facility-administered medications on file prior to visit.    BP 124/80 mmHg  Pulse 105  Temp(Src) 99.1 F (37.3 C) (Oral)  Wt 113 lb 1.6 oz (51.302 kg)  SpO2 95%       Objective:   Physical Exam  Constitutional: She is oriented to person, place, and time. She appears well-developed and well-nourished.  HENT:  Head: Normocephalic and atraumatic.  Right Ear: External ear normal.  Left Ear: External ear normal.  Nose: Nose normal.  Mouth/Throat: Oropharynx is clear and moist. No oropharyngeal exudate.  TM's visualized  Eyes: Right eye exhibits no discharge. Left eye exhibits no discharge.  Cardiovascular: Normal rate, regular rhythm, normal heart sounds and intact distal pulses.  Exam reveals no gallop and no friction rub.   No murmur heard. Pulmonary/Chest: Effort normal and breath  sounds normal. No respiratory distress. She has no wheezes. She has no rales. She exhibits no tenderness.  Neurological: She is alert and oriented to person, place, and time.  Skin: Skin is warm and dry. No rash noted. No erythema. No pallor.  Psychiatric: She has a normal mood and affect. Her behavior is normal. Judgment and thought content normal.  Nursing note and vitals reviewed.     Assessment & Plan:  1. Cough - Alum & Mag Hydroxide-Simeth (MAGIC MOUTHWASH W/LIDOCAINE) SOLN; Take 5 mLs by mouth 3 (three) times daily as needed for mouth pain.  Dispense: 50 mL; Refill: 1 - She refused any other prescription cough medicines at this time.    2. Laryngitis - Likely viral syndrome. Would consider bacterial if she had a  fever greater than 101 or no symptoms improvement after 14 days.  - Alum & Mag Hydroxide-Simeth (MAGIC MOUTHWASH W/LIDOCAINE) SOLN; Take 5 mLs by mouth 3 (three) times daily as needed for mouth pain.  Dispense: 50 mL; Refill: 1 - Continue with hot tea and honey  - Voice rest - Follow up if no improvement in 2-3 days

## 2014-09-18 NOTE — Patient Instructions (Signed)
It was great meeting you today!   Gargle and spit the Magic Mouthwash three times a day as needed.   Continue with tea and honey.   Follow up if you do not notice any improvement in the next 2-3 days.

## 2014-09-19 NOTE — Telephone Encounter (Signed)
Patient seen by Tommi Rumps

## 2015-01-31 ENCOUNTER — Ambulatory Visit (INDEPENDENT_AMBULATORY_CARE_PROVIDER_SITE_OTHER): Payer: Medicare Other | Admitting: Family Medicine

## 2015-01-31 ENCOUNTER — Encounter: Payer: Self-pay | Admitting: Family Medicine

## 2015-01-31 VITALS — BP 120/82 | HR 83 | Temp 98.2°F | Resp 14 | Ht 65.0 in | Wt 115.7 lb

## 2015-01-31 DIAGNOSIS — Z23 Encounter for immunization: Secondary | ICD-10-CM | POA: Diagnosis not present

## 2015-01-31 DIAGNOSIS — R002 Palpitations: Secondary | ICD-10-CM | POA: Diagnosis not present

## 2015-01-31 NOTE — Patient Instructions (Signed)

## 2015-01-31 NOTE — Progress Notes (Signed)
   Subjective:    Patient ID: Tammy [name], female    DOB: 01-Aug-1950, 64 y.o.   MRN: [id number]  HPI Patient seen with complaints of intermittent palpitations- especially over the past month. She states about 15 years ago she had similar symptoms and underwent Holter monitor and echo which were basically unremarkable except for some PACs. Symptoms are somewhat similar. She had one episode in particular when she is playing tennis and remembers bending over and feeling slightly dizzy but no history of syncope. No chest pains. She does drink some caffeine in the form of tea in the morning. No regular prescription medications. Never treated with beta blockers. No regular alcohol use.  Her father had history of atrial fibrillation  Past Medical History  Diagnosis Date  . Hyperlipidemia    No past surgical history on file.  reports that she quit smoking about 41 years ago. Her smoking use included Cigarettes. She has a 5 pack-year smoking history. She does not have any smokeless tobacco history on file. Her alcohol and drug histories are not on file. family history includes Cancer in her father, mother, and sister; Heart disease in her father; Heart disease (age of onset: 73) in her mother. Allergies  Allergen Reactions  . Penicillins       Review of Systems  Constitutional: Negative for fatigue.  Eyes: Negative for visual disturbance.  Respiratory: Negative for cough, chest tightness, shortness of breath and wheezing.   Cardiovascular: Positive for palpitations. Negative for chest pain and leg swelling.  Neurological: Negative for seizures, syncope, weakness, light-headedness and headaches.       Objective:   Physical Exam  Constitutional: She appears well-developed and well-nourished.  Eyes: Pupils are equal, round, and reactive to light.  Neck: Neck supple. No JVD present. No thyromegaly present.  Cardiovascular: Normal rate and regular rhythm.  Exam reveals no gallop.     Pulmonary/Chest: Effort normal and breath sounds normal. No respiratory distress. She has no wheezes. She has no rales.  Musculoskeletal: She exhibits no edema.  Neurological: She is alert.          Assessment & Plan:  Palpitations. Suspect symptomatic PACs or PVCs. EKG shows sinus rhythm. V6 lead did not pick up.  Set up an event monitor to further assess. Minimize caffeine use. We discussed other potential triggers.  We discussed possible use of low-dose beta blocker for symptom control but at this point she wishes to observe. Follow-up immediately for any syncope or dizziness or new symptoms such as chest pains or dyspnea.

## 2015-02-04 ENCOUNTER — Ambulatory Visit (INDEPENDENT_AMBULATORY_CARE_PROVIDER_SITE_OTHER): Payer: 59

## 2015-02-04 DIAGNOSIS — R002 Palpitations: Secondary | ICD-10-CM | POA: Diagnosis not present

## 2015-02-15 ENCOUNTER — Emergency Department (HOSPITAL_COMMUNITY)
Admission: EM | Admit: 2015-02-15 | Discharge: 2015-02-15 | Disposition: A | Discharge status: Home / Self Care | Payer: 59 | Attending: Emergency Medicine | Admitting: Emergency Medicine

## 2015-02-15 ENCOUNTER — Encounter (HOSPITAL_COMMUNITY): Payer: Self-pay | Admitting: *Deleted

## 2015-02-15 DIAGNOSIS — Z79899 Other long term (current) drug therapy: Secondary | ICD-10-CM | POA: Insufficient documentation

## 2015-02-15 DIAGNOSIS — Y9289 Other specified places as the place of occurrence of the external cause: Secondary | ICD-10-CM | POA: Diagnosis not present

## 2015-02-15 DIAGNOSIS — S61211A Laceration without foreign body of left index finger without damage to nail, initial encounter: Secondary | ICD-10-CM | POA: Insufficient documentation

## 2015-02-15 DIAGNOSIS — S61219A Laceration without foreign body of unspecified finger without damage to nail, initial encounter: Secondary | ICD-10-CM

## 2015-02-15 DIAGNOSIS — Y9389 Activity, other specified: Secondary | ICD-10-CM | POA: Diagnosis not present

## 2015-02-15 DIAGNOSIS — Y998 Other external cause status: Secondary | ICD-10-CM | POA: Diagnosis not present

## 2015-02-15 DIAGNOSIS — Z8639 Personal history of other endocrine, nutritional and metabolic disease: Secondary | ICD-10-CM | POA: Insufficient documentation

## 2015-02-15 DIAGNOSIS — Z880 Allergy status to penicillin: Secondary | ICD-10-CM | POA: Insufficient documentation

## 2015-02-15 DIAGNOSIS — W291XXA Contact with electric knife, initial encounter: Secondary | ICD-10-CM | POA: Diagnosis not present

## 2015-02-15 DIAGNOSIS — Z87891 Personal history of nicotine dependence: Secondary | ICD-10-CM | POA: Insufficient documentation

## 2015-02-15 MED ORDER — LIDOCAINE HCL (PF) 1 % IJ SOLN
10.0000 mL | Freq: Once | INTRAMUSCULAR | Status: AC
  Administered 2015-02-15: 10 mL via INTRADERMAL
  Filled 2015-02-15: qty 10

## 2015-02-15 NOTE — Discharge Instructions (Signed)
Keep wound dry and do not remove dressing for 24 hours if possible. After that, wash gently morning and night (every 12 hours) with soap and water. Use a topical antibiotic ointment and cover with a bandaid or gauze.    Do NOT use rubbing alcohol or hydrogen peroxide, do not soak the area   After 7-10 days you can try to gently remove the outer part of the suture material with a tweezers. Your suture material is dissolving but the outside will not desolve.   Every attempt was made to remove foreign body (contaminants) from the wound.  However, there is always a chance that some may remain in the wound. This can  increase your risk of infection.   If you see signs of infection (warmth, redness, tenderness, pus, sharp increase in pain, fever, red streaking in the skin) immediately return to the emergency department.   After the wound heals fully, apply sunscreen for 6-12 months to minimize scarring.    Laceration Care, Adult A laceration is a cut that goes through all layers of the skin. The cut also goes into the tissue that is right under the skin. Some cuts heal on their own. Others need to be closed with stitches (sutures), staples, skin adhesive strips, or wound glue. Taking care of your cut lowers your risk of infection and helps your cut to heal better. HOW TO TAKE CARE OF YOUR CUT For stitches or staples:  Keep the wound clean and dry.  If you were given a bandage (dressing), you should change it at least one time per day or as told by your doctor. You should also change it if it gets wet or dirty.  Keep the wound completely dry for the first 24 hours or as told by your doctor. After that time, you may take a shower or a bath. However, make sure that the wound is not soaked in water until after the stitches or staples have been removed.  Clean the wound one time each day or as told by your doctor:  Wash the wound with soap and water.  Rinse the wound with water until all of the soap  comes off.  Pat the wound dry with a clean towel. Do not rub the wound.  After you clean the wound, put a thin layer of antibiotic ointment on it as told by your doctor. This ointment:  Helps to prevent infection.  Keeps the bandage from sticking to the wound.  Have your stitches or staples removed as told by your doctor. If your doctor used skin adhesive strips:   Keep the wound clean and dry.  If you were given a bandage, you should change it at least one time per day or as told by your doctor. You should also change it if it gets dirty or wet.  Do not get the skin adhesive strips wet. You can take a shower or a bath, but be careful to keep the wound dry.  If the wound gets wet, pat it dry with a clean towel. Do not rub the wound.  Skin adhesive strips fall off on their own. You can trim the strips as the wound heals. Do not remove any strips that are still stuck to the wound. They will fall off after a while. If your doctor used wound glue:  Try to keep your wound dry, but you may briefly wet it in the shower or bath. Do not soak the wound in water, such as by swimming.  After you take a shower or a bath, gently pat the wound dry with a clean towel. Do not rub the wound.  Do not do any activities that will make you really sweaty until the skin glue has fallen off on its own.  Do not apply liquid, cream, or ointment medicine to your wound while the skin glue is still on.  If you were given a bandage, you should change it at least one time per day or as told by your doctor. You should also change it if it gets dirty or wet.  If a bandage is placed over the wound, do not let the tape for the bandage touch the skin glue.  Do not pick at the glue. The skin glue usually stays on for 5-10 days. Then, it falls off of the skin. General Instructions  To help prevent scarring, make sure to cover your wound with sunscreen whenever you are outside after stitches are removed, after  adhesive strips are removed, or when wound glue stays in place and the wound is healed. Make sure to wear a sunscreen of at least 30 SPF.  Take over-the-counter and prescription medicines only as told by your doctor.  If you were given antibiotic medicine or ointment, take or apply it as told by your doctor. Do not stop using the antibiotic even if your wound is getting better.  Do not scratch or pick at the wound.  Keep all follow-up visits as told by your doctor. This is important.  Check your wound every day for signs of infection. Watch for:  Redness, swelling, or pain.  Fluid, blood, or pus.  Raise (elevate) the injured area above the level of your heart while you are sitting or lying down, if possible. GET HELP IF:  You got a tetanus shot and you have any of these problems at the injection site:  Swelling.  Very bad pain.  Redness.  Bleeding.  You have a fever.  A wound that was closed breaks open.  You notice a bad smell coming from your wound or your bandage.  You notice something coming out of the wound, such as [place] or glass.  Medicine does not help your pain.  You have more redness, swelling, or pain at the site of your wound.  You have fluid, blood, or pus coming from your wound.  You notice a change in the color of your skin near your wound.  You need to change the bandage often because fluid, blood, or pus is coming from the wound.  You start to have a new rash.  You start to have numbness around the wound. GET HELP RIGHT AWAY IF:  You have very bad swelling around the wound.  Your pain suddenly gets worse and is very bad.  You notice painful lumps near the wound or on skin that is anywhere on your body.  You have a red streak going away from your wound.  The wound is on your hand or foot and you cannot move a finger or toe like you usually can.  The wound is on your hand or foot and you notice that your fingers or toes look pale or  bluish.   This information is not intended to replace advice given to you by your health care provider. Make sure you discuss any questions you have with your health care provider.   Document Released: 07/28/2007 Document Revised: 06/25/2014 Document Reviewed: 02/04/2014 Elsevier Interactive Patient Education [employer].

## 2015-02-15 NOTE — ED Provider Notes (Signed)
CSN: [id number]     Arrival date & time 02/15/15  1513 History  By signing my name below, I, Stephania Fragmin, attest that this documentation has been prepared under the direction and in the presence of [employer], PA-C. Electronically Signed: Stephania Fragmin, ED Scribe. 02/15/2015. 4:45 PM.    Chief Complaint  Patient presents with  . Laceration    left hand, 2nd digit   The history is provided by the patient. No language interpreter was used.   HPI Comments: Tammy [name] is a 64 y.o. female with a history of HLD, who presents to the Emergency Department complaining of a laceration to her left second digit. She reports she was trying to remove blades off of an electric knife when she accidentally cut herself. She states she last had a tetanus vaccine earlier this year. Patient is right-hand-dominant.   Past Medical History  Diagnosis Date  . Hyperlipidemia    History reviewed. No pertinent past surgical history. Family History  Problem Relation Age of Onset  . Heart disease Mother 72    CABG  . Cancer Mother     bladder cancer  . Heart disease Father     MVP, atrial fibrilation  . Cancer Father     colon cancer  . Cancer Sister     colon cancer   Social History  Substance Use Topics  . Smoking status: Former Smoker -- 1.00 packs/day for 5 years    Types: Cigarettes    Quit date: 05/26/1973  . Smokeless tobacco: None  . Alcohol Use: Yes     Comment: 4 x's a wk a glass of wine   OB History    No data available     Review of Systems A complete 10 system review of systems was obtained and all systems are negative except as noted in the HPI and PMH.    Allergies  Penicillins  Home Medications   Prior to Admission medications   Medication Sig Start Date End Date Taking? Authorizing Provider  calcium carbonate (OS-CAL) 600 MG TABS Take 600 mg by mouth 2 (two) times daily with a meal.      Historical Provider, MD  Cholecalciferol (VITAMIN D3) 1000 UNITS CAPS Take by mouth  daily.      Historical Provider, MD   BP 136/73 mmHg  Pulse 76  Temp(Src) 97.6 F (36.4 C) (Oral)  Resp 16  SpO2 98% Physical Exam  Constitutional: She is oriented to person, place, and time. She appears well-developed and well-nourished. No distress.  HENT:  Head: Normocephalic and atraumatic.  Eyes: Conjunctivae and EOM are normal.  Neck: Neck supple. No tracheal deviation present.  Cardiovascular: Normal rate.   Pulmonary/Chest: Effort normal. No respiratory distress.  Musculoskeletal: Normal range of motion.  On the volar aspect of the left second digit DIP, there is a 1 cm full-thickness laceration that does not penetrate in to the joint. Distal sensation is intact, and patient has FROM.   Neurological: She is alert and oriented to person, place, and time.  Skin: Skin is warm and dry.  Psychiatric: She has a normal mood and affect. Her behavior is normal.  Nursing note and vitals reviewed.   ED Course  Procedures (including critical care time)  DIAGNOSTIC STUDIES: Oxygen Saturation is 98% on RA, normal by my interpretation.    COORDINATION OF CARE: 3:48 PM - Discussed treatment plan with pt at bedside which includes suture repair. Pt verbalized understanding and agreed to plan.   LACERATION  REPAIR PROCEDURE NOTE The patient's identification was confirmed and consent was obtained. This procedure was performed by Monico Blitz, PA-C, at 4:17 PM. Site: Left second digit Sterile procedures observed Anesthetic used (type and amt): Lidocaine 1%, Digital Block - 3 mL Suture type/size: 5-0 Vicryl Plus Length: 1 cm # of Sutures: 3 Technique: Simple interrupted Complexity Antibx ointment applied Tetanus UTD Site anesthetized, irrigated with NS, explored without evidence of foreign body, wound well approximated, site covered with dry, sterile dressing.  Patient tolerated procedure well without complications. Instructions for care discussed verbally and patient provided  with additional written instructions for homecare and f/u.   MDM   Final diagnoses:  Finger laceration, initial encounter    Filed Vitals:   02/15/15 1527 02/15/15 1640  BP: 136/73 120/73  Pulse: 76 72  Temp: 97.6 F (36.4 C) 98.5 F (36.9 C)  TempSrc: Oral Oral  Resp: 16 16  SpO2: 98% 98%    Medications  lidocaine (PF) (XYLOCAINE) 1 % injection 10 mL (10 mLs Intradermal Given 02/15/15 1638)    Tammy [name] is 64 y.o. female presenting with finger laceration.  No signs of tendon/joint involvement. Tdap UTD. Pressure irrigation performed. Laceration occurred < 8 hours prior to repair which was well tolerated. Pt has no co morbidities to effect normal wound healing. Discussed suture home care w pt and answered questions.  Pt is hemodynamically stable with no complaints prior to dc.   Evaluation does not show pathology that would require ongoing emergent intervention or inpatient treatment. Pt is hemodynamically stable and mentating appropriately. Discussed findings and plan with patient/guardian, who agrees with care plan. All questions answered. Return precautions discussed and outpatient follow up given.   I personally performed the services described in this documentation, which was scribed in my presence. The recorded information has been reviewed and is accurate.     Monico Blitz, PA-C 02/15/15 [place], MD 02/22/15 [phone]

## 2015-02-15 NOTE — ED Notes (Signed)
Pt presents with laceration to left hand, 2nd digit.  Bleeding controlled.  Pt stated "I was trying to get the blades off an electric knife."

## 2015-03-24 DIAGNOSIS — N952 Postmenopausal atrophic vaginitis: Secondary | ICD-10-CM | POA: Diagnosis not present

## 2015-03-24 DIAGNOSIS — N362 Urethral caruncle: Secondary | ICD-10-CM | POA: Diagnosis not present

## 2015-09-04 ENCOUNTER — Other Ambulatory Visit: Payer: Self-pay | Admitting: Obstetrics and Gynecology

## 2015-09-04 DIAGNOSIS — Z1231 Encounter for screening mammogram for malignant neoplasm of breast: Secondary | ICD-10-CM

## 2015-09-16 ENCOUNTER — Ambulatory Visit
Admission: RE | Admit: 2015-09-16 | Discharge: 2015-09-16 | Disposition: A | Discharge status: Home / Self Care | Payer: Medicare Other | Source: Ambulatory Visit | Attending: Obstetrics and Gynecology | Admitting: Obstetrics and Gynecology

## 2015-09-16 DIAGNOSIS — Z1231 Encounter for screening mammogram for malignant neoplasm of breast: Secondary | ICD-10-CM | POA: Diagnosis not present

## 2015-10-03 ENCOUNTER — Other Ambulatory Visit (INDEPENDENT_AMBULATORY_CARE_PROVIDER_SITE_OTHER): Payer: PPO

## 2015-10-03 DIAGNOSIS — Z0000 Encounter for general adult medical examination without abnormal findings: Secondary | ICD-10-CM | POA: Diagnosis not present

## 2015-10-03 LAB — CBC WITH DIFFERENTIAL/PLATELET
BASOS ABS: 0 10*3/uL (ref 0.0–0.1)
Basophils Relative: 0.6 % (ref 0.0–3.0)
Eosinophils Absolute: 0.1 10*3/uL (ref 0.0–0.7)
Eosinophils Relative: 1 % (ref 0.0–5.0)
HCT: 43.4 % (ref 36.0–46.0)
Hemoglobin: 14.7 g/dL (ref 12.0–15.0)
LYMPHS ABS: 1.8 10*3/uL (ref 0.7–4.0)
Lymphocytes Relative: 27.2 % (ref 12.0–46.0)
MCHC: 33.9 g/dL (ref 30.0–36.0)
MCV: 95.7 fl (ref 78.0–100.0)
MONO ABS: 0.5 10*3/uL (ref 0.1–1.0)
MONOS PCT: 7.2 % (ref 3.0–12.0)
NEUTROS ABS: 4.2 10*3/uL (ref 1.4–7.7)
NEUTROS PCT: 64 % (ref 43.0–77.0)
PLATELETS: 288 10*3/uL (ref 150.0–400.0)
RBC: 4.54 Mil/uL (ref 3.87–5.11)
RDW: 12.6 % (ref 11.5–15.5)
WBC: 6.6 10*3/uL (ref 4.0–10.5)

## 2015-10-03 LAB — HEPATIC FUNCTION PANEL
ALK PHOS: 60 U/L (ref 39–117)
ALT: 13 U/L (ref 0–35)
AST: 19 U/L (ref 0–37)
Albumin: 4.1 g/dL (ref 3.5–5.2)
BILIRUBIN DIRECT: 0.1 mg/dL (ref 0.0–0.3)
BILIRUBIN TOTAL: 0.7 mg/dL (ref 0.2–1.2)
Total Protein: 7 g/dL (ref 6.0–8.3)

## 2015-10-03 LAB — LIPID PANEL
CHOL/HDL RATIO: 3
Cholesterol: 202 mg/dL — ABNORMAL HIGH (ref 0–200)
HDL: 58.2 mg/dL (ref >39.00)
LDL CALC: 127 mg/dL — AB (ref 0–99)
NONHDL: 143.72
Triglycerides: 84 mg/dL (ref 0.0–149.0)
VLDL: 16.8 mg/dL (ref 0.0–40.0)

## 2015-10-03 LAB — BASIC METABOLIC PANEL
BUN: 16 mg/dL (ref 6–23)
CALCIUM: 9.3 mg/dL (ref 8.4–10.5)
CO2: 26 mEq/L (ref 19–32)
Chloride: 105 mEq/L (ref 96–112)
Creatinine, Ser: 0.77 mg/dL (ref 0.40–1.20)
GFR: 79.79 mL/min (ref >60.00)
Glucose, Bld: 86 mg/dL (ref 70–99)
POTASSIUM: 3.8 meq/L (ref 3.5–5.1)
SODIUM: 139 meq/L (ref 135–145)

## 2015-10-03 LAB — TSH: TSH: 1.94 u[IU]/mL (ref 0.35–4.50)

## 2015-10-06 DIAGNOSIS — Z681 Body mass index (BMI) 19 or less, adult: Secondary | ICD-10-CM | POA: Diagnosis not present

## 2015-10-06 DIAGNOSIS — Z124 Encounter for screening for malignant neoplasm of cervix: Secondary | ICD-10-CM | POA: Diagnosis not present

## 2015-10-13 ENCOUNTER — Encounter: Payer: Self-pay | Admitting: Family Medicine

## 2015-10-13 ENCOUNTER — Ambulatory Visit (INDEPENDENT_AMBULATORY_CARE_PROVIDER_SITE_OTHER): Payer: PPO | Admitting: Family Medicine

## 2015-10-13 VITALS — BP 122/78 | HR 83 | Temp 98.3°F | Ht 65.0 in | Wt 114.5 lb

## 2015-10-13 DIAGNOSIS — Z23 Encounter for immunization: Secondary | ICD-10-CM

## 2015-10-13 DIAGNOSIS — Z0000 Encounter for general adult medical examination without abnormal findings: Secondary | ICD-10-CM

## 2015-10-13 MED ORDER — LORAZEPAM 0.5 MG PO TABS: ORAL_TABLET | ORAL | 0 refills | Status: DC

## 2015-10-13 NOTE — Progress Notes (Signed)
Pre visit review using our clinic review tool, if applicable. No additional management support is needed unless otherwise documented below in the visit note. 

## 2015-10-13 NOTE — Patient Instructions (Signed)
Consider shingles vaccine at some point later this year.

## 2015-10-13 NOTE — Progress Notes (Signed)
Subjective:     Patient ID: Tammy [name], female   DOB: September 17, 1950, 65 y.o.   MRN: [id number]  HPI   Patient here for physical. She continues to see gynecologist. She's not had recent DEXA scan. No history of Prevnar 13. Just turned 65. No history of shingles vaccine. She is due for repeat colonoscopy this year secondary to family history. She still plays tennis regularly. Takes regular calcium and vitamin D.  She has declined DEXA scan in past stating that she would not want to take any bisphosphonates.    Past Medical History:  Diagnosis Date  . Hyperlipidemia    Past Surgical History:  Procedure Laterality Date  . DILATION AND CURETTAGE OF UTERUS N/A     reports that she quit smoking about 42 years ago. Her smoking use included Cigarettes. She has a 5.00 pack-year smoking history. She has never used smokeless tobacco. She reports that she drinks alcohol. Her drug history is not on file. family history includes Cancer in her father, mother, and sister; Heart disease in her father; Heart disease (age of onset: 69) in her mother. Allergies  Allergen Reactions  . Contrast Media [Iodinated Diagnostic Agents] Itching    Eye Swelling  . Penicillins      Review of Systems  Constitutional: Negative for activity change, appetite change, fatigue, fever and unexpected weight change.  HENT: Negative for ear pain, hearing loss, sore throat and trouble swallowing.   Eyes: Negative for visual disturbance.  Respiratory: Negative for cough and shortness of breath.   Cardiovascular: Negative for chest pain and palpitations.  Gastrointestinal: Negative for abdominal pain, blood in stool, constipation and diarrhea.  Genitourinary: Negative for dysuria and hematuria.  Musculoskeletal: Negative for arthralgias, back pain and myalgias.  Skin: Negative for rash.  Neurological: Negative for dizziness, syncope and headaches.  Hematological: Negative for adenopathy.  Psychiatric/Behavioral: Negative  for confusion and dysphoric mood.       Objective:   Physical Exam  Constitutional: She is oriented to person, place, and time. She appears well-developed and well-nourished.  HENT:  Head: Normocephalic and atraumatic.  Eyes: EOM are normal. Pupils are equal, round, and reactive to light.  Neck: Normal range of motion. Neck supple. No thyromegaly present.  Cardiovascular: Normal rate, regular rhythm and normal heart sounds.   No murmur heard. Pulmonary/Chest: Breath sounds normal. No respiratory distress. She has no wheezes. She has no rales.  Abdominal: Soft. Bowel sounds are normal. She exhibits no distension and no mass. There is no tenderness. There is no rebound and no guarding.  Musculoskeletal: Normal range of motion. She exhibits no edema.  Lymphadenopathy:    She has no cervical adenopathy.  Neurological: She is alert and oriented to person, place, and time. She displays normal reflexes. No cranial nerve deficit.  Skin: No rash noted.  Psychiatric: She has a normal mood and affect. Her behavior is normal. Judgment and thought content normal.       Assessment:     Physical exam. Needs Prevnar 13 and also no history of shingles vaccine. She is due for repeat colonoscopy At risk for osteoporosis    Plan:     -Prevnar 13 given and recommended Pneumovax in 1 year -Consider shingles vaccine this year-she did not want to receive this today. -Set up repeat colonoscopy -Discuss with her GYN getting follow-up DEXA scan this year -labs reviewed with no major concerns -continue daily calcium and Vit D  Eulas Post MD [facility]

## 2016-01-13 ENCOUNTER — Ambulatory Visit (INDEPENDENT_AMBULATORY_CARE_PROVIDER_SITE_OTHER): Payer: PPO

## 2016-01-13 DIAGNOSIS — Z23 Encounter for immunization: Secondary | ICD-10-CM

## 2016-01-20 ENCOUNTER — Encounter (HOSPITAL_COMMUNITY): Payer: Self-pay | Admitting: Emergency Medicine

## 2016-01-20 ENCOUNTER — Emergency Department (HOSPITAL_COMMUNITY)
Admission: EM | Admit: 2016-01-20 | Discharge: 2016-01-21 | Disposition: A | Discharge status: Home / Self Care | Payer: PPO | Attending: Emergency Medicine | Admitting: Emergency Medicine

## 2016-01-20 DIAGNOSIS — I4891 Unspecified atrial fibrillation: Secondary | ICD-10-CM | POA: Diagnosis not present

## 2016-01-20 DIAGNOSIS — Z87891 Personal history of nicotine dependence: Secondary | ICD-10-CM | POA: Diagnosis not present

## 2016-01-20 DIAGNOSIS — R002 Palpitations: Secondary | ICD-10-CM | POA: Diagnosis not present

## 2016-01-20 DIAGNOSIS — R000 Tachycardia, unspecified: Secondary | ICD-10-CM | POA: Diagnosis not present

## 2016-01-20 LAB — CBC WITH DIFFERENTIAL/PLATELET
Basophils Absolute: 0 10*3/uL (ref 0.0–0.1)
Basophils Relative: 1 %
Eosinophils Absolute: 0.1 10*3/uL (ref 0.0–0.7)
Eosinophils Relative: 1 %
HCT: 44.7 % (ref 36.0–46.0)
Hemoglobin: 15.2 g/dL — ABNORMAL HIGH (ref 12.0–15.0)
Lymphocytes Relative: 34 %
Lymphs Abs: 2.4 10*3/uL (ref 0.7–4.0)
MCH: 31.9 pg (ref 26.0–34.0)
MCHC: 34 g/dL (ref 30.0–36.0)
MCV: 93.9 fL (ref 78.0–100.0)
Monocytes Absolute: 0.5 10*3/uL (ref 0.1–1.0)
Monocytes Relative: 7 %
Neutro Abs: 4.1 10*3/uL (ref 1.7–7.7)
Neutrophils Relative %: 57 %
Platelets: 304 10*3/uL (ref 150–400)
RBC: 4.76 MIL/uL (ref 3.87–5.11)
RDW: 13.3 % (ref 11.5–15.5)
WBC: 7.2 10*3/uL (ref 4.0–10.5)

## 2016-01-20 LAB — COMPREHENSIVE METABOLIC PANEL
ALBUMIN: 4.3 g/dL (ref 3.5–5.0)
ALK PHOS: 66 U/L (ref 38–126)
ALT: 17 U/L (ref 14–54)
AST: 25 U/L (ref 15–41)
Anion gap: 8 (ref 5–15)
BUN: 12 mg/dL (ref 6–20)
CALCIUM: 9.4 mg/dL (ref 8.9–10.3)
CO2: 26 mmol/L (ref 22–32)
CREATININE: 0.89 mg/dL (ref 0.44–1.00)
Chloride: 109 mmol/L (ref 101–111)
GFR calc Af Amer: >60 mL/min (ref >60)
GFR calc non Af Amer: >60 mL/min (ref >60)
GLUCOSE: 122 mg/dL — AB (ref 65–99)
Potassium: 3.5 mmol/L (ref 3.5–5.1)
SODIUM: 143 mmol/L (ref 135–145)
Total Bilirubin: 0.3 mg/dL (ref 0.3–1.2)
Total Protein: 7.5 g/dL (ref 6.5–8.1)

## 2016-01-20 LAB — I-STAT TROPONIN, ED: Troponin i, poc: 0 ng/mL (ref 0.00–0.08)

## 2016-01-20 NOTE — ED Triage Notes (Signed)
Pt to ED with c/o rapid heartbeat.  Onset approx 7pm tonight.  Pt denies any chest pain or shortness of breath

## 2016-01-21 LAB — TSH: TSH: 3.898 u[IU]/mL (ref 0.350–4.500)

## 2016-01-21 MED ORDER — RIVAROXABAN (XARELTO) EDUCATION KIT FOR AFIB PATIENTS
PACK | Freq: Once | Status: DC
  Filled 2016-01-21: qty 1

## 2016-01-21 MED ORDER — RIVAROXABAN 20 MG PO TABS: 20.0000 mg | ORAL_TABLET | Freq: Every day | ORAL | 0 refills | Status: DC

## 2016-01-21 MED ORDER — RIVAROXABAN 20 MG PO TABS
20.0000 mg | ORAL_TABLET | Freq: Once | ORAL | Status: AC
  Administered 2016-01-21: 20 mg via ORAL
  Filled 2016-01-21: qty 1

## 2016-01-21 MED ORDER — METOPROLOL TARTRATE 25 MG PO TABS: 25.0000 mg | ORAL_TABLET | ORAL | 0 refills | Status: DC | PRN

## 2016-01-21 NOTE — Discharge Instructions (Signed)
Take Xarelto daily. Take metoprolol only if experiencing symptoms of palpitations or heart racing. Follow up with cardiology in 1 week for further evaluation and echo. Return to the ED if you experience severe worsening of your symptoms, chest pain, difficulty breathing, loss of consciousness, weakness.

## 2016-01-21 NOTE — ED Provider Notes (Signed)
[place] DEPT Provider Note   CSN: [id number] Arrival date & time: 01/20/16  2027     History   Chief Complaint Chief Complaint  Patient presents with  . Tachycardia    HPI Tammy [name] is a 65 y.o. female with no significant past medical history who presents to the ED today complaining of palpitations. Patient states around 7 PM today she was sitting on the couch watching TV when she began feeling like her heart was racing. She states that she has very similar symptoms in the past because she was previously diagnosed with premature atrial complexes. She states that typically spontaneously resolves after coughing or bearing down. However, this time the palpitations persisted. She checked her heart rate and was over 140. She went to urgent care for further evaluation and they sent her to the ED. She did not have any associated chest pain, shortness of breath, dizziness, paresthesias, nausea or vomiting. She states her symptoms lasted approximately 2-3 hours and spontaneously resolved while in the emergency department waiting room.  HPI  Past Medical History:  Diagnosis Date  . Hyperlipidemia     Patient Active Problem List   Diagnosis Date Noted  . HYPERLIPIDEMIA 01/26/2010  . MIGRAINE HEADACHE 01/26/2010  . PREMATURE ATRIAL CONTRACTIONS 01/26/2010  . OSTEOPOROSIS 01/26/2010    Past Surgical History:  Procedure Laterality Date  . DILATION AND CURETTAGE OF UTERUS N/A     OB History    No data available       Home Medications    Prior to Admission medications   Medication Sig Start Date End Date Taking? Authorizing Provider  calcium carbonate (OS-CAL) 600 MG TABS Take 600 mg by mouth 2 (two) times daily with a meal.      Historical Provider, MD  Cholecalciferol (VITAMIN D3) 1000 UNITS CAPS Take by mouth daily.      Historical Provider, MD  LORazepam (ATIVAN) 0.5 MG tablet Take one tablet one hour prior to flight 10/13/15   Eulas Post, MD    Family  History Family History  Problem Relation Age of Onset  . Heart disease Mother 77    CABG  . Cancer Mother     bladder cancer  . Heart disease Father     MVP, atrial fibrilation  . Cancer Father     colon cancer  . Cancer Sister     colon cancer    Social History Social History  Substance Use Topics  . Smoking status: Former Smoker    Packs/day: 1.00    Years: 5.00    Types: Cigarettes    Quit date: 05/26/1973  . Smokeless tobacco: Never Used  . Alcohol use Yes     Comment: 4 x's a wk a glass of wine     Allergies   Contrast media [iodinated diagnostic agents] and Penicillins   Review of Systems Review of Systems  All other systems reviewed and are negative.    Physical Exam Updated Vital Signs BP 127/74 (BP Location: Right Arm)   Pulse 87   Temp 98.5 F (36.9 C) (Oral)   Resp 16   Ht 5\' 5"  (1.651 m)   Wt 53.1 kg   SpO2 98%   BMI 19.47 kg/m   Physical Exam  Constitutional: She is oriented to person, place, and time. She appears well-developed and well-nourished. No distress.  HENT:  Head: Normocephalic and atraumatic.  Mouth/Throat: No oropharyngeal exudate.  Eyes: Conjunctivae and EOM are normal. Pupils are equal, round, and reactive  to light. Right eye exhibits no discharge. Left eye exhibits no discharge. No scleral icterus.  Cardiovascular: Normal rate, regular rhythm, normal heart sounds and intact distal pulses.  Exam reveals no gallop and no friction rub.   No murmur heard. Pulmonary/Chest: Effort normal and breath sounds normal. No respiratory distress. She has no wheezes. She has no rales. She exhibits no tenderness.  Abdominal: Soft. She exhibits no distension. There is no tenderness. There is no guarding.  Musculoskeletal: Normal range of motion. She exhibits no edema.  Neurological: She is alert and oriented to person, place, and time.  Skin: Skin is warm and dry. No rash noted. She is not diaphoretic. No erythema. No pallor.  Psychiatric: She  has a normal mood and affect. Her behavior is normal.  Nursing note and vitals reviewed.    ED Treatments / Results  Labs (all labs ordered are listed, but only abnormal results are displayed) Labs Reviewed  CBC WITH DIFFERENTIAL/PLATELET - Abnormal; Notable for the following:       Result Value   Hemoglobin 15.2 (*)    All other components within normal limits  COMPREHENSIVE METABOLIC PANEL - Abnormal; Notable for the following:    Glucose, Bld 122 (*)    All other components within normal limits  TSH  I-STAT TROPOININ, ED    EKG  EKG Interpretation  Date/Time:  Tuesday January 20 2016 20:40:45 EST Ventricular Rate:  141 PR Interval:  168 QRS Duration: 76 QT Interval:  288 QTC Calculation: 441 R Axis:   87 Text Interpretation:  Atrial flutter 2:1 av conduction Anteroseptal infarct , age undetermined Abnormal ECG Confirmed by Jeneen Rinks  MD, [place] ([id number]) on 01/20/2016 11:25:09 PM       Radiology No results found.  Procedures Procedures (including critical care time)  Medications Ordered in ED Medications - No data to display   Initial Impression / Assessment and Plan / ED Course  I have reviewed the triage vital signs and the nursing notes.  Pertinent labs & imaging results that were available during my care of the patient were reviewed by me and considered in my medical decision making (see chart for details).  Clinical Course    Otherwise healthy 65 year old female presents to the ED today complaining of acute onset episode of palpitations that last approximately 2-3 hours before spontaneously resolving. On arrival to the ED she had a heart rate of 145 while out in the waiting room. EKG performed in triage questionable for 2-1 atrial flutter. Once patient was back in exam room her symptoms had resolved and her heart rate is now in the 80s. She is currently completely asymptomatic. 12:05 AM Spoke with Dr. Eula Fried with cardiology who was able to view pts EKG and  states that this is likely course atrial fibrillation. Given that pts CHADSVASC score is 2 he also recommends initiating Xarelto 20mg  daily as well as metoprolol 25 mg prn when symptomatic, cardiology follow up in 1 week for echo.  Discussed treatment plan with patient who expresses understanding and appears reliable for follow-up.Return precautions outlined in patient discharge instructions.   Patient was discussed with and seen by Dr. Tomi Bamberger who agrees with the treatment plan.    Final Clinical Impressions(s) / ED Diagnoses   Final diagnoses:  Atrial fibrillation, unspecified type [facility])    New Prescriptions New Prescriptions   No medications on file     Carlos Levering, PA-C 01/21/16 0100    Rolland Porter, MD 01/21/16 [phone]

## 2016-01-21 NOTE — ED Provider Notes (Signed)
Pt relates hx of PAC's and tonight has persistent palpitations from 7 pm until 10 pm for the first time. No chest pain, SOB, diaphoresis. FOP has hx of atrial fib, her last echo was about 15 years ago.  Pt seen after converted to NSR, she is in NAD.   Medical screening examination/treatment/procedure(s) were conducted as a shared visit with non-physician practitioner(s) and myself.  I personally evaluated the patient during the encounter.   EKG Interpretation  Date/Time:  Tuesday January 20 2016 20:40:45 EST Ventricular Rate:  141 PR Interval:  168 QRS Duration: 76 QT Interval:  288 QTC Calculation: 441 R Axis:   87 Text Interpretation:  Atrial flutter 2:1 av conduction Anteroseptal infarct , age undetermined Abnormal ECG Confirmed by Jeneen Rinks  MD, [place] ([id number]) on 01/20/2016 11:25:09 PM       EKG Interpretation  Date/Time:  Wednesday January 21 2016 00:36:48 EST Ventricular Rate:  73 PR Interval:  168 QRS Duration: 100 QT Interval:  400 QTC Calculation: 441 R Axis:   79 Text Interpretation:  Sinus rhythm Biatrial enlargement Anterior infarct, old Since last tracing of earlier today Normal sinus rhythm has replaced Atrial flutter Confirmed by Shaquil Aldana  MD-I, Joslin Doell ([id number]) on 01/21/2016 12:46:04 AM       Medical screening examination/treatment/procedure(s) were conducted as a shared visit with non-physician practitioner(s) and myself.  I personally evaluated the patient during the encounter.    Rolland Porter, MD, Barbette Or, MD 01/21/16 [phone]

## 2016-01-21 NOTE — ED Notes (Signed)
Pt stable, understands discharge instructions, and reasons for return.   

## 2016-01-22 NOTE — Progress Notes (Signed)
Cardiology Office Note    Date:  01/27/2016   ID:  Tammy [name], Tammy [name] 1950-05-31, MRN [id number]  PCP:  Eulas Post, MD  Cardiologist:  New (Dr. Marlou Porch)  Chief Complaint: Afib/aflutter  History of Present Illness:   Tammy [name] is a 65 y.o. female with on significant PMH other than hyperlipidemia and PACs who seen in ER for palpitation   Prior hx of PACs > 15 years (echo normal at that time) that resolved with cough or bending down. 30 days event monitor 12/16 showed NSR with sinus tachy and bradycardia. No sustained arrhythmias. Not placed on BB due to bradycardia.   The went to ER late night of 01/20/16 with 2-3 hours hx of "heart racing". Symptoms started while watching TV. Upon arrival in ER, triage EKG read as atrial flutter 2:1 at rate of 141 bpm. The converted to sinus rhythm when brought back in exam room. Discussed with on call fellow Dr. Eula Fried who read EKG as afib. Recommended Xarelto 20mg  qd for CHADSVASCs score of 2 (age and sex). PRN metoprolol 25mg  and cardiology f/u in 1 week with echo.   Here for follow up. No reoccurrence. She used to drink large cup of caffeinated coffee every day, discontinued since ER visit. Social drinker. The patient denies nausea, vomiting, fever, chest pain, shortness of breath, orthopnea, PND, dizziness, snoring, syncope, cough, congestion, abdominal pain, hematochezia, melena, lower extremity edema. She plans tannis multiple times in a week without any discomfort.   Father has afib and MV prolapse --> stoke (not on anticoagulation at that time) Mother has CABG in 66s.   Past Medical History:  Diagnosis Date  . Hyperlipidemia     Past Surgical History:  Procedure Laterality Date  . DILATION AND CURETTAGE OF UTERUS N/A     Current Medications: Prior to Admission medications   Medication Sig Start Date End Date Taking? Authorizing Provider  calcium carbonate (OS-CAL) 600 MG TABS Take 600 mg by mouth 2 (two) times daily with a  meal.      Historical Provider, MD  Cholecalciferol (VITAMIN D3) 1000 UNITS CAPS Take by mouth daily.      Historical Provider, MD  LORazepam (ATIVAN) 0.5 MG tablet Take one tablet one hour prior to flight 10/13/15   Eulas Post, MD  metoprolol tartrate (LOPRESSOR) 25 MG tablet Take 1 tablet (25 mg total) by mouth as needed. 01/21/16   Samantha Tripp Dowless, PA-C  rivaroxaban (XARELTO) 20 MG TABS tablet Take 1 tablet (20 mg total) by mouth daily with supper. 01/21/16   Samantha Tripp Dowless, PA-C    Allergies:   Contrast media [iodinated diagnostic agents] and Penicillins   Social History   Social History  . Marital status: Married    Spouse name: N/A  . Number of children: N/A  . Years of education: N/A   Social History Main Topics  . Smoking status: Former Smoker    Packs/day: 1.00    Years: 5.00    Types: Cigarettes    Quit date: 05/26/1973  . Smokeless tobacco: Never Used  . Alcohol use Yes     Comment: 4 x's a wk a glass of wine  . Drug use: Unknown  . Sexual activity: Not on file   Other Topics Concern  . Not on file   Social History Narrative  . No narrative on file     Family History:  The patient's family history includes Cancer in her father, mother, and sister; Heart disease in  her father; Heart disease (age of onset: 59) in her mother.   ROS:   Please see the history of present illness.    ROS All other systems reviewed and are negative.   PHYSICAL EXAM:   VS:  BP 132/74 (BP Location: Right Arm)   Pulse 66   Ht 5' 5.5" (1.664 m)   Wt 114 lb (51.7 kg)   BMI 18.68 kg/m    GEN: Well nourished, well developed, in no acute distress  HEENT: normal  Neck: no JVD, carotid bruits, or masses Cardiac:RRR; no murmurs, rubs, or gallops,no edema  Respiratory:  clear to auscultation bilaterally, normal work of breathing GI: soft, nontender, nondistended, + BS MS: no deformity or atrophy  Skin: warm and dry, no rash Neuro:  Alert and Oriented x 3, Strength  and sensation are intact Psych: euthymic mood, full affect  Wt Readings from Last 3 Encounters:  01/27/16 114 lb (51.7 kg)  01/20/16 117 lb (53.1 kg)  10/13/15 114 lb 8 oz (51.9 kg)      Studies/Labs Reviewed:   EKG:  EKG is ordered today.  The ekg ordered today demonstrates NSR at rate of 66 bpm.   Recent Labs: 01/20/2016: ALT 17; BUN 12; Creatinine, Ser 0.89; Hemoglobin 15.2; Platelets 304; Potassium 3.5; Sodium 143; TSH 3.898   Lipid Panel    Component Value Date/Time   CHOL 202 (H) 10/03/2015 0811   TRIG 84.0 10/03/2015 0811   HDL 58.20 10/03/2015 0811   CHOLHDL 3 10/03/2015 0811   VLDL 16.8 10/03/2015 0811   LDLCALC 127 (H) 10/03/2015 0811   LDLDIRECT 142.6 03/04/2010 1001    Additional studies/ records that were reviewed today include:   As above   ASSESSMENT & PLAN:    1. PAF - No recurrence. She has stopped drinking Caffeinated product. Continue Xarelto for anticoagulation for CHADSVASCS score of 2 for sex and age. PRN metoprolol. Will get echo. In sinus rhythm today. TSH normal in ER. Discussed with DOD (Dr.Skains).   2. HLD - Diet controlled. 10/03/2015: Cholesterol 202; HDL 58.20; LDL Cholesterol 127; Triglycerides 84.0; VLDL 16.8  - Not interested in stain currently. She will benefit with low dose statin.    Medication Adjustments/Labs and Tests Ordered: Current medicines are reviewed at length with the patient today.  Concerns regarding medicines are outlined above.  Medication changes, Labs and Tests ordered today are listed in the Patient Instructions below. Patient Instructions  Medication Instructions:   Your physician recommends that you continue on your current medications as directed. Please refer to the Current Medication list given to you today.    Testing/Procedures:  Your physician has requested that you have an echocardiogram. Echocardiography is a painless test that uses sound waves to create images of your heart. It provides your  doctor with information about the size and shape of your heart and how well your heart's chambers and valves are working. This procedure takes approximately one hour. There are no restrictions for this procedure.     Follow-Up:  2 MONTHS WITH DR Marlou Porch     If you need a refill on your cardiac medications before your next appointment, please call your pharmacy.      Jarrett Soho, [place]  01/27/2016 10:19 AM    [facility] [address], [place],   [address] Phone: [phone]; Fax: [phone]

## 2016-01-27 ENCOUNTER — Ambulatory Visit (INDEPENDENT_AMBULATORY_CARE_PROVIDER_SITE_OTHER): Payer: PPO | Admitting: Physician Assistant

## 2016-01-27 VITALS — BP 132/74 | HR 66 | Ht 65.5 in | Wt 114.0 lb

## 2016-01-27 DIAGNOSIS — I480 Paroxysmal atrial fibrillation: Secondary | ICD-10-CM

## 2016-01-27 DIAGNOSIS — E782 Mixed hyperlipidemia: Secondary | ICD-10-CM

## 2016-01-27 DIAGNOSIS — I4891 Unspecified atrial fibrillation: Secondary | ICD-10-CM

## 2016-01-27 NOTE — Patient Instructions (Signed)
Medication Instructions:   Your physician recommends that you continue on your current medications as directed. Please refer to the Current Medication list given to you today.    Testing/Procedures:  Your physician has requested that you have an echocardiogram. Echocardiography is a painless test that uses sound waves to create images of your heart. It provides your doctor with information about the size and shape of your heart and how well your heart's chambers and valves are working. This procedure takes approximately one hour. There are no restrictions for this procedure.     Follow-Up:  2 MONTHS WITH DR Marlou Porch     If you need a refill on your cardiac medications before your next appointment, please call your pharmacy.

## 2016-01-28 ENCOUNTER — Ambulatory Visit (INDEPENDENT_AMBULATORY_CARE_PROVIDER_SITE_OTHER): Payer: PPO | Admitting: Family Medicine

## 2016-01-28 DIAGNOSIS — I480 Paroxysmal atrial fibrillation: Secondary | ICD-10-CM

## 2016-01-28 DIAGNOSIS — I4891 Unspecified atrial fibrillation: Secondary | ICD-10-CM | POA: Insufficient documentation

## 2016-01-28 NOTE — Patient Instructions (Signed)
Atrial Fibrillation Atrial fibrillation is a type of irregular or rapid heartbeat (arrhythmia). In atrial fibrillation, the heart quivers continuously in a chaotic pattern. This occurs when parts of the heart receive disorganized signals that make the heart unable to pump blood normally. This can increase the risk for stroke, heart failure, and other heart-related conditions. There are different types of atrial fibrillation, including:  Paroxysmal atrial fibrillation. This type starts suddenly, and it usually stops on its own shortly after it starts.  Persistent atrial fibrillation. This type often lasts longer than a week. It may stop on its own or with treatment.  Long-lasting persistent atrial fibrillation. This type lasts longer than 12 months.  Permanent atrial fibrillation. This type does not go away.  Talk with your health care provider to learn about the type of atrial fibrillation that you have. What are the causes? This condition is caused by some heart-related conditions or procedures, including:  A heart attack.  Coronary artery disease.  Heart failure.  Heart valve conditions.  High blood pressure.  Inflammation of the sac that surrounds the heart (pericarditis).  Heart surgery.  Certain heart rhythm disorders, such as Wolf-Parkinson-White syndrome.  Other causes include:  Pneumonia.  Obstructive sleep apnea.  Blockage of an artery in the lungs (pulmonary embolism, or PE).  Lung cancer.  Chronic lung disease.  Thyroid problems, especially if the thyroid is overactive (hyperthyroidism).  Caffeine.  Excessive alcohol use or illegal drug use.  Use of some medicines, including certain decongestants and diet pills.  Sometimes, the cause cannot be found. What increases the risk? This condition is more likely to develop in:  People who are older in age.  People who smoke.  People who have diabetes mellitus.  People who are overweight  (obese).  Athletes who exercise vigorously.  What are the signs or symptoms? Symptoms of this condition include:  A feeling that your heart is beating rapidly or irregularly.  A feeling of discomfort or pain in your chest.  Shortness of breath.  Sudden light-headedness or weakness.  Getting tired easily during exercise.  In some cases, there are no symptoms. How is this diagnosed? Your health care provider may be able to detect atrial fibrillation when taking your pulse. If detected, this condition may be diagnosed with:  An electrocardiogram (ECG).  A Holter monitor test that records your heartbeat patterns over a 24-hour period.  Transthoracic echocardiogram (TTE) to evaluate how blood flows through your heart.  Transesophageal echocardiogram (TEE) to view more detailed images of your heart.  A stress test.  Imaging tests, such as a CT scan or chest X-ray.  Blood tests.  How is this treated? The main goals of treatment are to prevent blood clots from forming and to keep your heart beating at a normal rate and rhythm. The type of treatment that you receive depends on many factors, such as your underlying medical conditions and how you feel when you are experiencing atrial fibrillation. This condition may be treated with:  Medicine to slow down the heart rate, bring the heart's rhythm back to normal, or prevent clots from forming.  Electrical cardioversion. This is a procedure that resets your heart's rhythm by delivering a controlled, low-energy shock to the heart through your skin.  Different types of ablation, such as catheter ablation, catheter ablation with pacemaker, or surgical ablation. These procedures destroy the heart tissues that send abnormal signals. When the pacemaker is used, it is placed under your skin to help your heart beat in   a regular rhythm.  Follow these instructions at home:  Take over-the counter and prescription medicines only as told by your  health care provider.  If your health care provider prescribed a blood-thinning medicine (anticoagulant), take it exactly as told. Taking too much blood-thinning medicine can cause bleeding. If you do not take enough blood-thinning medicine, you will not have the protection that you need against stroke and other problems.  Do not use tobacco products, including cigarettes, chewing tobacco, and e-cigarettes. If you need help quitting, ask your health care provider.  If you have obstructive sleep apnea, manage your condition as told by your health care provider.  Do not drink alcohol.  Do not drink beverages that contain caffeine, such as coffee, soda, and tea.  Maintain a healthy weight. Do not use diet pills unless your health care provider approves. Diet pills may make heart problems worse.  Follow diet instructions as told by your health care provider.  Exercise regularly as told by your health care provider.  Keep all follow-up visits as told by your health care provider. This is important. How is this prevented?  Avoid drinking beverages that contain caffeine or alcohol.  Avoid certain medicines, especially medicines that are used for breathing problems.  Avoid certain herbs and herbal medicines, such as those that contain ephedra or ginseng.  Do not use illegal drugs, such as cocaine and amphetamines.  Do not smoke.  Manage your high blood pressure. Contact a health care provider if:  You notice a change in the rate, rhythm, or strength of your heartbeat.  You are taking an anticoagulant and you notice increased bruising.  You tire more easily when you exercise or exert yourself. Get help right away if:  You have chest pain, abdominal pain, sweating, or weakness.  You feel nauseous.  You notice blood in your vomit, bowel movement, or urine.  You have shortness of breath.  You suddenly have swollen feet and ankles.  You feel dizzy.  You have sudden weakness or  numbness of the face, arm, or leg, especially on one side of the body.  You have trouble speaking, trouble understanding, or both (aphasia).  Your face or your eyelid droops on one side. These symptoms may represent a serious problem that is an emergency. Do not wait to see if the symptoms will go away. Get medical help right away. Call your local emergency services (911 in the [place] Do not drive yourself to the hospital. This information is not intended to replace advice given to you by your health care provider. Make sure you discuss any questions you have with your health care provider. Document Released: 02/08/2005 Document Revised: 06/18/2015 Document Reviewed: 06/05/2014 Elsevier Interactive Patient Education  2017 [employer]  

## 2016-01-28 NOTE — Progress Notes (Signed)
Pre visit review using our clinic review tool, if applicable. No additional management support is needed unless otherwise documented below in the visit note. 

## 2016-01-28 NOTE — Progress Notes (Signed)
Subjective:     Patient ID: Tammy [name], female   DOB: 1950/04/17, 65 y.o.   MRN: [id number]  HPI Patient seen for ER follow-up. She has long history of palpitations and has had some documented PACs in the past but she developed increasing palpitations on her birthday from 7 PM until 10 PM. She went in and was noted to be in A. fib/flutter with ventricular rate around 141. Apparently, she spontaneously converted in the waiting area. She had no chest pains. No dyspnea. No prior history of documented A. fib. She has had previous event monitoring which was unremarkable. She's already seen cardiology in follow-up. She is now taking Xarelto 20 mg once daily with ChadsVasc score of 2. No bleeding complications. She's not had any recurrent palpitations since then. She was prescribed Toprol 25 mg once daily take as needed for palpitations.  She has scaled back caffeine use. No regular alcohol use. No history of known valvular disorder. Echocardiogram pending. Recent TSH normal. No recent pneumonia. Troponins were negative  Past Medical History:  Diagnosis Date  . Hyperlipidemia    Past Surgical History:  Procedure Laterality Date  . DILATION AND CURETTAGE OF UTERUS N/A     reports that she quit smoking about 42 years ago. Her smoking use included Cigarettes. She has a 5.00 pack-year smoking history. She has never used smokeless tobacco. She reports that she drinks alcohol. Her drug history is not on file. family history includes Cancer in her father, mother, and sister; Heart disease in her father; Heart disease (age of onset: 46) in her mother. Allergies  Allergen Reactions  . Contrast Media [Iodinated Diagnostic Agents] Itching    Eye Swelling  . Penicillins   '   Review of Systems  Constitutional: Negative for fatigue and unexpected weight change.  Eyes: Negative for visual disturbance.  Respiratory: Negative for cough, chest tightness, shortness of breath and wheezing.   Cardiovascular:  Positive for palpitations. Negative for chest pain and leg swelling.  Neurological: Negative for dizziness, seizures, syncope, weakness, light-headedness and headaches.       Objective:   Physical Exam  Constitutional: She appears well-developed and well-nourished.  Eyes: Pupils are equal, round, and reactive to light.  Neck: Neck supple. No JVD present. No thyromegaly present.  Cardiovascular: Normal rate and regular rhythm.  Exam reveals no gallop.   Pulmonary/Chest: Effort normal and breath sounds normal. No respiratory distress. She has no wheezes. She has no rales.  Musculoskeletal: She exhibits no edema.  Neurological: She is alert.       Assessment:     Atrial fib/flutter recently in ER. ChadsVasc score of 2 now on anticoagulation with Xarelto. Currently sinus rhythm    Plan:     -Follow-up with cardiology for echocardiogram as scheduled -Continue Xarelto. -We tried to answer any questions she had regarding atrial fibrillation. -follow up immediately for any bleeding complications or concerns. -she knows to avoid regular use of NSAIDS.  Eulas Post MD [facility] at [facility]

## 2016-01-29 DIAGNOSIS — H5211 Myopia, right eye: Secondary | ICD-10-CM | POA: Diagnosis not present

## 2016-02-05 ENCOUNTER — Telehealth (HOSPITAL_COMMUNITY): Payer: Self-pay | Admitting: Physician Assistant

## 2016-02-05 NOTE — Telephone Encounter (Signed)
Called pt and lmsg giving her a new appt date and time of 12/28 at 11:30am due to the technician not being in the office to perform the test. I asked that if the time did not suit her to call back and we could reschedule.

## 2016-02-11 ENCOUNTER — Other Ambulatory Visit (HOSPITAL_COMMUNITY): Payer: PPO

## 2016-02-19 ENCOUNTER — Ambulatory Visit (HOSPITAL_COMMUNITY): Payer: PPO | Attending: Cardiovascular Disease

## 2016-02-19 ENCOUNTER — Other Ambulatory Visit: Payer: Self-pay

## 2016-02-19 DIAGNOSIS — I4891 Unspecified atrial fibrillation: Secondary | ICD-10-CM | POA: Insufficient documentation

## 2016-02-20 ENCOUNTER — Other Ambulatory Visit: Payer: Self-pay | Admitting: Family Medicine

## 2016-02-20 ENCOUNTER — Other Ambulatory Visit: Payer: Self-pay | Admitting: Cardiology

## 2016-02-20 NOTE — Telephone Encounter (Signed)
Okay to refill or this something that cardiology should continue to prescribe.

## 2016-02-20 NOTE — Telephone Encounter (Signed)
Pt need new Rx for rivaroxaban  [facility])  Pharm:  [place]  Pt state that she is out and do not have any to take tonight.

## 2016-02-24 MED ORDER — RIVAROXABAN 20 MG PO TABS: ORAL_TABLET | ORAL | 6 refills | Status: DC

## 2016-02-24 NOTE — Telephone Encounter (Signed)
Refill for 6 months. 

## 2016-02-24 NOTE — Telephone Encounter (Signed)
Refill sent into the pharmacy 

## 2016-02-27 ENCOUNTER — Ambulatory Visit (INDEPENDENT_AMBULATORY_CARE_PROVIDER_SITE_OTHER): Payer: PPO | Admitting: Family Medicine

## 2016-02-27 VITALS — BP 120/70 | HR 87 | Temp 98.1°F | Ht 65.5 in | Wt 112.9 lb

## 2016-02-27 DIAGNOSIS — R1031 Right lower quadrant pain: Secondary | ICD-10-CM | POA: Diagnosis not present

## 2016-02-27 LAB — CBC WITH DIFFERENTIAL/PLATELET
Basophils Absolute: 0.1 10*3/uL (ref 0.0–0.1)
Basophils Relative: 0.8 % (ref 0.0–3.0)
Eosinophils Absolute: 0 10*3/uL (ref 0.0–0.7)
Eosinophils Relative: 0.5 % (ref 0.0–5.0)
HCT: 41.5 % (ref 36.0–46.0)
Hemoglobin: 14 g/dL (ref 12.0–15.0)
Lymphocytes Relative: 25.9 % (ref 12.0–46.0)
Lymphs Abs: 1.7 10*3/uL (ref 0.7–4.0)
MCHC: 33.8 g/dL (ref 30.0–36.0)
MCV: 94.1 fl (ref 78.0–100.0)
Monocytes Absolute: 0.5 10*3/uL (ref 0.1–1.0)
Monocytes Relative: 7.7 % (ref 3.0–12.0)
Neutro Abs: 4.3 10*3/uL (ref 1.4–7.7)
Neutrophils Relative %: 65.1 % (ref 43.0–77.0)
Platelets: 322 10*3/uL (ref 150.0–400.0)
RBC: 4.41 Mil/uL (ref 3.87–5.11)
RDW: 12.9 % (ref 11.5–15.5)
WBC: 6.6 10*3/uL (ref 4.0–10.5)

## 2016-02-27 LAB — POCT URINALYSIS DIPSTICK
Bilirubin, UA: NEGATIVE
Glucose, UA: NEGATIVE
Ketones, UA: NEGATIVE
Leukocytes, UA: NEGATIVE
Nitrite, UA: NEGATIVE
Protein, UA: NEGATIVE
Spec Grav, UA: 1.01
Urobilinogen, UA: 0.2
pH, UA: 7

## 2016-02-27 NOTE — Patient Instructions (Signed)

## 2016-02-27 NOTE — Progress Notes (Signed)
Subjective:     Patient ID: Tammy [name], female   DOB: 02-13-1951, 66 y.o.   MRN: [id number]  HPI Patient seen with abdominal pain right lower quadrant area with some radiation toward the back area with onset Tuesday morning when she woke up. Pain was 7 out of 10 at it's worst. Somewhat intermittent. Pain is actually better today. No specific injury but she does recall Monday carrying lots of Christmas decorations up into the attic. She remembers straining some. She's not any appetite change. No stool changes. No dysuria. No gross hematuria. Denies any fevers or chills. No history of kidney stones. No appetite or weight changes. No dysuria. Pain is improved this morning. No pain with ambulation. Denies any lower extremity numbness or weakness.  Past Medical History:  Diagnosis Date  . Hyperlipidemia    Past Surgical History:  Procedure Laterality Date  . DILATION AND CURETTAGE OF UTERUS N/A     reports that she quit smoking about 42 years ago. Her smoking use included Cigarettes. She has a 5.00 pack-year smoking history. She has never used smokeless tobacco. She reports that she drinks alcohol. Her drug history is not on file. family history includes Cancer in her father, mother, and sister; Heart disease in her father; Heart disease (age of onset: 27) in her mother. Allergies  Allergen Reactions  . Contrast Media [Iodinated Diagnostic Agents] Itching    Eye Swelling  . Penicillins      Review of Systems  Constitutional: Negative for appetite change, chills, fever and unexpected weight change.  Respiratory: Negative for cough and shortness of breath.   Cardiovascular: Negative for chest pain.  Gastrointestinal: Positive for abdominal pain. Negative for blood in stool, diarrhea, nausea and vomiting.  Genitourinary: Negative for dysuria and hematuria.  Skin: Negative for rash.       Objective:   Physical Exam  Constitutional: She appears well-developed and well-nourished.   Cardiovascular: Normal rate and regular rhythm.   Pulmonary/Chest: Effort normal and breath sounds normal.  Abdominal: Soft. Bowel sounds are normal. She exhibits no distension and no mass. There is no rebound and no guarding.  Very minimal tenderness right lower quadrant to deep palpation. No guarding or rebound. No mass       Assessment:     Right lower quadrant abdominal pain. Her symptoms are actually improved today. Doubt appendicitis. She does not have any fever or other indicators of acute infection.    Plan:     -Check urine dipstick and CBC (she has chronic blood on urine dip with prior negative urologic evaluation). -Follow-up promptly for any fever, worsening pain, or other new symptoms  Eulas Post MD [facility]

## 2016-02-27 NOTE — Progress Notes (Signed)
Pre visit review using our clinic review tool, if applicable. No additional management support is needed unless otherwise documented below in the visit note. 

## 2016-03-31 ENCOUNTER — Encounter: Payer: Self-pay | Admitting: Cardiology

## 2016-03-31 ENCOUNTER — Ambulatory Visit (INDEPENDENT_AMBULATORY_CARE_PROVIDER_SITE_OTHER): Payer: PPO | Admitting: Cardiology

## 2016-03-31 VITALS — BP 126/72 | HR 86 | Ht 65.5 in | Wt 113.4 lb

## 2016-03-31 DIAGNOSIS — I480 Paroxysmal atrial fibrillation: Secondary | ICD-10-CM

## 2016-03-31 DIAGNOSIS — Z7901 Long term (current) use of anticoagulants: Secondary | ICD-10-CM

## 2016-03-31 DIAGNOSIS — E782 Mixed hyperlipidemia: Secondary | ICD-10-CM | POA: Diagnosis not present

## 2016-03-31 NOTE — Patient Instructions (Signed)
Medication Instructions:  Your physician recommends that you continue on your current medications as directed. Please refer to the Current Medication list given to you today.   Labwork: none  Testing/Procedures: none  Follow-Up: Your physician wants you to follow-up in: 6 months with Nell Range, PA You will receive a reminder letter in the mail two months in advance. If you don't receive a letter, please call our office to schedule the follow-up appointment.   Any Other Special Instructions Will Be Listed Below (If Applicable).     If you need a refill on your cardiac medications before your next appointment, please call your pharmacy.

## 2016-03-31 NOTE — Progress Notes (Signed)
Cardiology Office Note    Date:  03/31/2016   ID:  Oneyda, Dejoseph [birth year], MRN [id number]  PCP:  Eulas Post, MD  Cardiologist:   Candee Furbish, MD     History of Present Illness:  Tammy [name] is a 66 y.o. female with atrial fibrillation/flutter with rapid ventricular response 141 bpm who spontaneously converted in the waiting area previously. No chest pain, no shortness of breath. Previously documented PACs. Her episode lasted from 7 PM to 10 PM. She is taking Xarelto 20 mg once daily. Score is 2. Toprol 25 mg PRN. Troponins were normal in the ER.  She is doing quite well. Has had PACs for many years. Her father had PACs as well. Her father had atrial fibrillation but refused to take anticoagulation and he had a stroke. She had several questions answered. Overall she is doing well. No chest pain, no shortness of breath, no syncope, no bleeding.  Past Medical History:  Diagnosis Date  . Hyperlipidemia     Past Surgical History:  Procedure Laterality Date  . DILATION AND CURETTAGE OF UTERUS N/A     Current Medications: Outpatient Medications Prior to Visit  Medication Sig Dispense Refill  . calcium carbonate (OS-CAL) 600 MG TABS Take 600 mg by mouth 2 (two) times daily with a meal.      . Cholecalciferol (VITAMIN D3) 1000 UNITS CAPS Take by mouth daily.      Marland Kitchen LORazepam (ATIVAN) 0.5 MG tablet Take one tablet one hour prior to flight (Patient taking differently: every 8 (eight) hours as needed. Take one tablet one hour prior to flight) 4 tablet 0  . metoprolol tartrate (LOPRESSOR) 25 MG tablet Take 1 tablet (25 mg total) by mouth as needed. 30 tablet 0  . rivaroxaban (XARELTO) 20 MG TABS tablet take 1 tablet by mouth once daily WITH SUPPER 30 tablet 6   No facility-administered medications prior to visit.      Allergies:   Contrast media [iodinated diagnostic agents] and Penicillins   Social History   Social History  . Marital status: Married    Spouse name:  N/A  . Number of children: N/A  . Years of education: N/A   Social History Main Topics  . Smoking status: Former Smoker    Packs/day: 1.00    Years: 5.00    Types: Cigarettes    Quit date: 05/26/1973  . Smokeless tobacco: Never Used  . Alcohol use Yes     Comment: 4 x's a wk a glass of wine  . Drug use: Unknown  . Sexual activity: Not Asked   Other Topics Concern  . None   Social History Narrative  . None     Family History:  The patient's family history includes Cancer in her father, mother, and sister; Heart disease in her father; Heart disease (age of onset: 56) in her mother.   ROS:   Please see the history of present illness.    ROS All other systems reviewed and are negative.   PHYSICAL EXAM:   VS:  BP 126/72   Pulse 86   Ht 5' 5.5" (1.664 m)   Wt 113 lb 6.4 oz (51.4 kg)   LMP  (LMP Unknown)   BMI 18.58 kg/m    GEN: Well nourished, well developed, in no acute distress  HEENT: normal  Neck: no JVD, carotid bruits, or masses Cardiac: RRR; no murmurs, rubs, or gallops,no edema  Respiratory:  clear to auscultation bilaterally, normal work of  breathing GI: soft, nontender, nondistended, + BS MS: no deformity or atrophy  Skin: warm and dry, no rash Neuro:  Alert and Oriented x 3, Strength and sensation are intact Psych: euthymic mood, full affect  Wt Readings from Last 3 Encounters:  03/31/16 113 lb 6.4 oz (51.4 kg)  02/27/16 112 lb 14.4 oz (51.2 kg)  01/28/16 114 lb 11.2 oz (52 kg)      Studies/Labs Reviewed:   EKG:  EKG is Not ordered today  Recent Labs: 01/20/2016: ALT 17; BUN 12; Creatinine, Ser 0.89; Potassium 3.5; Sodium 143; TSH 3.898 02/27/2016: Hemoglobin 14.0; Platelets 322.0   Lipid Panel    Component Value Date/Time   CHOL 202 (H) 10/03/2015 0811   TRIG 84.0 10/03/2015 0811   HDL 58.20 10/03/2015 0811   CHOLHDL 3 10/03/2015 0811   VLDL 16.8 10/03/2015 0811   LDLCALC 127 (H) 10/03/2015 0811   LDLDIRECT 142.6 03/04/2010 1001     Additional studies/ records that were reviewed today include:  ECHO 02/19/16 - Left ventricle: The cavity size was normal. Systolic function was   normal. The estimated ejection fraction was in the range of 55%   to 60%. Wall motion was normal; there were no regional wall   motion abnormalities. Left ventricular diastolic function   parameters were normal. - Pericardium, extracardiac: A trivial pericardial effusion was   identified.    ASSESSMENT:    1. PAF (paroxysmal atrial fibrillation) ([place])   2. Mixed hyperlipidemia   3. Chronic anticoagulation      PLAN:  In order of problems listed above:  Atrial fibrillation/flutter, Paroxysmal  - 3 hour episode. Had one glass of wine. Echo reassuring. Decreasing caffeine use. Family history of atrial fibrillation. Father had a stroke not on anticoagulation. Mother had CABG.  - Agree with anticoagulation, Xarelto. Many questions answered.  - She knows to take metoprolol on an as-needed basis if her heart rate increases.  Hyperlipidemia  - LDL 127  - Not interested in statin therapy.  Chronic anti-coag   - Doing well, no bleeding.  Medication Adjustments/Labs and Tests Ordered: Current medicines are reviewed at length with the patient today.  Concerns regarding medicines are outlined above.  Medication changes, Labs and Tests ordered today are listed in the Patient Instructions below. Patient Instructions  Medication Instructions:  Your physician recommends that you continue on your current medications as directed. Please refer to the Current Medication list given to you today.   Labwork: none  Testing/Procedures: none  Follow-Up: Your physician wants you to follow-up in: 6 months with Nell Range, PA You will receive a reminder letter in the mail two months in advance. If you don't receive a letter, please call our office to schedule the follow-up appointment.   Any Other Special Instructions Will Be Listed Below  (If Applicable).     If you need a refill on your cardiac medications before your next appointment, please call your pharmacy.      Signed, Candee Furbish, MD  03/31/2016 2:12 PM    [facility] [place], [place], [place]  [address] Phone: [phone]; Fax: [phone]

## 2016-04-13 ENCOUNTER — Telehealth: Payer: Self-pay

## 2016-04-13 NOTE — Telephone Encounter (Signed)
Pt is returning sheena call to let her know she is better and does not need zofran for nausea

## 2016-04-13 NOTE — Telephone Encounter (Signed)
Noted  

## 2016-04-13 NOTE — Telephone Encounter (Signed)
[facility] Patient Name: Tammy [name] Gender: Female DOB: [birth year] Age: 66 Y 2 M 23 D Return Phone Number: [phone] (Primary), [phone] (Secondary) City/State/Zip: [place] [name] - Client Client Site [facility] - Night Physician Carolann Littler - MD Who Is Calling Patient / Member / Family / Caregiver Call Type Triage / Clinical Relationship To Patient Self Return Phone Number [phone] (Primary) Chief Complaint Vomiting Reason for Call Symptomatic / Request for [place] states she is vomiting. She takes a blood thinner and diagnosed with Atrial Fib at the end of last year so she is concerned because she may be vomiting the medication up.

## 2016-05-04 DIAGNOSIS — Z800 Family history of malignant neoplasm of digestive organs: Secondary | ICD-10-CM | POA: Diagnosis not present

## 2016-05-04 DIAGNOSIS — K5900 Constipation, unspecified: Secondary | ICD-10-CM | POA: Diagnosis not present

## 2016-06-17 DIAGNOSIS — Z800 Family history of malignant neoplasm of digestive organs: Secondary | ICD-10-CM | POA: Diagnosis not present

## 2016-06-17 DIAGNOSIS — K640 First degree hemorrhoids: Secondary | ICD-10-CM | POA: Diagnosis not present

## 2016-06-17 DIAGNOSIS — Z1211 Encounter for screening for malignant neoplasm of colon: Secondary | ICD-10-CM | POA: Diagnosis not present

## 2016-06-17 LAB — HM COLONOSCOPY

## 2016-06-22 ENCOUNTER — Encounter: Payer: Self-pay | Admitting: Family Medicine

## 2016-07-28 DIAGNOSIS — D225 Melanocytic nevi of trunk: Secondary | ICD-10-CM | POA: Diagnosis not present

## 2016-07-28 DIAGNOSIS — D2272 Melanocytic nevi of left lower limb, including hip: Secondary | ICD-10-CM | POA: Diagnosis not present

## 2016-07-28 DIAGNOSIS — Z86018 Personal history of other benign neoplasm: Secondary | ICD-10-CM | POA: Diagnosis not present

## 2016-09-21 ENCOUNTER — Ambulatory Visit: Payer: PPO | Admitting: Physician Assistant

## 2016-09-22 ENCOUNTER — Ambulatory Visit (INDEPENDENT_AMBULATORY_CARE_PROVIDER_SITE_OTHER): Payer: PPO | Admitting: Physician Assistant

## 2016-09-22 ENCOUNTER — Encounter: Payer: Self-pay | Admitting: Physician Assistant

## 2016-09-22 VITALS — BP 126/70 | HR 70 | Ht 65.5 in | Wt 107.0 lb

## 2016-09-22 DIAGNOSIS — E782 Mixed hyperlipidemia: Secondary | ICD-10-CM | POA: Diagnosis not present

## 2016-09-22 DIAGNOSIS — I480 Paroxysmal atrial fibrillation: Secondary | ICD-10-CM | POA: Diagnosis not present

## 2016-09-22 MED ORDER — RIVAROXABAN 20 MG PO TABS: 20.0000 mg | ORAL_TABLET | Freq: Every day | ORAL | 11 refills | Status: DC

## 2016-09-22 NOTE — Patient Instructions (Signed)
Your physician recommends that you continue on your current medications as directed. Please refer to the Current Medication list given to you today.  Your physician wants you to follow-up in: 6 months with Dr. Skains. You will receive a reminder letter in the mail two months in advance. If you don't receive a letter, please call our office to schedule the follow-up appointment.  

## 2016-09-22 NOTE — Progress Notes (Signed)
Cardiology Office Note    Date:  09/22/2016   ID:  Tammy [name], Tammy [name] 11-Feb-1951, MRN [id number]  PCP:  Eulas Post, MD  Cardiologist:  Dr. Marlou Porch   Chief Complaint: 6 months follow up for afib/aflutter  History of Present Illness:   Tammy [name] is a 66 y.o. female with hx of paroxysmal atrial fibrillation/aflutter and HLD presents for follow up.   He was seen in ER 01/20/16 for palpitation. Symptoms started while watching TV. Upon arrival in ER, triage EKG read as atrial flutter 2:1 at rate of 141 bpm. He converted to sinus rhythm when brought back in exam room. Started on Xarelto for CHADSVASC score of 2.   Patient has hx of PAC for many years. Her father had PACs as well. Father has afib and MV prolapse --> stoke (not on anticoagulation at that time). Mother has CABG in 33s.  Here today for follow up. The patient denies nausea, vomiting, fever, chest pain, palpitations, shortness of breath, orthopnea, PND, dizziness, syncope, cough, congestion, abdominal pain, hematochezia, melena, lower extremity edema.  Past Medical History:  Diagnosis Date  . Hyperlipidemia     Past Surgical History:  Procedure Laterality Date  . DILATION AND CURETTAGE OF UTERUS N/A     Current Medications: Prior to Admission medications   Medication Sig Start Date End Date Taking? Authorizing Provider  calcium carbonate (OS-CAL) 600 MG TABS Take 600 mg by mouth 2 (two) times daily with a meal.      [provider]  Cholecalciferol (VITAMIN D3) 1000 UNITS CAPS Take by mouth daily.      [provider]  LORazepam (ATIVAN) 0.5 MG tablet Take one tablet one hour prior to flight Patient taking differently: every 8 (eight) hours as needed. Take one tablet one hour prior to flight 10/13/15   Eulas Post, MD  metoprolol tartrate (LOPRESSOR) 25 MG tablet Take 1 tablet (25 mg total) by mouth as needed. 01/21/16   Dowless, Aldona Bar Tripp, PA-C  rivaroxaban (XARELTO) 20 MG TABS  tablet take 1 tablet by mouth once daily WITH SUPPER 02/24/16   Burchette, Alinda Sierras, MD    Allergies:   Penicillins; Codeine; and Contrast media [iodinated diagnostic agents]   Social History   Social History  . Marital status: Married    Spouse name: N/A  . Number of children: N/A  . Years of education: N/A   Social History Main Topics  . Smoking status: Former Smoker    Packs/day: 1.00    Years: 5.00    Types: Cigarettes    Quit date: 05/26/1973  . Smokeless tobacco: Never Used  . Alcohol use Yes     Comment: 4 x's a wk a glass of wine  . Drug use: Unknown  . Sexual activity: Not Asked   Other Topics Concern  . None   Social History Narrative  . None     Family History:  The patient's family history includes Cancer in her father, mother, and sister; Heart disease in her father; Heart disease (age of onset: 26) in her mother.   ROS:   Please see the history of present illness.    ROS All other systems reviewed and are negative.   PHYSICAL EXAM:   VS:  BP 126/70   Pulse 70   Ht 5' 5.5" (1.664 m)   Wt 107 lb (48.5 kg)   LMP  (LMP Unknown)   BMI 17.53 kg/m    GEN: Well nourished, well developed,  in no acute distress  HEENT: normal  Neck: no JVD, carotid bruits, or masses Cardiac: RRR; no murmurs, rubs, or gallops,no edema  Respiratory:  clear to auscultation bilaterally, normal work of breathing GI: soft, nontender, nondistended, + BS MS: no deformity or atrophy  Skin: warm and dry, no rash Neuro:  Alert and Oriented x 3, Strength and sensation are intact Psych: euthymic mood, full affect  Wt Readings from Last 3 Encounters:  09/22/16 107 lb (48.5 kg)  03/31/16 113 lb 6.4 oz (51.4 kg)  02/27/16 112 lb 14.4 oz (51.2 kg)      Studies/Labs Reviewed:   EKG:  EKG is ordered today.  The ekg ordered today demonstrates sinus rhythm at rate of 70 bpm. PACs  Recent Labs: 01/20/2016: ALT 17; BUN 12; Creatinine, Ser 0.89; Potassium 3.5; Sodium 143; TSH  3.898 02/27/2016: Hemoglobin 14.0; Platelets 322.0   Lipid Panel    Component Value Date/Time   CHOL 202 (H) 10/03/2015 0811   TRIG 84.0 10/03/2015 0811   HDL 58.20 10/03/2015 0811   CHOLHDL 3 10/03/2015 0811   VLDL 16.8 10/03/2015 0811   LDLCALC 127 (H) 10/03/2015 0811   LDLDIRECT 142.6 03/04/2010 1001    Additional studies/ records that were reviewed today include:   Echo 02/19/16 Study Conclusions  - Left ventricle: The cavity size was normal. Systolic function was   normal. The estimated ejection fraction was in the range of 55%   to 60%. Wall motion was normal; there were no regional wall   motion abnormalities. Left ventricular diastolic function   parameters were normal. - Pericardium, extracardiac: A trivial pericardial effusion was   identified.     ASSESSMENT & PLAN:    1 Paroxysmal atrial fibrillation/aflutter  - Maintaining sinus rhythm. She had lots of questions regarding Xarelto. All answered. Continue PRN BB.   2. HLD - Per PCP    Medication Adjustments/Labs and Tests Ordered: Current medicines are reviewed at length with the patient today.  Concerns regarding medicines are outlined above.  Medication changes, Labs and Tests ordered today are listed in the Patient Instructions below. There are no Patient Instructions on file for this visit.   Jarrett Soho, [place]  09/22/2016 2:35 PM    [facility] [place], [place], [place]  [address] Phone: [phone]; Fax: [phone]

## 2016-09-23 ENCOUNTER — Other Ambulatory Visit: Payer: Self-pay | Admitting: Obstetrics and Gynecology

## 2016-09-23 DIAGNOSIS — Z1231 Encounter for screening mammogram for malignant neoplasm of breast: Secondary | ICD-10-CM

## 2016-09-30 ENCOUNTER — Ambulatory Visit
Admission: RE | Admit: 2016-09-30 | Discharge: 2016-09-30 | Disposition: A | Discharge status: Home / Self Care | Payer: PPO | Source: Ambulatory Visit | Attending: Obstetrics and Gynecology | Admitting: Obstetrics and Gynecology

## 2016-09-30 DIAGNOSIS — Z1231 Encounter for screening mammogram for malignant neoplasm of breast: Secondary | ICD-10-CM | POA: Diagnosis not present

## 2016-11-11 ENCOUNTER — Encounter: Payer: Self-pay | Admitting: Family Medicine

## 2016-12-03 ENCOUNTER — Ambulatory Visit: Payer: PPO | Admitting: Family Medicine

## 2016-12-06 ENCOUNTER — Ambulatory Visit (INDEPENDENT_AMBULATORY_CARE_PROVIDER_SITE_OTHER): Payer: PPO | Admitting: Family Medicine

## 2016-12-06 ENCOUNTER — Encounter: Payer: Self-pay | Admitting: Family Medicine

## 2016-12-06 VITALS — BP 110/78 | HR 73 | Temp 97.6°F | Ht 65.5 in | Wt 111.8 lb

## 2016-12-06 DIAGNOSIS — Z23 Encounter for immunization: Secondary | ICD-10-CM

## 2016-12-06 DIAGNOSIS — Z0000 Encounter for general adult medical examination without abnormal findings: Secondary | ICD-10-CM | POA: Diagnosis not present

## 2016-12-06 NOTE — Progress Notes (Signed)
Subjective:     Patient ID: Delfino Lovett, female   DOB: 10-15-1950, 66 y.o.   MRN: [id number]  HPI Patient seen for physical exam. She continues to see gynecologist yearly and they are coordinating her mammograms and bone density scans. She has decided against any medical intervention for osteoporosis. She takes calcium and vitamin D but inconsistently.  Exercises regularly. Colonoscopy up-to-date. Tetanus up-to-date. No history of hepatitis C screening. Low risk. She also needs flu vaccine and Pneumovax and prefers not to get more the one vaccine today. No history of shingles vaccine.  History of paroxysmal atrial fibrillation on Xarelto. She is not aware of any recent flares.  Past Medical History:  Diagnosis Date  . Hyperlipidemia    Past Surgical History:  Procedure Laterality Date  . DILATION AND CURETTAGE OF UTERUS N/A     reports that she quit smoking about 43 years ago. Her smoking use included Cigarettes. She has a 5.00 pack-year smoking history. She has never used smokeless tobacco. She reports that she drinks alcohol. Her drug history is not on file. family history includes Cancer in her father, mother, and sister; Heart disease in her father; Heart disease (age of onset: 5) in her mother. Allergies  Allergen Reactions  . Penicillins Rash  . Codeine Other (See Comments)    Pt. States "crazy dreams"  . Contrast Media [Iodinated Diagnostic Agents] Itching    Eye Swelling     Review of Systems  Constitutional: Negative for activity change, appetite change, fatigue, fever and unexpected weight change.  HENT: Negative for ear pain, hearing loss, sore throat and trouble swallowing.   Eyes: Negative for visual disturbance.  Respiratory: Negative for cough and shortness of breath.   Cardiovascular: Negative for chest pain and palpitations.  Gastrointestinal: Negative for abdominal pain, blood in stool, constipation and diarrhea.  Endocrine: Negative for polydipsia and  polyuria.  Genitourinary: Negative for dysuria and hematuria.  Musculoskeletal: Negative for arthralgias, back pain and myalgias.  Skin: Negative for rash.  Neurological: Negative for dizziness, syncope and headaches.  Hematological: Negative for adenopathy.  Psychiatric/Behavioral: Negative for confusion and dysphoric mood.       Objective:   Physical Exam  Constitutional: She is oriented to person, place, and time. She appears well-developed and well-nourished.  HENT:  Head: Normocephalic and atraumatic.  Eyes: Pupils are equal, round, and reactive to light. EOM are normal.  Neck: Normal range of motion. Neck supple. No thyromegaly present.  Cardiovascular: Normal rate, regular rhythm and normal heart sounds.   No murmur heard. Pulmonary/Chest: Breath sounds normal. No respiratory distress. She has no wheezes. She has no rales.  Abdominal: Soft. Bowel sounds are normal. She exhibits no distension and no mass. There is no tenderness. There is no rebound and no guarding.  Musculoskeletal: Normal range of motion. She exhibits no edema.  Lymphadenopathy:    She has no cervical adenopathy.  Neurological: She is alert and oriented to person, place, and time. She displays normal reflexes. No cranial nerve deficit.  Skin: No rash noted.  Psychiatric: She has a normal mood and affect. Her behavior is normal. Judgment and thought content normal.       Assessment:     Physical exam. Several health maintenance issues addressed as below    Plan:     -flu vaccine given -Patient needs Pneumovax and she will return for that as she prefers not to get more than one vaccine today -Also recommend she consider new shingles vaccine and she  will look into this for coverage -She plans to continue with regular GYN follow-up -Check hepatitis C antibody -Patient is a former smoker but does not meet criteria for low-dose lung cancer CT screening -Continue regular weightbearing exercise and daily  calcium and vitamin D -We elected to go to every other year with things like lipids as he has been fairly well-controlled past  Eulas Post MD [facility]

## 2016-12-06 NOTE — Patient Instructions (Signed)
Return for pneumovax in a few weeks Consider new shingles vaccine (Shingrix) at some point this year.

## 2016-12-07 ENCOUNTER — Encounter: Payer: Self-pay | Admitting: Family Medicine

## 2016-12-07 LAB — HEPATITIS C ANTIBODY
Hepatitis C Ab: NONREACTIVE
SIGNAL TO CUT-OFF: 0.01 (ref <1.00)

## 2017-03-14 ENCOUNTER — Ambulatory Visit: Payer: Self-pay | Admitting: *Deleted

## 2017-03-14 ENCOUNTER — Ambulatory Visit (INDEPENDENT_AMBULATORY_CARE_PROVIDER_SITE_OTHER): Payer: PPO | Admitting: *Deleted

## 2017-03-14 DIAGNOSIS — Z23 Encounter for immunization: Secondary | ICD-10-CM | POA: Diagnosis not present

## 2017-03-14 DIAGNOSIS — M25512 Pain in left shoulder: Secondary | ICD-10-CM | POA: Diagnosis not present

## 2017-03-14 NOTE — Progress Notes (Signed)
Per orders of Dr. Elease Hashimoto, injection of Pneumovax 23 given by Dorrene German. Patient tolerated injection well.

## 2017-03-14 NOTE — Telephone Encounter (Signed)
Pt  Reports  Pain  In  Affected  Arm  From a  pnuemonia  Vaccine  Today   Received  The  Vaccine   About  4  Hours  Ago  No  Redness  Or  Swelling    Just  Has  Pain  -  No    Facial  Swelling   Or  Swelling  Of  Lips    No   Shortness  Of  Breath      Pt  Reports  Pain is  Worse  When  She  Moves  Her arm  Or  Touches  It .    She  Has  Been taking   Tylenol     She  Takes  Blood  thinneres   So  She  Was  Advised  No   Anti inflammatories .  She  Was  Advised  Apply  Ice  And  To  Follow  Guidelines  Of  Plan of care  Given to patient    Reason for Disposition . Injection site reaction to any vaccine  Answer Assessment - Initial Assessment Questions 1. SYMPTOMS: "What is the main symptom?" (e.g., redness, swelling, pain)        Pain      No  Swelling  No  Redness     2. ONSET: "When was the vaccine (shot) given?" "How much later did the __________ begin?" (e.g., hours, days ago) 1000  Am   Symptoms   Began    4  Hours   ago      3. SEVERITY: "How bad is it?"     8    When moves   It  5  When     Still     4. FEVER: "Is there a fever?" If so, ask: "What is it, how was it measured, and when did it start?"     No   Thermometer   Does  Not feel  Like  Has  Fever     5. IMMUNIZATIONS GIVEN: "What shots have you recently received?"     pnuemonia    Vaccine   Number  1    6. PAST REACTIONS: "Have you reacted to immunizations before?" If so, ask: "What happened?"     no 7. OTHER SYMPTOMS: "Do you have any other symptoms?"       No  Protocols used: IMMUNIZATION REACTIONS-A-AH

## 2017-03-21 ENCOUNTER — Ambulatory Visit: Payer: PPO | Admitting: Cardiology

## 2017-03-21 ENCOUNTER — Encounter: Payer: Self-pay | Admitting: Cardiology

## 2017-03-21 VITALS — BP 120/62 | HR 99 | Ht 65.0 in | Wt 115.2 lb

## 2017-03-21 DIAGNOSIS — E7800 Pure hypercholesterolemia, unspecified: Secondary | ICD-10-CM

## 2017-03-21 DIAGNOSIS — Z7901 Long term (current) use of anticoagulants: Secondary | ICD-10-CM

## 2017-03-21 DIAGNOSIS — R319 Hematuria, unspecified: Secondary | ICD-10-CM | POA: Diagnosis not present

## 2017-03-21 DIAGNOSIS — I480 Paroxysmal atrial fibrillation: Secondary | ICD-10-CM

## 2017-03-21 DIAGNOSIS — Z681 Body mass index (BMI) 19 or less, adult: Secondary | ICD-10-CM | POA: Diagnosis not present

## 2017-03-21 DIAGNOSIS — Z124 Encounter for screening for malignant neoplasm of cervix: Secondary | ICD-10-CM | POA: Diagnosis not present

## 2017-03-21 MED ORDER — METOPROLOL TARTRATE 25 MG PO TABS: 25.0000 mg | ORAL_TABLET | ORAL | 11 refills | Status: DC | PRN

## 2017-03-21 NOTE — Progress Notes (Signed)
Cardiology Office Note    Date:  03/21/2017   ID:  Tammy [name], Tammy [name] Dec 19, 1950, MRN [id number]  PCP:  Eulas Post, MD  Cardiologist:   Candee Furbish, MD     History of Present Illness:  Tammy [name] is a 67 y.o. female with atrial fibrillation/flutter with rapid ventricular response 141 bpm who spontaneously converted in the waiting area previously. No chest pain, no shortness of breath. Previously documented PACs. Her episode lasted from 7 PM to 10 PM. She is taking Xarelto 20 mg once daily. Score is 2. Toprol 25 mg PRN. Troponins were normal in the ER. EF normal.   Has had PACs for many years. Her father had PACs as well. Her father had atrial fibrillation but refused to take anticoagulation and he had a stroke. Mother had CABG in her 72's. She had several questions answered.   03/21/17-overall doing quite well, no new issues, no chest pain fevers chills nausea vomiting bleeding.  Takes Xarelto. Has not needed metoprolol PRN. Discussed Xarelto reversal agent.   Past Medical History:  Diagnosis Date  . Hyperlipidemia     Past Surgical History:  Procedure Laterality Date  . DILATION AND CURETTAGE OF UTERUS N/A     Current Medications: Outpatient Medications Prior to Visit  Medication Sig Dispense Refill  . calcium carbonate (OS-CAL) 600 MG TABS Take 600 mg by mouth 2 (two) times daily as needed.     . Cholecalciferol (VITAMIN D3) 1000 UNITS CAPS Take by mouth daily as needed.     . rivaroxaban (XARELTO) 20 MG TABS tablet Take 1 tablet (20 mg total) by mouth daily with supper. 30 tablet 11  . metoprolol tartrate (LOPRESSOR) 25 MG tablet Take 1 tablet (25 mg total) by mouth as needed. 30 tablet 0   No facility-administered medications prior to visit.      Allergies:   Penicillins; Codeine; and Contrast media [iodinated diagnostic agents]   Social History   Socioeconomic History  . Marital status: Married    Spouse name: None  . Number of children: None  . Years  of education: None  . Highest education level: None  Social Needs  . Financial resource strain: None  . Food insecurity - worry: None  . Food insecurity - inability: None  . Transportation needs - medical: None  . Transportation needs - non-medical: None  Occupational History  . None  Tobacco Use  . Smoking status: Former Smoker    Packs/day: 1.00    Years: 5.00    Pack years: 5.00    Types: Cigarettes    Last attempt to quit: 05/26/1973    Years since quitting: 43.8  . Smokeless tobacco: Never Used  Substance and Sexual Activity  . Alcohol use: Yes    Comment: 4 x's a wk a glass of wine  . Drug use: None  . Sexual activity: None  Other Topics Concern  . None  Social History Narrative  . None     Family History:  The patient's family history includes Cancer in her father, mother, and sister; Heart disease in her father; Heart disease (age of onset: 24) in her mother.   ROS:   Please see the history of present illness.    Review of Systems  All other systems reviewed and are negative.     PHYSICAL EXAM:   VS:  BP 120/62   Pulse 99   Ht 5\' 5"  (1.651 m)   Wt 115 lb 3.2 oz (52.3  kg)   LMP  (LMP Unknown)   SpO2 98%   BMI 19.17 kg/m    GEN: Thin, in no acute distress  HEENT: normal  Neck: no JVD, carotid bruits, or masses Cardiac: RRR; no murmurs, rubs, or gallops,no edema  Respiratory:  clear to auscultation bilaterally, normal work of breathing GI: soft, nontender, nondistended, + BS MS: no deformity or atrophy  Skin: warm and dry, no rash Neuro:  Alert and Oriented x 3, Strength and sensation are intact Psych: euthymic mood, full affect   Wt Readings from Last 3 Encounters:  03/21/17 115 lb 3.2 oz (52.3 kg)  12/06/16 111 lb 12.8 oz (50.7 kg)  09/22/16 107 lb (48.5 kg)      Studies/Labs Reviewed:   EKG:  EKG is Not ordered today  Recent Labs: No results found for requested labs within last 8760 hours.   Lipid Panel    Component Value Date/Time    CHOL 202 (H) 10/03/2015 0811   TRIG 84.0 10/03/2015 0811   HDL 58.20 10/03/2015 0811   CHOLHDL 3 10/03/2015 0811   VLDL 16.8 10/03/2015 0811   LDLCALC 127 (H) 10/03/2015 0811   LDLDIRECT 142.6 03/04/2010 1001    Additional studies/ records that were reviewed today include:  ECHO 02/19/16 - Left ventricle: The cavity size was normal. Systolic function was   normal. The estimated ejection fraction was in the range of 55%   to 60%. Wall motion was normal; there were no regional wall   motion abnormalities. Left ventricular diastolic function   parameters were normal. - Pericardium, extracardiac: A trivial pericardial effusion was   identified.    ASSESSMENT:    1. Paroxysmal atrial fibrillation (HCC)   2. Pure hypercholesterolemia   3. Chronic anticoagulation      PLAN:  In order of problems listed above:  Atrial fibrillation/flutter, Paroxysmal  - 3 hour prior episode. Had one glass of wine. Echo reassuring. Decreasing caffeine use. Family history of atrial fibrillation. Father had a stroke not on anticoagulation. Mother had CABG in her 35's. (SCORE at least 2. AGE, F) Has PRN metop.   - Agree with anticoagulation, Xarelto. She brought up reversal agent, FDA cleared.   - She knows to take metoprolol on an as-needed basis if her heart rate increases. Will renew Rx.   Hyperlipidemia  - LDL 127  - Not interested in statin therapy. Continue with diet.   Chronic anti-coagulation   - Doing well, no bleeding.  Medication Adjustments/Labs and Tests Ordered: Current medicines are reviewed at length with the patient today.  Concerns regarding medicines are outlined above.  Medication changes, Labs and Tests ordered today are listed in the Patient Instructions below. Patient Instructions  Medication Instructions:  The current medical regimen is effective;  continue present plan and medications.  Follow-Up: Follow up in 1 year with Dr. Marlou Porch.  You will receive a letter in the  mail 2 months before you are due.  Please call us when you receive this letter to schedule your follow up appointment.  If you need a refill on your cardiac medications before your next appointment, please call your pharmacy.  Thank you for choosing [facility]!!        Signed, Candee Furbish, MD  03/21/2017 1:30 PM    [facility] [place] [address], [place], [place]  [address] Phone: [phone]; Fax: [phone]

## 2017-03-21 NOTE — Patient Instructions (Signed)

## 2017-05-04 ENCOUNTER — Encounter: Payer: Self-pay | Admitting: Cardiology

## 2017-05-04 DIAGNOSIS — H5310 Unspecified subjective visual disturbances: Secondary | ICD-10-CM | POA: Diagnosis not present

## 2017-05-12 ENCOUNTER — Other Ambulatory Visit: Payer: Self-pay | Admitting: *Deleted

## 2017-05-12 DIAGNOSIS — H539 Unspecified visual disturbance: Secondary | ICD-10-CM

## 2017-05-26 ENCOUNTER — Ambulatory Visit (HOSPITAL_COMMUNITY)
Admission: RE | Admit: 2017-05-26 | Discharge: 2017-05-26 | Disposition: A | Discharge status: Home / Self Care | Payer: PPO | Source: Ambulatory Visit | Attending: Cardiovascular Disease | Admitting: Cardiovascular Disease

## 2017-05-26 DIAGNOSIS — H539 Unspecified visual disturbance: Secondary | ICD-10-CM | POA: Insufficient documentation

## 2017-05-26 DIAGNOSIS — Z87891 Personal history of nicotine dependence: Secondary | ICD-10-CM | POA: Insufficient documentation

## 2017-05-26 DIAGNOSIS — E785 Hyperlipidemia, unspecified: Secondary | ICD-10-CM | POA: Diagnosis not present

## 2017-09-12 ENCOUNTER — Other Ambulatory Visit: Payer: Self-pay | Admitting: Physician Assistant

## 2017-09-12 DIAGNOSIS — I480 Paroxysmal atrial fibrillation: Secondary | ICD-10-CM

## 2017-09-14 ENCOUNTER — Other Ambulatory Visit: Payer: PPO

## 2017-09-14 DIAGNOSIS — I480 Paroxysmal atrial fibrillation: Secondary | ICD-10-CM | POA: Diagnosis not present

## 2017-09-14 LAB — BASIC METABOLIC PANEL
BUN / CREAT RATIO: 18 (ref 12–28)
BUN: 13 mg/dL (ref 8–27)
CALCIUM: 9.3 mg/dL (ref 8.7–10.3)
CHLORIDE: 101 mmol/L (ref 96–106)
CO2: 24 mmol/L (ref 20–29)
CREATININE: 0.73 mg/dL (ref 0.57–1.00)
GFR calc non Af Amer: 85 mL/min/{1.73_m2} (ref >59)
GFR, EST AFRICAN AMERICAN: 99 mL/min/{1.73_m2} (ref >59)
GLUCOSE: 85 mg/dL (ref 65–99)
Potassium: 4.5 mmol/L (ref 3.5–5.2)
Sodium: 140 mmol/L (ref 134–144)

## 2017-09-14 LAB — CBC
HEMOGLOBIN: 14.3 g/dL (ref 11.1–15.9)
Hematocrit: 41.2 % (ref 34.0–46.6)
MCH: 31.7 pg (ref 26.6–33.0)
MCHC: 34.7 g/dL (ref 31.5–35.7)
MCV: 91 fL (ref 79–97)
PLATELETS: 285 10*3/uL (ref 150–450)
RBC: 4.51 x10E6/uL (ref 3.77–5.28)
RDW: 13.1 % (ref 12.3–15.4)
WBC: 6 10*3/uL (ref 3.4–10.8)

## 2017-11-15 ENCOUNTER — Other Ambulatory Visit: Payer: Self-pay | Admitting: Obstetrics and Gynecology

## 2017-11-15 DIAGNOSIS — Z1231 Encounter for screening mammogram for malignant neoplasm of breast: Secondary | ICD-10-CM

## 2017-11-23 ENCOUNTER — Ambulatory Visit
Admission: RE | Admit: 2017-11-23 | Discharge: 2017-11-23 | Disposition: A | Discharge status: Home / Self Care | Payer: PPO | Source: Ambulatory Visit

## 2017-11-23 DIAGNOSIS — Z1231 Encounter for screening mammogram for malignant neoplasm of breast: Secondary | ICD-10-CM

## 2018-01-09 ENCOUNTER — Ambulatory Visit (INDEPENDENT_AMBULATORY_CARE_PROVIDER_SITE_OTHER): Payer: PPO | Admitting: Family Medicine

## 2018-01-09 ENCOUNTER — Encounter: Payer: Self-pay | Admitting: Family Medicine

## 2018-01-09 ENCOUNTER — Other Ambulatory Visit: Payer: Self-pay

## 2018-01-09 VITALS — BP 120/80 | HR 94 | Temp 98.0°F | Ht 65.0 in | Wt 116.0 lb

## 2018-01-09 DIAGNOSIS — Z23 Encounter for immunization: Secondary | ICD-10-CM | POA: Diagnosis not present

## 2018-01-09 DIAGNOSIS — Z0000 Encounter for general adult medical examination without abnormal findings: Secondary | ICD-10-CM | POA: Diagnosis not present

## 2018-01-09 NOTE — Addendum Note (Signed)
Addended by: Anibal Henderson on: 01/09/2018 02:15 PM   Modules accepted: Orders

## 2018-01-09 NOTE — Progress Notes (Signed)
Subjective:     Patient ID: Tammy [name], female   DOB: [birth year], 67 y.o.   MRN: [id number]  HPI Patient seen for physical exam.  Generally very healthy.  Has history of paroxysmal atrial fibrillation.  She is not aware of any episodes recently.  Remains on Xarelto.  CHA2DS2VASc score of 2.  She has metoprolol to take if she has any tachyarrhythmias.  She is still followed by cardiology.  She still sees gynecologist regularly.  She plans to discuss follow-up DEXA scan with them.  Mammogram up-to-date.  No history of shingles vaccine.  Other immunizations up-to-date.  Past Medical History:  Diagnosis Date  . Hyperlipidemia    Past Surgical History:  Procedure Laterality Date  . DILATION AND CURETTAGE OF UTERUS N/A     reports that she quit smoking about 44 years ago. Her smoking use included cigarettes. She has a 5.00 pack-year smoking history. She has never used smokeless tobacco. She reports that she drinks alcohol. Her drug history is not on file. family history includes Cancer in her father, mother, and sister; Heart disease in her father; Heart disease (age of onset: 38) in her mother. Allergies  Allergen Reactions  . Penicillins Rash  . Codeine Other (See Comments)    Pt. States "crazy dreams"  . Contrast Media [Iodinated Diagnostic Agents] Itching    Eye Swelling     Review of Systems  Constitutional: Negative for activity change, appetite change, fatigue, fever and unexpected weight change.  HENT: Negative for ear pain, hearing loss, sore throat and trouble swallowing.   Eyes: Negative for visual disturbance.  Respiratory: Negative for cough and shortness of breath.   Cardiovascular: Negative for chest pain and palpitations.  Gastrointestinal: Negative for abdominal pain, blood in stool, constipation and diarrhea.  Genitourinary: Negative for dysuria and hematuria.  Musculoskeletal: Negative for arthralgias, back pain and myalgias.  Skin: Negative for rash.   Neurological: Negative for dizziness, syncope and headaches.  Hematological: Negative for adenopathy.  Psychiatric/Behavioral: Negative for confusion and dysphoric mood.       Objective:   Physical Exam  Constitutional: She is oriented to person, place, and time. She appears well-developed and well-nourished.  HENT:  Head: Normocephalic and atraumatic.  Eyes: Pupils are equal, round, and reactive to light. EOM are normal.  Neck: Normal range of motion. Neck supple. No thyromegaly present.  Cardiovascular: Normal rate, regular rhythm and normal heart sounds.  No murmur heard. Pulmonary/Chest: Breath sounds normal. No respiratory distress. She has no wheezes. She has no rales.  Abdominal: Soft. Bowel sounds are normal. She exhibits no distension and no mass. There is no tenderness. There is no rebound and no guarding.  Musculoskeletal: Normal range of motion. She exhibits no edema.  Lymphadenopathy:    She has no cervical adenopathy.  Neurological: She is alert and oriented to person, place, and time. She displays normal reflexes. No cranial nerve deficit.  Skin: No rash noted.  Psychiatric: She has a normal mood and affect. Her behavior is normal. Judgment and thought content normal.       Assessment:     Physical exam.  The following health maintenance issues were addressed    Plan:     -Flu vaccine given -Discussed Shingrix vaccine and she will check on insurance coverage -She will return this Wednesday for fasting labs -Continue with regular weightbearing exercise -She will continue with GYN follow-up regarding her mammograms -Would consider repeat DEXA scan this year.  She will discuss with GYN.  Eulas Post MD [facility]

## 2018-01-09 NOTE — Patient Instructions (Signed)
Consider shingles vaccine (Shingrix) and may check with pharmacy if interested.

## 2018-01-11 ENCOUNTER — Encounter: Payer: Self-pay | Admitting: Family Medicine

## 2018-01-11 ENCOUNTER — Other Ambulatory Visit (INDEPENDENT_AMBULATORY_CARE_PROVIDER_SITE_OTHER): Payer: PPO

## 2018-01-11 DIAGNOSIS — Z0000 Encounter for general adult medical examination without abnormal findings: Secondary | ICD-10-CM

## 2018-01-11 LAB — CBC WITH DIFFERENTIAL/PLATELET
BASOS PCT: 0.7 % (ref 0.0–3.0)
Basophils Absolute: 0 10*3/uL (ref 0.0–0.1)
Eosinophils Absolute: 0.1 10*3/uL (ref 0.0–0.7)
Eosinophils Relative: 2.3 % (ref 0.0–5.0)
HCT: 42.5 % (ref 36.0–46.0)
HEMOGLOBIN: 14.5 g/dL (ref 12.0–15.0)
LYMPHS ABS: 1.3 10*3/uL (ref 0.7–4.0)
Lymphocytes Relative: 29.4 % (ref 12.0–46.0)
MCHC: 34 g/dL (ref 30.0–36.0)
MCV: 95.6 fl (ref 78.0–100.0)
MONO ABS: 0.4 10*3/uL (ref 0.1–1.0)
Monocytes Relative: 8.8 % (ref 3.0–12.0)
NEUTROS ABS: 2.6 10*3/uL (ref 1.4–7.7)
NEUTROS PCT: 58.8 % (ref 43.0–77.0)
PLATELETS: 276 10*3/uL (ref 150.0–400.0)
RBC: 4.44 Mil/uL (ref 3.87–5.11)
RDW: 12.6 % (ref 11.5–15.5)
WBC: 4.5 10*3/uL (ref 4.0–10.5)

## 2018-01-11 LAB — BASIC METABOLIC PANEL
BUN: 15 mg/dL (ref 6–23)
CO2: 25 mEq/L (ref 19–32)
CREATININE: 0.76 mg/dL (ref 0.40–1.20)
Calcium: 9.2 mg/dL (ref 8.4–10.5)
Chloride: 104 mEq/L (ref 96–112)
GFR: 80.44 mL/min (ref >60.00)
Glucose, Bld: 89 mg/dL (ref 70–99)
Potassium: 4.6 mEq/L (ref 3.5–5.1)
Sodium: 138 mEq/L (ref 135–145)

## 2018-01-11 LAB — HEPATIC FUNCTION PANEL
ALT: 15 U/L (ref 0–35)
AST: 23 U/L (ref 0–37)
Albumin: 4.1 g/dL (ref 3.5–5.2)
Alkaline Phosphatase: 64 U/L (ref 39–117)
Bilirubin, Direct: 0.1 mg/dL (ref 0.0–0.3)
Total Bilirubin: 0.5 mg/dL (ref 0.2–1.2)
Total Protein: 6.7 g/dL (ref 6.0–8.3)

## 2018-01-11 LAB — TSH: TSH: 1.94 u[IU]/mL (ref 0.35–4.50)

## 2018-01-11 LAB — LIPID PANEL
CHOL/HDL RATIO: 4
Cholesterol: 184 mg/dL (ref 0–200)
HDL: 51.3 mg/dL (ref >39.00)
LDL CALC: 118 mg/dL — AB (ref 0–99)
NonHDL: 132.22
TRIGLYCERIDES: 71 mg/dL (ref 0.0–149.0)
VLDL: 14.2 mg/dL (ref 0.0–40.0)

## 2018-01-31 ENCOUNTER — Ambulatory Visit (INDEPENDENT_AMBULATORY_CARE_PROVIDER_SITE_OTHER): Payer: PPO | Admitting: Internal Medicine

## 2018-01-31 ENCOUNTER — Encounter: Payer: Self-pay | Admitting: Internal Medicine

## 2018-01-31 VITALS — BP 102/70 | HR 89 | Temp 99.2°F | Wt 116.9 lb

## 2018-01-31 DIAGNOSIS — R509 Fever, unspecified: Secondary | ICD-10-CM | POA: Diagnosis not present

## 2018-01-31 DIAGNOSIS — J989 Respiratory disorder, unspecified: Secondary | ICD-10-CM | POA: Diagnosis not present

## 2018-01-31 LAB — POC INFLUENZA A&B (BINAX/QUICKVUE)
Influenza A, POC: NEGATIVE
Influenza B, POC: NEGATIVE

## 2018-01-31 NOTE — Progress Notes (Signed)
Chief Complaint  Patient presents with  . Fever    fever x 1 day. Pt notes having body aches on Friday 11/29/17 and "felt feverish", no thermometer to check. Pt states that this morning her temp was 101 when she was able to check it, took some Tylenol about 3am. Head congestion with runny nose and cough x 4 days. Clear mucous    HPI: BROOKSIE ELLWANGER 67 y.o. come in for  Achy dec pm 6 and then yesterday last night had   100 and taking   And 101 and toolk meds   Uri cold sx.  No rash uti sx cp sob tyleno l helps sx   Had flu vaccine as did GC  Has grandchildren   8 mos and 2 years.  One   had croup they are in day care . ROS: See pertinent positives and negatives per HPI.  Past Medical History:  Diagnosis Date  . Hyperlipidemia     Family History  Problem Relation Age of Onset  . Heart disease Mother 65       CABG  . Cancer Mother        bladder cancer  . Heart disease Father        MVP, atrial fibrilation  . Cancer Father        colon cancer  . Cancer Sister        colon cancer    Social History   Socioeconomic History  . Marital status: Married    Spouse name: Not on file  . Number of children: Not on file  . Years of education: Not on file  . Highest education level: Not on file  Occupational History  . Not on file  Social Needs  . Financial resource strain: Not on file  . Food insecurity:    Worry: Not on file    Inability: Not on file  . Transportation needs:    Medical: Not on file    Non-medical: Not on file  Tobacco Use  . Smoking status: Former Smoker    Packs/day: 1.00    Years: 5.00    Pack years: 5.00    Types: Cigarettes    Last attempt to quit: 05/26/1973    Years since quitting: 44.7  . Smokeless tobacco: Never Used  Substance and Sexual Activity  . Alcohol use: Yes    Comment: 4 x's a wk a glass of wine  . Drug use: Not on file  . Sexual activity: Not on file  Lifestyle  . Physical activity:    Days per week: Not on file    Minutes per  session: Not on file  . Stress: Not on file  Relationships  . Social connections:    Talks on phone: Not on file    Gets together: Not on file    Attends religious service: Not on file    Active member of club or organization: Not on file    Attends meetings of clubs or organizations: Not on file    Relationship status: Not on file  Other Topics Concern  . Not on file  Social History Narrative  . Not on file    Outpatient Medications Prior to Visit  Medication Sig Dispense Refill  . calcium carbonate (OS-CAL) 600 MG TABS Take 600 mg by mouth 2 (two) times daily as needed.     . Cholecalciferol (VITAMIN D3) 1000 UNITS CAPS Take by mouth daily as needed.     . metoprolol tartrate (  LOPRESSOR) 25 MG tablet Take 1 tablet (25 mg total) by mouth as needed. 30 tablet 11  . XARELTO 20 MG TABS tablet TAKE 1 TABLET(20 MG) BY MOUTH DAILY WITH SUPPER 30 tablet 5   No facility-administered medications prior to visit.      EXAM:  BP 102/70 (BP Location: Left Arm, Patient Position: Sitting, Cuff Size: Normal)   Pulse 89   Temp 99.2 F (37.3 C) (Oral)   Wt 116 lb 14.4 oz (53 kg)   LMP  (LMP Unknown)   SpO2 97%   BMI 19.45 kg/m   Body mass index is 19.45 kg/m.  GENERAL: vitals reviewed and listed above, alert, oriented, appears well hydrated and in no acute distress HEENT: atraumatic, conjunctiva  clear, no obvious abnormalities on inspection of external nose and ears tmx clear OP : no lesion edema or exudate  NECK: no obvious masses on inspection palpation  LUNGS: clear to auscultation bilaterally, no wheezes, rales or rhonchi, good air movement CV: HRRR, no clubbing cyanosis or  peripheral edema nl cap refill  MS: moves all extremities without noticeable focal  Abnormality Abdomen:  Sof,t normal bowel sounds without hepatosplenomegaly, no guarding rebound or masses no CVA tenderness  PSYCH: pleasant and cooperative, no obvious depression or anxiety Lab Results  Component Value Date    WBC 4.5 01/11/2018   HGB 14.5 01/11/2018   HCT 42.5 01/11/2018   PLT 276.0 01/11/2018   GLUCOSE 89 01/11/2018   CHOL 184 01/11/2018   TRIG 71.0 01/11/2018   HDL 51.30 01/11/2018   LDLDIRECT 142.6 03/04/2010   LDLCALC 118 (H) 01/11/2018   ALT 15 01/11/2018   AST 23 01/11/2018   NA 138 01/11/2018   K 4.6 01/11/2018   CL 104 01/11/2018   CREATININE 0.76 01/11/2018   BUN 15 01/11/2018   CO2 25 01/11/2018   TSH 1.94 01/11/2018   BP Readings from Last 3 Encounters:  01/31/18 102/70  01/09/18 120/80  03/21/17 120/62   poct flu negative  ASSESSMENT AND PLAN:  Discussed the following assessment and plan:  Respiratory illness with fever - Plan: POC Influenza A&B (Binax test) prob viral  Cause    Expectant management. And fu if   Worsening  -Patient advised to return or notify health care team  if  new concerns arise.  Patient Instructions  Exam is reassuing and  Consistent with  Viral respiratory infection  Fever should resolve in next 2 days  Cough congestion may bet worse before better .  Rest fluids and supportive care .   Flu screen is negatvie   Mariann Laster K. Panosh M.D.

## 2018-01-31 NOTE — Patient Instructions (Addendum)
Exam is reassuing and  Consistent with  Viral respiratory infection  Fever should resolve in next 2 days  Cough congestion may bet worse before better .  Rest fluids and supportive care .   Flu screen is negatvie

## 2018-02-16 ENCOUNTER — Encounter: Payer: Self-pay | Admitting: Cardiology

## 2018-03-11 ENCOUNTER — Other Ambulatory Visit: Payer: Self-pay | Admitting: Physician Assistant

## 2018-03-15 ENCOUNTER — Ambulatory Visit: Payer: PPO | Admitting: Cardiology

## 2018-03-15 ENCOUNTER — Encounter: Payer: Self-pay | Admitting: Cardiology

## 2018-03-15 VITALS — BP 120/70 | HR 75 | Ht 65.0 in | Wt 117.2 lb

## 2018-03-15 DIAGNOSIS — E782 Mixed hyperlipidemia: Secondary | ICD-10-CM | POA: Diagnosis not present

## 2018-03-15 DIAGNOSIS — I6521 Occlusion and stenosis of right carotid artery: Secondary | ICD-10-CM | POA: Diagnosis not present

## 2018-03-15 DIAGNOSIS — I480 Paroxysmal atrial fibrillation: Secondary | ICD-10-CM | POA: Diagnosis not present

## 2018-03-15 DIAGNOSIS — Z7901 Long term (current) use of anticoagulants: Secondary | ICD-10-CM | POA: Diagnosis not present

## 2018-03-15 MED ORDER — METOPROLOL TARTRATE 25 MG PO TABS: 25.0000 mg | ORAL_TABLET | ORAL | 11 refills | Status: DC | PRN

## 2018-03-15 NOTE — Progress Notes (Signed)
Cardiology Office Note:    Date:  03/15/2018   ID:  Tammy [name], DOB [birth year], MRN [id number]  PCP:  Eulas Post, MD  Cardiologist:  Candee Furbish, MD  Electrophysiologist:  None   Referring MD: Eulas Post, MD     History of Present Illness:    Tammy [name] is a 68 y.o. female here for follow-up of atrial fibrillation previously rapid ventricular response of 141 bpm who spontaneously converted.  Episode was 3 hours in duration.  Her CHADSVASc score at that time was 2.  Placed on Xarelto.  Troponins were normal.  EF normal.  Has had PACs for many years remembers her that her father had PACs as well.  Her father had atrial fibrillation but refused to take anticoagulation and ended up having a stroke.  Mother had CABG in her 41s.  Overall has been doing quite well.  No fevers chills nausea vomiting syncope bleeding.  No real issues with Xarelto.  Occasional nicking during shaving.  Very rare palpitations, PACs.  1 of her sisters had carotid stenosis bilaterally and stroke.  Past Medical History:  Diagnosis Date  . Hyperlipidemia     Past Surgical History:  Procedure Laterality Date  . DILATION AND CURETTAGE OF UTERUS N/A     Current Medications: Current Meds  Medication Sig  . metoprolol tartrate (LOPRESSOR) 25 MG tablet Take 1 tablet (25 mg total) by mouth as needed.  Alveda Reasons 20 MG TABS tablet TAKE 1 TABLET(20 MG) BY MOUTH DAILY WITH SUPPER  . [DISCONTINUED] metoprolol tartrate (LOPRESSOR) 25 MG tablet Take 1 tablet (25 mg total) by mouth as needed.     Allergies:   Penicillins; Codeine; and Contrast media [iodinated diagnostic agents]   Social History   Socioeconomic History  . Marital status: Married    Spouse name: Not on file  . Number of children: Not on file  . Years of education: Not on file  . Highest education level: Not on file  Occupational History  . Not on file  Social Needs  . Financial resource strain: Not on file  . Food  insecurity:    Worry: Not on file    Inability: Not on file  . Transportation needs:    Medical: Not on file    Non-medical: Not on file  Tobacco Use  . Smoking status: Former Smoker    Packs/day: 1.00    Years: 5.00    Pack years: 5.00    Types: Cigarettes    Last attempt to quit: 05/26/1973    Years since quitting: 44.8  . Smokeless tobacco: Never Used  Substance and Sexual Activity  . Alcohol use: Yes    Comment: 4 x's a wk a glass of wine  . Drug use: Never  . Sexual activity: Not on file  Lifestyle  . Physical activity:    Days per week: Not on file    Minutes per session: Not on file  . Stress: Not on file  Relationships  . Social connections:    Talks on phone: Not on file    Gets together: Not on file    Attends religious service: Not on file    Active member of club or organization: Not on file    Attends meetings of clubs or organizations: Not on file    Relationship status: Not on file  Other Topics Concern  . Not on file  Social History Narrative  . Not on file     Family  History: The patient's family history includes Cancer in her father, mother, and sister; Heart disease in her father; Heart disease (age of onset: 76) in her mother.  ROS:   Please see the history of present illness.     All other systems reviewed and are negative.  EKGs/Labs/Other Studies Reviewed:    The following studies were reviewed today: Prior office notes EKG lab work  Echocardiogram 02/19/2016-EF 60%, trivial pericardial effusion.  Mild right carotid artery plaque on ultrasound 05/26/2017  EKG:  EKG is  ordered today.  The ekg ordered today demonstrates 03/15/2018- normal rhythm sinus arrhythmia biatrial enlargement personally reviewed and interpreted heart rate 75 bpm  Recent Labs: 01/11/2018: ALT 15; BUN 15; Creatinine, Ser 0.76; Hemoglobin 14.5; Platelets 276.0; Potassium 4.6; Sodium 138; TSH 1.94  Recent Lipid Panel    Component Value Date/Time   CHOL 184 01/11/2018  0829   TRIG 71.0 01/11/2018 0829   HDL 51.30 01/11/2018 0829   CHOLHDL 4 01/11/2018 0829   VLDL 14.2 01/11/2018 0829   LDLCALC 118 (H) 01/11/2018 0829   LDLDIRECT 142.6 03/04/2010 1001    Physical Exam:    VS:  BP 120/70   Pulse 75   Ht 5\' 5"  (1.651 m)   Wt 117 lb 3.2 oz (53.2 kg)   LMP  (LMP Unknown)   SpO2 96%   BMI 19.50 kg/m     Wt Readings from Last 3 Encounters:  03/15/18 117 lb 3.2 oz (53.2 kg)  01/31/18 116 lb 14.4 oz (53 kg)  01/09/18 116 lb (52.6 kg)     GEN:  Thin in no acute distress HEENT: Normal NECK: No JVD; No carotid bruits LYMPHATICS: No lymphadenopathy CARDIAC: RRR, no murmurs, rubs, gallops RESPIRATORY:  Clear to auscultation without rales, wheezing or rhonchi  ABDOMEN: Soft, non-tender, non-distended MUSCULOSKELETAL:  No edema; No deformity  SKIN: Warm and dry NEUROLOGIC:  Alert and oriented x 3 PSYCHIATRIC:  Normal affect   ASSESSMENT:    1. Paroxysmal atrial fibrillation (HCC)   2. Chronic anticoagulation   3. Mixed hyperlipidemia   4. Carotid artery plaque, right    PLAN:    In order of problems listed above:  Paroxysmal atrial fibrillation/flutter - 3-hour prior episode, perhaps a glass of wine triggered it.  Echocardiogram reassuring.  Decrease caffeine.  Has family history of A. fib.  Father stroke without anticoagulation.  Takes metoprolol on as-needed basis.  Refilling metoprolol  Hyperlipidemia LDL 127, not previously interested in statin therapy.  Continuing with diet.  Reasonable.  Chronic anticoagulation, on Xarelto.  Doing well, no bleeding episodes.  CHADSVASc technically 3 if we include age, female, carotid artery plaque  Carotid artery plaque -Mild right with no significant stenosis  1 year follow-up  Medication Adjustments/Labs and Tests Ordered: Current medicines are reviewed at length with the patient today.  Concerns regarding medicines are outlined above.  Orders Placed This Encounter  Procedures  . EKG 12-Lead    Meds ordered this encounter  Medications  . metoprolol tartrate (LOPRESSOR) 25 MG tablet    Sig: Take 1 tablet (25 mg total) by mouth as needed.    Dispense:  30 tablet    Refill:  11    Patient Instructions  Medication Instructions:  The current medical regimen is effective;  continue present plan and medications.  If you need a refill on your cardiac medications before your next appointment, please call your pharmacy.   Follow-Up: At [facility], you and your health needs are our priority.  As part of our continuing mission to provide you with exceptional heart care, we have created designated Provider Care Teams.  These Care Teams include your primary Cardiologist (physician) and Advanced Practice Providers (APPs -  Physician Assistants and Nurse Practitioners) who all work together to provide you with the care you need, when you need it. You will need a follow up appointment in 12 months.  Please call our office 2 months in advance to schedule this appointment.  You may see Candee Furbish, MD or one of the following Advanced Practice Providers on your designated Care Team:   Truitt Merle, NP Cecilie Kicks, NP . Kathyrn Drown, NP  Thank you for choosing [facility]!!         Signed, Candee Furbish, MD  03/15/2018 2:21 PM    [place]

## 2018-03-15 NOTE — Patient Instructions (Signed)
Medication Instructions:  The current medical regimen is effective;  continue present plan and medications.  If you need a refill on your cardiac medications before your next appointment, please call your pharmacy.   Follow-Up: At [facility], you and your health needs are our priority.  As part of our continuing mission to provide you with exceptional heart care, we have created designated Provider Care Teams.  These Care Teams include your primary Cardiologist (physician) and Advanced Practice Providers (APPs -  Physician Assistants and Nurse Practitioners) who all work together to provide you with the care you need, when you need it. You will need a follow up appointment in 12 months.  Please call our office 2 months in advance to schedule this appointment.  You may see Mark Skains, MD or one of the following Advanced Practice Providers on your designated Care Team:   Lori Gerhardt, NP Laura Ingold, NP . Jill McDaniel, NP  Thank you for choosing [facility]!!      

## 2018-04-13 DIAGNOSIS — Z124 Encounter for screening for malignant neoplasm of cervix: Secondary | ICD-10-CM | POA: Diagnosis not present

## 2018-04-13 DIAGNOSIS — Z01419 Encounter for gynecological examination (general) (routine) without abnormal findings: Secondary | ICD-10-CM | POA: Diagnosis not present

## 2018-04-13 DIAGNOSIS — Z681 Body mass index (BMI) 19 or less, adult: Secondary | ICD-10-CM | POA: Diagnosis not present

## 2018-04-27 ENCOUNTER — Ambulatory Visit: Payer: PPO | Admitting: Podiatry

## 2018-04-27 ENCOUNTER — Ambulatory Visit (INDEPENDENT_AMBULATORY_CARE_PROVIDER_SITE_OTHER): Payer: PPO

## 2018-04-27 ENCOUNTER — Encounter: Payer: Self-pay | Admitting: Podiatry

## 2018-04-27 VITALS — BP 129/72 | HR 74 | Resp 16

## 2018-04-27 DIAGNOSIS — M2012 Hallux valgus (acquired), left foot: Secondary | ICD-10-CM

## 2018-04-27 DIAGNOSIS — M722 Plantar fascial fibromatosis: Secondary | ICD-10-CM

## 2018-04-27 DIAGNOSIS — M2011 Hallux valgus (acquired), right foot: Secondary | ICD-10-CM

## 2018-04-27 DIAGNOSIS — Q513 Bicornate uterus: Secondary | ICD-10-CM | POA: Insufficient documentation

## 2018-04-27 MED ORDER — TRIAMCINOLONE ACETONIDE 10 MG/ML IJ SUSP
10.0000 mg | Freq: Once | INTRAMUSCULAR | Status: AC
  Administered 2018-04-27: 10 mg

## 2018-04-27 NOTE — Progress Notes (Signed)
   Subjective:    Patient ID: Tammy [name], female    DOB: 10-19-1950, 68 y.o.   MRN: [id number]  HPI    Review of Systems  All other systems reviewed and are negative.      Objective:   Physical Exam        Assessment & Plan:

## 2018-04-27 NOTE — Patient Instructions (Addendum)
Bunion  A bunion is a bump on the base of the big toe that forms when the bones of the big toe joint move out of position. Bunions may be small at first, but they often get larger over time. They can make walking painful. What are the causes? A bunion may be caused by:  Wearing narrow or pointed shoes that force the big toe to press against the other toes.  Abnormal foot development that causes the foot to roll inward (pronate).  Changes in the foot that are caused by certain diseases, such as rheumatoid arthritis or polio.  A foot injury. What increases the risk? The following factors may make you more likely to develop this condition:  Wearing shoes that squeeze the toes together.  Having certain diseases, such as: ? Rheumatoid arthritis. ? Polio. ? Cerebral palsy.  Having family members who have bunions.  Being born with a foot deformity, such as flat feet or low arches.  Doing activities that put a lot of pressure on the feet, such as ballet dancing. What are the signs or symptoms? The main symptom of a bunion is a noticeable bump on the big toe. Other symptoms may include:  Pain.  Swelling around the big toe.  Redness and inflammation.  Thick or hardened skin on the big toe or between the toes.  Stiffness or loss of motion in the big toe.  Trouble with walking. How is this diagnosed? A bunion may be diagnosed based on your symptoms, medical history, and activities. You may have tests, such as:  X-rays. These allow your health care provider to check the position of the bones in your foot and look for damage to your joint. They also help your health care provider determine the severity of your bunion and the best way to treat it.  Joint aspiration. In this test, a sample of fluid is removed from the toe joint. This test may be done if you are in a lot of pain. It helps rule out diseases that cause painful swelling of the joints, such as arthritis. How is this  treated? Treatment depends on the severity of your symptoms. The goal of treatment is to relieve symptoms and prevent the bunion from getting worse. Your health care provider may recommend:  Wearing shoes that have a wide toe box.  Using bunion pads to cushion the affected area.  Taping your toes together to keep them in a normal position.  Placing a device inside your shoe (orthotics) to help reduce pressure on your toe joint.  Taking medicine to ease pain, inflammation, and swelling.  Applying heat or ice to the affected area.  Doing stretching exercises.  Surgery to remove scar tissue and move the toes back into their normal position. This treatment is rare. Follow these instructions at home: Managing pain, stiffness, and swelling   If directed, put ice on the painful area: ? Put ice in a plastic bag. ? Place a towel between your skin and the bag. ? Leave the ice on for 20 minutes, 2-3 times a day. Activity   If directed, apply heat to the affected area before you exercise. Use the heat source that your health care provider recommends, such as a moist heat pack or a heating pad. ? Place a towel between your skin and the heat source. ? Leave the heat on for 20-30 minutes. ? Remove the heat if your skin turns bright red. This is especially important if you are unable to feel pain,   heat, or cold. You may have a greater risk of getting burned.  Do exercises as told by your health care provider. General instructions  Support your toe joint with proper footwear, shoe padding, or taping as told by your health care provider.  Take over-the-counter and prescription medicines only as told by your health care provider.  Keep all follow-up visits as told by your health care provider. This is important. Contact a health care provider if your symptoms:  Get worse.  Do not improve in 2 weeks. Get help right away if you have:  Severe pain and trouble with walking. Summary  A  bunion is a bump on the base of the big toe that forms when the bones of the big toe joint move out of position.  Bunions can make walking painful.  Treatment depends on the severity of your symptoms.  Support your toe joint with proper footwear, shoe padding, or taping as told by your health care provider. This information is not intended to replace advice given to you by your health care provider. Make sure you discuss any questions you have with your health care provider. Document Released: 02/08/2005 Document Revised: 06/21/2017 Document Reviewed: 06/21/2017 [employer] Interactive Patient Education  2019 [employer] Plantar Fasciitis (Heel Spur Syndrome) with Rehab The plantar fascia is a fibrous, ligament-like, soft-tissue structure that spans the bottom of the foot. Plantar fasciitis is a condition that causes pain in the foot due to inflammation of the tissue. SYMPTOMS   Pain and tenderness on the underneath side of the foot.  Pain that worsens with standing or walking. CAUSES  Plantar fasciitis is caused by irritation and injury to the plantar fascia on the underneath side of the foot. Common mechanisms of injury include:  Direct trauma to bottom of the foot.  Damage to a small nerve that runs under the foot where the main fascia attaches to the heel bone.  Stress placed on the plantar fascia due to bone spurs. RISK INCREASES WITH:   Activities that place stress on the plantar fascia (running, jumping, pivoting, or cutting).  Poor strength and flexibility.  Improperly fitted shoes.  Tight calf muscles.  Flat feet.  Failure to warm-up properly before activity.  Obesity. PREVENTION  Warm up and stretch properly before activity.  Allow for adequate recovery between workouts.  Maintain physical fitness:  Strength, flexibility, and endurance.  Cardiovascular fitness.  Maintain a health body weight.  Avoid stress on the plantar fascia.  Wear properly fitted  shoes, including arch supports for individuals who have flat feet.  PROGNOSIS  If treated properly, then the symptoms of plantar fasciitis usually resolve without surgery. However, occasionally surgery is necessary.  RELATED COMPLICATIONS   Recurrent symptoms that may result in a chronic condition.  Problems of the lower back that are caused by compensating for the injury, such as limping.  Pain or weakness of the foot during push-off following surgery.  Chronic inflammation, scarring, and partial or complete fascia tear, occurring more often from repeated injections.  TREATMENT  Treatment initially involves the use of ice and medication to help reduce pain and inflammation. The use of strengthening and stretching exercises may help reduce pain with activity, especially stretches of the Achilles tendon. These exercises may be performed at home or with a therapist. Your caregiver may recommend that you use heel cups of arch supports to help reduce stress on the plantar fascia. Occasionally, corticosteroid injections are given to reduce inflammation. If symptoms persist for greater than 6  months despite non-surgical (conservative), then surgery may be recommended.   MEDICATION   If pain medication is necessary, then nonsteroidal anti-inflammatory medications, such as aspirin and ibuprofen, or other minor pain relievers, such as acetaminophen, are often recommended.  Do not take pain medication within 7 days before surgery.  Prescription pain relievers may be given if deemed necessary by your caregiver. Use only as directed and only as much as you need.  Corticosteroid injections may be given by your caregiver. These injections should be reserved for the most serious cases, because they may only be given a certain number of times.  HEAT AND COLD  Cold treatment (icing) relieves pain and reduces inflammation. Cold treatment should be applied for 10 to 15 minutes every 2 to 3 hours for  inflammation and pain and immediately after any activity that aggravates your symptoms. Use ice packs or massage the area with a piece of ice (ice massage).  Heat treatment may be used prior to performing the stretching and strengthening activities prescribed by your caregiver, physical therapist, or athletic trainer. Use a heat pack or soak the injury in warm water.  SEEK IMMEDIATE MEDICAL CARE IF:  Treatment seems to offer no benefit, or the condition worsens.  Any medications produce adverse side effects.  EXERCISES- RANGE OF MOTION (ROM) AND STRETCHING EXERCISES - Plantar Fasciitis (Heel Spur Syndrome) These exercises may help you when beginning to rehabilitate your injury. Your symptoms may resolve with or without further involvement from your physician, physical therapist or athletic trainer. While completing these exercises, remember:   Restoring tissue flexibility helps normal motion to return to the joints. This allows healthier, less painful movement and activity.  An effective stretch should be held for at least 30 seconds.  A stretch should never be painful. You should only feel a gentle lengthening or release in the stretched tissue.  RANGE OF MOTION - Toe Extension, Flexion  Sit with your right / left leg crossed over your opposite knee.  Grasp your toes and gently pull them back toward the top of your foot. You should feel a stretch on the bottom of your toes and/or foot.  Hold this stretch for 10 seconds.  Now, gently pull your toes toward the bottom of your foot. You should feel a stretch on the top of your toes and or foot.  Hold this stretch for 10 seconds. Repeat  times. Complete this stretch 3 times per day.   RANGE OF MOTION - Ankle Dorsiflexion, Active Assisted  Remove shoes and sit on a chair that is preferably not on a carpeted surface.  Place right / left foot under knee. Extend your opposite leg for support.  Keeping your heel down, slide your right /  left foot back toward the chair until you feel a stretch at your ankle or calf. If you do not feel a stretch, slide your bottom forward to the edge of the chair, while still keeping your heel down.  Hold this stretch for 10 seconds. Repeat 3 times. Complete this stretch 2 times per day.   STRETCH  Gastroc, Standing  Place hands on wall.  Extend right / left leg, keeping the front knee somewhat bent.  Slightly point your toes inward on your back foot.  Keeping your right / left heel on the floor and your knee straight, shift your weight toward the wall, not allowing your back to arch.  You should feel a gentle stretch in the right / left calf. Hold this position for  10 seconds. Repeat 3 times. Complete this stretch 2 times per day.  STRETCH  Soleus, Standing  Place hands on wall.  Extend right / left leg, keeping the other knee somewhat bent.  Slightly point your toes inward on your back foot.  Keep your right / left heel on the floor, bend your back knee, and slightly shift your weight over the back leg so that you feel a gentle stretch deep in your back calf.  Hold this position for 10 seconds. Repeat 3 times. Complete this stretch 2 times per day.  STRETCH  Gastrocsoleus, Standing  Note: This exercise can place a lot of stress on your foot and ankle. Please complete this exercise only if specifically instructed by your caregiver.   Place the ball of your right / left foot on a step, keeping your other foot firmly on the same step.  Hold on to the wall or a rail for balance.  Slowly lift your other foot, allowing your body weight to press your heel down over the edge of the step.  You should feel a stretch in your right / left calf.  Hold this position for 10 seconds.  Repeat this exercise with a slight bend in your right / left knee. Repeat 3 times. Complete this stretch 2 times per day.   STRENGTHENING EXERCISES - Plantar Fasciitis (Heel Spur Syndrome)  These  exercises may help you when beginning to rehabilitate your injury. They may resolve your symptoms with or without further involvement from your physician, physical therapist or athletic trainer. While completing these exercises, remember:   Muscles can gain both the endurance and the strength needed for everyday activities through controlled exercises.  Complete these exercises as instructed by your physician, physical therapist or athletic trainer. Progress the resistance and repetitions only as guided.  STRENGTH - Towel Curls  Sit in a chair positioned on a non-carpeted surface.  Place your foot on a towel, keeping your heel on the floor.  Pull the towel toward your heel by only curling your toes. Keep your heel on the floor. Repeat 3 times. Complete this exercise 2 times per day.  STRENGTH - Ankle Inversion  Secure one end of a rubber exercise band/tubing to a fixed object (table, pole). Loop the other end around your foot just before your toes.  Place your fists between your knees. This will focus your strengthening at your ankle.  Slowly, pull your big toe up and in, making sure the band/tubing is positioned to resist the entire motion.  Hold this position for 10 seconds.  Have your muscles resist the band/tubing as it slowly pulls your foot back to the starting position. Repeat 3 times. Complete this exercises 2 times per day.  Document Released: 02/08/2005 Document Revised: 05/03/2011 Document Reviewed: 05/23/2008 [facility] Patient Information 2014 [place], [place].

## 2018-04-28 ENCOUNTER — Telehealth: Payer: Self-pay | Admitting: Podiatry

## 2018-04-28 NOTE — Telephone Encounter (Signed)
Advised patient she should let the inflammation calm down for the next week before any intense activity. Can resume play the following week and take notice on how it felt afterwards.

## 2018-04-28 NOTE — Telephone Encounter (Signed)
Patient was seen this week for bunion and heel pain and forgot to ask if she is still able to continue with playing tennis.

## 2018-04-30 NOTE — Progress Notes (Signed)
Subjective:   Patient ID: Tammy [name], female   DOB: 68 y.o.   MRN: [id number]   HPI Patient presents with large bunion deformity bilateral that are moderately tender and getting worse gradually over time and significant discomfort in the plantar aspect of the right heel which is gotten worse over the last few months and is gradually making it harder to be active.  Patient has good digital perfusion well oriented x3   ROS      Objective:  Physical Exam  Neurovascular status intact muscle strength adequate range of motion within normal limits with patient found to have exquisite discomfort plantar aspect right heel at the insertional point tendon into the calcaneus and is noted to have significant bunion deformity bilateral with redness and pain around the joint surface.  Patient has good digital perfusion well oriented x3     Assessment:  Acute plantar fasciitis right with moderate bunion deformity bilateral that are becoming slowly symptomatic as time goes on with family history     Plan:  H&P conditions reviewed and I recommended treating the fasciitis first and I did inject the plantar fascia 3 mg Kenalog 5 mg Xylocaine and applied fascial brace.  Discussed bunions and educated her on this and at one point in future we will consider correction and I do think distal osteotomy while not being able to give her full correction will give her adequate correction at this current time  X-rays indicate there is significant structural malalignment of the first metatarsal bilateral with elevation of the ankle bilateral

## 2018-05-11 ENCOUNTER — Ambulatory Visit: Payer: PPO | Admitting: Podiatry

## 2018-08-09 DIAGNOSIS — M816 Localized osteoporosis [Lequesne]: Secondary | ICD-10-CM | POA: Diagnosis not present

## 2018-08-09 DIAGNOSIS — N958 Other specified menopausal and perimenopausal disorders: Secondary | ICD-10-CM | POA: Diagnosis not present

## 2018-08-17 DIAGNOSIS — L821 Other seborrheic keratosis: Secondary | ICD-10-CM | POA: Diagnosis not present

## 2018-08-17 DIAGNOSIS — Z86018 Personal history of other benign neoplasm: Secondary | ICD-10-CM | POA: Diagnosis not present

## 2018-08-17 DIAGNOSIS — L820 Inflamed seborrheic keratosis: Secondary | ICD-10-CM | POA: Diagnosis not present

## 2018-08-17 DIAGNOSIS — D2272 Melanocytic nevi of left lower limb, including hip: Secondary | ICD-10-CM | POA: Diagnosis not present

## 2018-08-17 DIAGNOSIS — D485 Neoplasm of uncertain behavior of skin: Secondary | ICD-10-CM | POA: Diagnosis not present

## 2018-08-17 DIAGNOSIS — D225 Melanocytic nevi of trunk: Secondary | ICD-10-CM | POA: Diagnosis not present

## 2018-08-29 DIAGNOSIS — M810 Age-related osteoporosis without current pathological fracture: Secondary | ICD-10-CM | POA: Diagnosis not present

## 2018-09-07 ENCOUNTER — Other Ambulatory Visit: Payer: Self-pay | Admitting: Physician Assistant

## 2018-09-07 NOTE — Telephone Encounter (Signed)
Prescription refill request for Xarelto received.   Last office visit: Dr. Marlou Porch (03-15-2018) Weight:  53.2 kg ( 03-15-2018) Age: 68 y.o. Scr: 0.76 (01-11-2018) CrCl: 60 ml/min  Prescription refill sent.

## 2018-10-05 ENCOUNTER — Other Ambulatory Visit: Payer: Self-pay

## 2018-10-05 ENCOUNTER — Encounter: Payer: Self-pay | Admitting: Podiatry

## 2018-10-05 ENCOUNTER — Ambulatory Visit: Payer: PPO | Admitting: Podiatry

## 2018-10-05 VITALS — Temp 97.1°F

## 2018-10-05 DIAGNOSIS — M2011 Hallux valgus (acquired), right foot: Secondary | ICD-10-CM

## 2018-10-05 DIAGNOSIS — M722 Plantar fascial fibromatosis: Secondary | ICD-10-CM | POA: Diagnosis not present

## 2018-10-05 DIAGNOSIS — M2012 Hallux valgus (acquired), left foot: Secondary | ICD-10-CM

## 2018-10-05 NOTE — Patient Instructions (Signed)
Plantar Fasciitis Rehab Ask your health care provider which exercises are safe for you. Do exercises exactly as told by your health care provider and adjust them as directed. It is normal to feel mild stretching, pulling, tightness, or discomfort as you do these exercises. Stop right away if you feel sudden pain or your pain gets worse. Do not begin these exercises until told by your health care provider. Stretching and range-of-motion exercises These exercises warm up your muscles and joints and improve the movement and flexibility of your foot. These exercises also help to relieve pain. Plantar fascia stretch  1. Sit with your left / right leg crossed over your opposite knee. 2. Hold your heel with one hand with that thumb near your arch. With your other hand, hold your toes and gently pull them back toward the top of your foot. You should feel a stretch on the bottom of your toes or your foot (plantar fascia) or both. 3. Hold this stretch for__________ seconds. 4. Slowly release your toes and return to the starting position. Repeat __________ times. Complete this exercise __________ times a day. Gastrocnemius stretch, standing This exercise is also called a calf (gastroc) stretch. It stretches the muscles in the back of the upper calf. 1. Stand with your hands against a wall. 2. Extend your left / right leg behind you, and bend your front knee slightly. 3. Keeping your heels on the floor and your back knee straight, shift your weight toward the wall. Do not arch your back. You should feel a gentle stretch in your upper left / right calf. 4. Hold this position for __________ seconds. Repeat __________ times. Complete this exercise __________ times a day. Soleus stretch, standing This exercise is also called a calf (soleus) stretch. It stretches the muscles in the back of the lower calf. 1. Stand with your hands against a wall. 2. Extend your left / right leg behind you, and bend your front  knee slightly. 3. Keeping your heels on the floor, bend your back knee and shift your weight slightly over your back leg. You should feel a gentle stretch deep in your lower calf. 4. Hold this position for __________ seconds. Repeat __________ times. Complete this exercise __________ times a day. Gastroc and soleus stretch, standing step This exercise stretches the muscles in the back of the lower leg. These muscles are in the upper calf (gastrocnemius) and the lower calf (soleus). 1. Stand with the ball of your left / right foot on a step. The ball of your foot is on the walking surface, right under your toes. 2. Keep your other foot firmly on the same step. 3. Hold on to the wall or a railing for balance. 4. Slowly lift your other foot, allowing your body weight to press your left / right heel down over the edge of the step. You should feel a stretch in your left / right calf. 5. Hold this position for __________ seconds. 6. Return both feet to the step. 7. Repeat this exercise with a slight bend in your left / right knee. Repeat __________ times with your left / right knee straight and __________ times with your left / right knee bent. Complete this exercise __________ times a day. Balance exercise This exercise builds your balance and strength control of your arch to help take pressure off your plantar fascia. Single leg stand If this exercise is too easy, you can try it with your eyes closed or while standing on a pillow. 1.   Without shoes, stand near a railing or in a doorway. You may hold on to the railing or door frame as needed. 2. Stand on your left / right foot. Keep your big toe down on the floor and try to keep your arch lifted. Do not let your foot roll inward. 3. Hold this position for __________ seconds. Repeat __________ times. Complete this exercise __________ times a day. This information is not intended to replace advice given to you by your health care provider. Make sure  you discuss any questions you have with your health care provider. Document Released: 02/08/2005 Document Revised: 06/01/2018 Document Reviewed: 12/07/2017 Elsevier Patient Education  2020 [employer]  

## 2018-10-05 NOTE — Progress Notes (Signed)
Subjective:   Patient ID: Tammy [name], female   DOB: 68 y.o.   MRN: [id number]   HPI Patient presents that she has had significant reoccurrence of heel pain right and is not as bad but she is able to play tennis but she has pain and at times only can walk on her toes due to the discomfort.  States it is worse after periods of sitting and when getting up in the morning   ROS      Objective:  Physical Exam  Acute plantar fasciitis right at the insertion of the tendon into the calcaneus with a diminished fat pad as part of the pathology she is experiencing     Assessment:  Discomfort that is at the plantar fascial calcaneal insertion with a calcaneus that is quite prominent secondary to diminished fat pad     Plan:  H&P discussed condition.  I did a careful steroid injection 3 mg dexamethasone Kenalog 5 mg Xylocaine into the plantar fascia to reduce the acute inflammation and placed her into a night splint in order to stretch it properly with advice long-term for orthotics and recommended a soft type orthotic to cushion the bottom of the heel and I am referring her to ped orthotist for these to be made.  Patient will be seen back when orthotics are completed

## 2018-10-23 ENCOUNTER — Other Ambulatory Visit: Payer: Self-pay

## 2018-10-23 ENCOUNTER — Other Ambulatory Visit: Payer: PPO | Admitting: Orthotics

## 2018-10-23 DIAGNOSIS — M722 Plantar fascial fibromatosis: Secondary | ICD-10-CM | POA: Diagnosis not present

## 2018-11-06 ENCOUNTER — Ambulatory Visit (INDEPENDENT_AMBULATORY_CARE_PROVIDER_SITE_OTHER): Payer: PPO | Admitting: Family Medicine

## 2018-11-06 ENCOUNTER — Encounter: Payer: Self-pay | Admitting: Family Medicine

## 2018-11-06 ENCOUNTER — Other Ambulatory Visit: Payer: Self-pay

## 2018-11-06 VITALS — BP 130/70 | HR 52 | Temp 97.3°F | Wt 112.3 lb

## 2018-11-06 DIAGNOSIS — K219 Gastro-esophageal reflux disease without esophagitis: Secondary | ICD-10-CM | POA: Diagnosis not present

## 2018-11-06 DIAGNOSIS — R6889 Other general symptoms and signs: Secondary | ICD-10-CM | POA: Diagnosis not present

## 2018-11-06 DIAGNOSIS — R0989 Other specified symptoms and signs involving the circulatory and respiratory systems: Secondary | ICD-10-CM

## 2018-11-06 NOTE — Patient Instructions (Signed)
Food Choices for Gastroesophageal Reflux Disease, Adult When you have gastroesophageal reflux disease (GERD), the foods you eat and your eating habits are very important. Choosing the right foods can help ease the discomfort of GERD. Consider working with a diet and nutrition specialist (dietitian) to help you make healthy food choices. What general guidelines should I follow?  Eating plan  Choose healthy foods low in fat, such as fruits, vegetables, whole grains, low-fat dairy products, and lean meat, fish, and poultry.  Eat frequent, small meals instead of three large meals each day. Eat your meals slowly, in a relaxed setting. Avoid bending over or lying down until 2-3 hours after eating.  Limit high-fat foods such as fatty meats or fried foods.  Limit your intake of oils, butter, and shortening to less than 8 teaspoons each day.  Avoid the following: ? Foods that cause symptoms. These may be different for different people. Keep a food diary to keep track of foods that cause symptoms. ? Alcohol. ? Drinking large amounts of liquid with meals. ? Eating meals during the 2-3 hours before bed.  Cook foods using methods other than frying. This may include baking, grilling, or broiling. Lifestyle  Maintain a healthy weight. Ask your health care provider what weight is healthy for you. If you need to lose weight, work with your health care provider to do so safely.  Exercise for at least 30 minutes on 5 or more days each week, or as told by your health care provider.  Avoid wearing clothes that fit tightly around your waist and chest.  Do not use any products that contain nicotine or tobacco, such as cigarettes and e-cigarettes. If you need help quitting, ask your health care provider.  Sleep with the head of your bed raised. Use a wedge under the mattress or blocks under the bed frame to raise the head of the bed. What foods are not recommended? The items listed may not be a complete  list. Talk with your dietitian about what dietary choices are best for you. Grains Pastries or quick breads with added fat. French toast. Vegetables Deep fried vegetables. French fries. Any vegetables prepared with added fat. Any vegetables that cause symptoms. For some people this may include tomatoes and tomato products, chili peppers, onions and garlic, and horseradish. Fruits Any fruits prepared with added fat. Any fruits that cause symptoms. For some people this may include citrus fruits, such as oranges, grapefruit, pineapple, and lemons. Meats and other protein foods High-fat meats, such as fatty beef or pork, hot dogs, ribs, ham, sausage, salami and bacon. Fried meat or protein, including fried fish and fried chicken. Nuts and nut butters. Dairy Whole milk and chocolate milk. Sour cream. Cream. Ice cream. Cream cheese. Milk shakes. Beverages Coffee and tea, with or without caffeine. Carbonated beverages. Sodas. Energy drinks. Fruit juice made with acidic fruits (such as orange or grapefruit). Tomato juice. Alcoholic drinks. Fats and oils Butter. Margarine. Shortening. Ghee. Sweets and desserts Chocolate and cocoa. Donuts. Seasoning and other foods Pepper. Peppermint and spearmint. Any condiments, herbs, or seasonings that cause symptoms. For some people, this may include curry, hot sauce, or vinegar-based salad dressings. Summary  When you have gastroesophageal reflux disease (GERD), food and lifestyle choices are very important to help ease the discomfort of GERD.  Eat frequent, small meals instead of three large meals each day. Eat your meals slowly, in a relaxed setting. Avoid bending over or lying down until 2-3 hours after eating.  Limit high-fat   foods such as fatty meat or fried foods. This information is not intended to replace advice given to you by your health care provider. Make sure you discuss any questions you have with your health care provider. Document Released:  02/08/2005 Document Revised: 06/01/2018 Document Reviewed: 02/10/2016 Elsevier Patient Education  [employer].  Consider elevating head of bed 4 to 6 inches  Avoid eating within 2-3 hours of bedtime  Consider OTC Chlorpheniramine 4 mg at night for any postnasal drip and/ or Flonase.  Consider OTC Pepcid 20 mg once to twice.

## 2018-11-06 NOTE — Progress Notes (Signed)
  Subjective:     Patient ID: Tammy [name], female   DOB: January 26, 1951, 68 y.o.   MRN: [id number]  HPI Patient is seen with ongoing problem of frequent clearing of throat.  This has gone on for several months.  Symptoms are somewhat intermittent.  She has not had any cough.  No dysphagia.  No hoarseness.  No facial pain.  No purulent secretions.  No fevers or chills.  She is aware of some occasional GERD symptoms.  Also has occasional postnasal drainage symptoms.  Quit smoking back in 1975.  Denies any sore throat.  No appetite or weight change.  Still plays tennis fairly regularly without any difficulties.  Past Medical History:  Diagnosis Date  . Hyperlipidemia    Past Surgical History:  Procedure Laterality Date  . DILATION AND CURETTAGE OF UTERUS N/A     reports that she quit smoking about 45 years ago. Her smoking use included cigarettes. She has a 5.00 pack-year smoking history. She has never used smokeless tobacco. She reports current alcohol use. She reports that she does not use drugs. family history includes Cancer in her father, mother, and sister; Heart disease in her father; Heart disease (age of onset: 61) in her mother. Allergies  Allergen Reactions  . Penicillins Rash  . Codeine Other (See Comments)    Pt. States "crazy dreams"  . Contrast Media [Iodinated Diagnostic Agents] Itching    Eye Swelling     Review of Systems  Constitutional: Negative for appetite change, chills, fever and unexpected weight change.  HENT: Negative for sore throat and trouble swallowing.   Respiratory: Negative for cough and shortness of breath.   Cardiovascular: Negative for chest pain.  Hematological: Negative for adenopathy.       Objective:   Physical Exam Constitutional:      Appearance: Normal appearance.  Neck:     Musculoskeletal: Neck supple.     Comments: No thyromegaly Cardiovascular:     Rate and Rhythm: Normal rate and regular rhythm.  Pulmonary:     Effort: Pulmonary  effort is normal.     Breath sounds: Normal breath sounds.  Lymphadenopathy:     Cervical: No cervical adenopathy.  Neurological:     Mental Status: She is alert.        Assessment:     Patient presents with several month history of frequent clearing of throat.  Differential is postnasal drip versus GERD related    Plan:     -Avoid eating within 2 to 3 hours of bedtime -Consider elevate head of bed 4 to 6 inches -Recommend trial of Pepcid 20 mg once or twice daily -Recommend consider over-the-counter chlorpheniramine 4 mg nightly and/or Flonase daily  Eulas Post MD [facility]

## 2018-11-13 ENCOUNTER — Other Ambulatory Visit: Payer: Self-pay

## 2018-11-13 ENCOUNTER — Ambulatory Visit: Payer: PPO | Admitting: Orthotics

## 2018-11-13 DIAGNOSIS — M722 Plantar fascial fibromatosis: Secondary | ICD-10-CM

## 2018-11-13 NOTE — Progress Notes (Signed)
Patient came in today to pick up custom made foot orthotics.  The goals were accomplished and the patient reported no dissatisfaction with said orthotics.  Patient was advised of breakin period and how to report any issues. 

## 2018-11-29 ENCOUNTER — Other Ambulatory Visit: Payer: Self-pay | Admitting: Obstetrics and Gynecology

## 2018-11-29 ENCOUNTER — Telehealth: Payer: Self-pay | Admitting: Internal Medicine

## 2018-11-29 DIAGNOSIS — Z1231 Encounter for screening mammogram for malignant neoplasm of breast: Secondary | ICD-10-CM

## 2018-11-29 NOTE — Telephone Encounter (Signed)
Received call from [employer]. [employer].   Feeling like her heart has been "flipping around" worse than usual. This has been ongoing to some degree since her 44s, but yesterday she was noticing it more intermittently. Today it has been progressively worse, still coming and going. Highest HR palpated during these episodes has been 79, but it does feel like she has irregular beats sometimes. She has a history of a single AF episode 3 years ago without recurrence as well as PACs. Has metoprolol 25mg  prn but has never taken it. Instructed to take metoprolol dose tonight. If palpitations persist tomorrow, instructed to call office to obtain ECG to document rhythm to guide therapy (eg flecainide, etc).

## 2018-11-30 ENCOUNTER — Telehealth: Payer: Self-pay | Admitting: Cardiology

## 2018-11-30 ENCOUNTER — Ambulatory Visit: Payer: PPO

## 2018-11-30 NOTE — Telephone Encounter (Signed)
Spoke with patient who is agreeable to being evaluated in the [facility] and I was able to get her scheduled for tomorrow.  Pt given directions of where the clinic is, the phone number in case she gets lost and the code to the parking gate.  She is hesitant to start taking Metoprolol daily at this point though she does still feel palpitations.  If they seem to get more frequent this evening or interrupt her sleep she will take one tablet.  All questions answered and pt thanked me for helping her be set up with this appt.  Assured [name] are excellent and will take wonderful care of her.

## 2018-11-30 NOTE — Telephone Encounter (Signed)
Lets have her come into the atrial fibrillation clinic tomorrow if possible.  I only have a virtual a.m. clinic tomorrow.  Agree with taking the metoprolol daily. Candee Furbish, MD

## 2018-11-30 NOTE — Telephone Encounter (Signed)
°  STAT if HR is under 50 or over 120 (normal HR is 60-100 beats per minute)  1) What is your heart rate? Flip flopping //  PAC's  2) Do you have a log of your heart rate readings (document readings)? no  3) Do you have any other symptoms? Not sleeping.

## 2018-11-30 NOTE — Telephone Encounter (Signed)
Pt spoke with Dr Kalman Shan last night on call about feeling like her heart was flip/flopping.  She was advised to take Metoprolol tartrate 25 mg and to call the office today if no improvement.  Pt reports she didn't take the Metoprolol until about 1 am.  She went to sleep but then woke above 4 am with the same irregular feeling.  At times it almost takes her breathe away.  She reports it being some better today but still occurring.  She reports a previous history of PACs since her 80's and a previous Hx of At Fib.  Advised I will sent this information to Dr Marlou Porch for further review and orders. To see if she needs an EKG VS Monitor and if she should start beta blocker daily instead of prn.  She is taking Xarelto 20 mg daily.

## 2018-11-30 NOTE — Telephone Encounter (Signed)
Thanks for the update Agree with plan Roschelle Calandra, MD  

## 2018-12-01 ENCOUNTER — Other Ambulatory Visit: Payer: Self-pay

## 2018-12-01 ENCOUNTER — Ambulatory Visit (HOSPITAL_COMMUNITY)
Admission: RE | Admit: 2018-12-01 | Discharge: 2018-12-01 | Disposition: A | Discharge status: Home / Self Care | Payer: PPO | Source: Ambulatory Visit | Attending: Nurse Practitioner | Admitting: Nurse Practitioner

## 2018-12-01 ENCOUNTER — Encounter (HOSPITAL_COMMUNITY): Payer: Self-pay | Admitting: Nurse Practitioner

## 2018-12-01 VITALS — BP 108/76 | HR 82 | Ht 65.0 in | Wt 113.0 lb

## 2018-12-01 DIAGNOSIS — Z87891 Personal history of nicotine dependence: Secondary | ICD-10-CM | POA: Diagnosis not present

## 2018-12-01 DIAGNOSIS — Z91041 Radiographic dye allergy status: Secondary | ICD-10-CM | POA: Insufficient documentation

## 2018-12-01 DIAGNOSIS — Z880 Allergy status to penicillin: Secondary | ICD-10-CM | POA: Insufficient documentation

## 2018-12-01 DIAGNOSIS — Z800 Family history of malignant neoplasm of digestive organs: Secondary | ICD-10-CM | POA: Insufficient documentation

## 2018-12-01 DIAGNOSIS — R9431 Abnormal electrocardiogram [ECG] [EKG]: Secondary | ICD-10-CM | POA: Insufficient documentation

## 2018-12-01 DIAGNOSIS — Z8249 Family history of ischemic heart disease and other diseases of the circulatory system: Secondary | ICD-10-CM | POA: Insufficient documentation

## 2018-12-01 DIAGNOSIS — I480 Paroxysmal atrial fibrillation: Secondary | ICD-10-CM

## 2018-12-01 DIAGNOSIS — Z7901 Long term (current) use of anticoagulants: Secondary | ICD-10-CM | POA: Diagnosis not present

## 2018-12-01 DIAGNOSIS — Z885 Allergy status to narcotic agent status: Secondary | ICD-10-CM | POA: Insufficient documentation

## 2018-12-01 DIAGNOSIS — I499 Cardiac arrhythmia, unspecified: Secondary | ICD-10-CM | POA: Insufficient documentation

## 2018-12-01 MED ORDER — DILTIAZEM HCL 30 MG PO TABS: ORAL_TABLET | ORAL | 1 refills | Status: DC

## 2018-12-01 NOTE — Progress Notes (Signed)
Primary Care Physician: Tammy Post, MD Referring Physician: [name] is a 68 y.o. female with a h/o remote afib and palpitations in the afib clinic for increase of irregularity of heart rhythm over the last few days.EKG today show NSR (with artifact). She does not drink caffeine, nightly glass of wine. Denies sleep apnea. Is not overweight and exercises regularly.  Today, she denies symptoms of palpitations, chest pain, shortness of breath, orthopnea, PND, lower extremity edema, dizziness, presyncope, syncope, or neurologic sequela. The patient is tolerating medications without difficulties and is otherwise without complaint today.   Past Medical History:  Diagnosis Date  . Hyperlipidemia    Past Surgical History:  Procedure Laterality Date  . DILATION AND CURETTAGE OF UTERUS N/A     Current Outpatient Medications  Medication Sig Dispense Refill  . metoprolol tartrate (LOPRESSOR) 25 MG tablet Take 1 tablet (25 mg total) by mouth as needed. 30 tablet 11  . XARELTO 20 MG TABS tablet TAKE 1 TABLET(20 MG) BY MOUTH DAILY WITH SUPPER 30 tablet 5  . diltiazem (CARDIZEM) 30 MG tablet Cardizem 30mg  -- Take 1 tablet every 4 hours AS NEEDED for heart rate >100 as long as top blood pressure >100. 45 tablet 1   No current facility-administered medications for this encounter.     Allergies  Allergen Reactions  . Penicillins Rash  . Codeine Other (See Comments)    Pt. States "crazy dreams"  . Contrast Media [Iodinated Diagnostic Agents] Itching    Eye Swelling    Social History   Socioeconomic History  . Marital status: Married    Spouse name: Not on file  . Number of children: Not on file  . Years of education: Not on file  . Highest education level: Not on file  Occupational History  . Not on file  Social Needs  . Financial resource strain: Not on file  . Food insecurity    Worry: Not on file    Inability: Not on file  . Transportation needs   Medical: Not on file    Non-medical: Not on file  Tobacco Use  . Smoking status: Former Smoker    Packs/day: 1.00    Years: 5.00    Pack years: 5.00    Types: Cigarettes    Quit date: 05/26/1973    Years since quitting: 45.5  . Smokeless tobacco: Never Used  Substance and Sexual Activity  . Alcohol use: Yes    Alcohol/week: 1.0 standard drinks    Types: 1 Glasses of wine per week    Comment: 4 x's a wk a glass of wine  . Drug use: Never  . Sexual activity: Not on file  Lifestyle  . Physical activity    Days per week: Not on file    Minutes per session: Not on file  . Stress: Not on file  Relationships  . Social Herbalist on phone: Not on file    Gets together: Not on file    Attends religious service: Not on file    Active member of club or organization: Not on file    Attends meetings of clubs or organizations: Not on file    Relationship status: Not on file  . Intimate partner violence    Fear of current or ex partner: Not on file    Emotionally abused: Not on file    Physically abused: Not on file    Forced sexual activity: Not on file  Other Topics Concern  . Not on file  Social History Narrative  . Not on file    Family History  Problem Relation Age of Onset  . Heart disease Mother 7       CABG  . Cancer Mother        bladder cancer  . Heart disease Father        MVP, atrial fibrilation  . Cancer Father        colon cancer  . Cancer Sister        colon cancer    ROS- All systems are reviewed and negative except as per the HPI above  Physical Exam: Vitals:   12/01/18 0934  BP: 108/76  Pulse: 82  Weight: 51.3 kg  Height: 5\' 5"  (1.651 m)   Wt Readings from Last 3 Encounters:  12/01/18 51.3 kg  11/06/18 50.9 kg  03/15/18 53.2 kg    Labs: Lab Results  Component Value Date   NA 138 01/11/2018   K 4.6 01/11/2018   CL 104 01/11/2018   CO2 25 01/11/2018   GLUCOSE 89 01/11/2018   BUN 15 01/11/2018   CREATININE 0.76 01/11/2018    CALCIUM 9.2 01/11/2018   No results found for: INR Lab Results  Component Value Date   CHOL 184 01/11/2018   HDL 51.30 01/11/2018   LDLCALC 118 (H) 01/11/2018   TRIG 71.0 01/11/2018     GEN- The patient is well appearing, alert and oriented x 3 today.   Head- normocephalic, atraumatic Eyes-  Sclera clear, conjunctiva pink Ears- hearing intact Oropharynx- clear Neck- supple, no JVP Lymph- no cervical lymphadenopathy Lungs- Clear to ausculation bilaterally, normal work of breathing Heart- Regular rate and rhythm, no murmurs, rubs or gallops, PMI not laterally displaced GI- soft, NT, ND, + BS Extremities- no clubbing, cyanosis, or edema MS- no significant deformity or atrophy Skin- no rash or lesion Psych- euthymic mood, full affect Neuro- strength and sensation are intact  EKG- NSR at 82 bpm(baseline artifact present)   Assessment and Plan: 1. Irregular heart beat Pt unsure of palpitations vrs afib Has prn metoprolol but has not used Will also give Rx for prn cardizem 30 mg as an other option  Triggers reviewed and no specific trigger identified 1 week zio patch placed   Pt will be notified of result of monitor and f/u based on those results   Tammy [name], [facility] [address] [place], [place] [address] [phone]

## 2018-12-01 NOTE — Patient Instructions (Signed)
Cardizem 30mg  -- take 1 tablet every 4 hours AS NEEDED for heart rate >100 as long as top  Number of blood pressure >100.

## 2018-12-11 ENCOUNTER — Encounter: Payer: Self-pay | Admitting: Podiatry

## 2018-12-11 ENCOUNTER — Ambulatory Visit (INDEPENDENT_AMBULATORY_CARE_PROVIDER_SITE_OTHER): Payer: PPO | Admitting: Podiatry

## 2018-12-11 ENCOUNTER — Other Ambulatory Visit: Payer: Self-pay

## 2018-12-11 DIAGNOSIS — M2011 Hallux valgus (acquired), right foot: Secondary | ICD-10-CM

## 2018-12-11 DIAGNOSIS — M2012 Hallux valgus (acquired), left foot: Secondary | ICD-10-CM

## 2018-12-11 DIAGNOSIS — M722 Plantar fascial fibromatosis: Secondary | ICD-10-CM | POA: Diagnosis not present

## 2018-12-11 NOTE — Progress Notes (Signed)
Subjective:   Patient ID: Tammy [name], female   DOB: 68 y.o.   MRN: [id number]   HPI Patient states she is doing very well with the right foot and states at this point she has no pain and has been wearing her night splint to bed   ROS      Objective:  Physical Exam  Neurovascular status intact with patient's right heel feeling much better with pain that is receded by about 80%     Assessment:  Doing better with chronic plantar fasciitis right     Plan:  Reviewed condition at great length and discussed different treatment options and reduction of boot usage at night and just using it during the day for stretch.  Patient will be seen back for [place] to recheck again on an as-needed basis and also discussed bunion which she like to get done after the first the year when her grand baby is born.  Reappoint to discuss after that occurs

## 2018-12-19 DIAGNOSIS — I480 Paroxysmal atrial fibrillation: Secondary | ICD-10-CM | POA: Diagnosis not present

## 2019-01-04 ENCOUNTER — Ambulatory Visit: Payer: PPO

## 2019-01-08 ENCOUNTER — Other Ambulatory Visit: Payer: Self-pay

## 2019-01-08 ENCOUNTER — Ambulatory Visit
Admission: RE | Admit: 2019-01-08 | Discharge: 2019-01-08 | Disposition: A | Discharge status: Home / Self Care | Payer: PPO | Source: Ambulatory Visit | Attending: Obstetrics and Gynecology | Admitting: Obstetrics and Gynecology

## 2019-01-08 DIAGNOSIS — Z1231 Encounter for screening mammogram for malignant neoplasm of breast: Secondary | ICD-10-CM

## 2019-01-09 ENCOUNTER — Ambulatory Visit: Payer: PPO

## 2019-01-12 ENCOUNTER — Telehealth (HOSPITAL_COMMUNITY): Payer: Self-pay | Admitting: *Deleted

## 2019-01-12 NOTE — Telephone Encounter (Signed)
Left message with details

## 2019-01-12 NOTE — Telephone Encounter (Signed)
-----   Message from Sherran Needs, NP sent at 01/12/2019  2:06 PM EST ----- Pt was in SR while wearing monitor , no afib noted, few early beats and very short  runs of HR over 100 ( matter of 6-12 beats)

## 2019-01-17 ENCOUNTER — Other Ambulatory Visit: Payer: Self-pay

## 2019-01-23 ENCOUNTER — Encounter: Payer: PPO | Admitting: Family Medicine

## 2019-01-25 ENCOUNTER — Other Ambulatory Visit: Payer: Self-pay

## 2019-01-26 ENCOUNTER — Ambulatory Visit (INDEPENDENT_AMBULATORY_CARE_PROVIDER_SITE_OTHER): Payer: PPO | Admitting: Family Medicine

## 2019-01-26 ENCOUNTER — Other Ambulatory Visit: Payer: Self-pay

## 2019-01-26 ENCOUNTER — Encounter: Payer: Self-pay | Admitting: Family Medicine

## 2019-01-26 VITALS — BP 130/78 | HR 73 | Temp 97.8°F | Ht 65.1 in | Wt 111.4 lb

## 2019-01-26 DIAGNOSIS — M810 Age-related osteoporosis without current pathological fracture: Secondary | ICD-10-CM

## 2019-01-26 DIAGNOSIS — Z23 Encounter for immunization: Secondary | ICD-10-CM | POA: Diagnosis not present

## 2019-01-26 DIAGNOSIS — Z0000 Encounter for general adult medical examination without abnormal findings: Secondary | ICD-10-CM

## 2019-01-26 LAB — CBC WITH DIFFERENTIAL/PLATELET
Basophils Absolute: 0 10*3/uL (ref 0.0–0.1)
Basophils Relative: 0.8 % (ref 0.0–3.0)
Eosinophils Absolute: 0 10*3/uL (ref 0.0–0.7)
Eosinophils Relative: 0.7 % (ref 0.0–5.0)
HCT: 44.9 % (ref 36.0–46.0)
Hemoglobin: 15 g/dL (ref 12.0–15.0)
Lymphocytes Relative: 23.3 % (ref 12.0–46.0)
Lymphs Abs: 1.3 10*3/uL (ref 0.7–4.0)
MCHC: 33.3 g/dL (ref 30.0–36.0)
MCV: 97.7 fl (ref 78.0–100.0)
Monocytes Absolute: 0.4 10*3/uL (ref 0.1–1.0)
Monocytes Relative: 7.5 % (ref 3.0–12.0)
Neutro Abs: 3.9 10*3/uL (ref 1.4–7.7)
Neutrophils Relative %: 67.7 % (ref 43.0–77.0)
Platelets: 282 10*3/uL (ref 150.0–400.0)
RBC: 4.6 Mil/uL (ref 3.87–5.11)
RDW: 12.6 % (ref 11.5–15.5)
WBC: 5.8 10*3/uL (ref 4.0–10.5)

## 2019-01-26 LAB — BASIC METABOLIC PANEL
BUN: 18 mg/dL (ref 6–23)
CO2: 27 mEq/L (ref 19–32)
Calcium: 9.7 mg/dL (ref 8.4–10.5)
Chloride: 100 mEq/L (ref 96–112)
Creatinine, Ser: 0.82 mg/dL (ref 0.40–1.20)
GFR: 69.12 mL/min (ref >60.00)
Glucose, Bld: 94 mg/dL (ref 70–99)
Potassium: 4.6 mEq/L (ref 3.5–5.1)
Sodium: 138 mEq/L (ref 135–145)

## 2019-01-26 LAB — LIPID PANEL
Cholesterol: 238 mg/dL — ABNORMAL HIGH (ref 0–200)
HDL: 64.2 mg/dL (ref >39.00)
LDL Cholesterol: 161 mg/dL — ABNORMAL HIGH (ref 0–99)
NonHDL: 174.28
Total CHOL/HDL Ratio: 4
Triglycerides: 68 mg/dL (ref 0.0–149.0)
VLDL: 13.6 mg/dL (ref 0.0–40.0)

## 2019-01-26 LAB — HEPATIC FUNCTION PANEL
ALT: 16 U/L (ref 0–35)
AST: 16 U/L (ref 0–37)
Albumin: 4.5 g/dL (ref 3.5–5.2)
Alkaline Phosphatase: 65 U/L (ref 39–117)
Bilirubin, Direct: 0.1 mg/dL (ref 0.0–0.3)
Total Bilirubin: 0.6 mg/dL (ref 0.2–1.2)
Total Protein: 7.1 g/dL (ref 6.0–8.3)

## 2019-01-26 LAB — TSH: TSH: 1.95 u[IU]/mL (ref 0.35–4.50)

## 2019-01-26 LAB — VITAMIN D 25 HYDROXY (VIT D DEFICIENCY, FRACTURES): VITD: 37.94 ng/mL (ref 30.00–100.00)

## 2019-01-26 NOTE — Patient Instructions (Signed)
Preventive Care 69 Years and Older, Female Preventive care refers to lifestyle choices and visits with your health care provider that can promote health and wellness. This includes:  A yearly physical exam. This is also called an annual well check.  Regular dental and eye exams.  Immunizations.  Screening for certain conditions.  Healthy lifestyle choices, such as diet and exercise. What can I expect for my preventive care visit? Physical exam Your health care provider will check:  Height and weight. These may be used to calculate body mass index (BMI), which is a measurement that tells if you are at a healthy weight.  Heart rate and blood pressure.  Your skin for abnormal spots. Counseling Your health care provider may ask you questions about:  Alcohol, tobacco, and drug use.  Emotional well-being.  Home and relationship well-being.  Sexual activity.  Eating habits.  History of falls.  Memory and ability to understand (cognition).  Work and work Statistician.  Pregnancy and menstrual history. What immunizations do I need?  Influenza (flu) vaccine  This is recommended every year. Tetanus, diphtheria, and pertussis (Tdap) vaccine  You may need a Td booster every 10 years. Varicella (chickenpox) vaccine  You may need this vaccine if you have not already been vaccinated. Zoster (shingles) vaccine  You may need this after age 69. Pneumococcal conjugate (PCV13) vaccine  One dose is recommended after age 69. Pneumococcal polysaccharide (PPSV23) vaccine  One dose is recommended after age 72. Measles, mumps, and rubella (MMR) vaccine  You may need at least one dose of MMR if you were born in 1957 or later. You may also need a second dose. Meningococcal conjugate (MenACWY) vaccine  You may need this if you have certain conditions. Hepatitis A vaccine  You may need this if you have certain conditions or if you travel or work in places where you may be exposed  to hepatitis A. Hepatitis B vaccine  You may need this if you have certain conditions or if you travel or work in places where you may be exposed to hepatitis B. Haemophilus influenzae type b (Hib) vaccine  You may need this if you have certain conditions. You may receive vaccines as individual doses or as more than one vaccine together in one shot (combination vaccines). Talk with your health care provider about the risks and benefits of combination vaccines. What tests do I need? Blood tests  Lipid and cholesterol levels. These may be checked every 5 years, or more frequently depending on your overall health.  Hepatitis C test.  Hepatitis B test. Screening  Lung cancer screening. You may have this screening every year starting at age 69 if you have a 30-pack-year history of smoking and currently smoke or have quit within the past 15 years.  Colorectal cancer screening. All adults should have this screening starting at age 69 and continuing until age 15. Your health care provider may recommend screening at age 69 if you are at increased risk. You will have tests every 1-10 years, depending on your results and the type of screening test.  Diabetes screening. This is done by checking your blood sugar (glucose) after you have not eaten for a while (fasting). You may have this done every 1-3 years.  Mammogram. This may be done every 1-2 years. Talk with your health care provider about how often you should have regular mammograms.  BRCA-related cancer screening. This may be done if you have a family history of breast, ovarian, tubal, or peritoneal cancers.  Other tests  Sexually transmitted disease (STD) testing.  Bone density scan. This is done to screen for osteoporosis. You may have this done starting at age 69. Follow these instructions at home: Eating and drinking  Eat a diet that includes fresh fruits and vegetables, whole grains, lean protein, and low-fat dairy products. Limit  your intake of foods with high amounts of sugar, saturated fats, and salt.  Take vitamin and mineral supplements as recommended by your health care provider.  Do not drink alcohol if your health care provider tells you not to drink.  If you drink alcohol: ? Limit how much you have to 0-1 drink a day. ? Be aware of how much alcohol is in your drink. In the [place] one drink equals one 12 oz bottle of beer (355 mL), one 5 oz glass of wine (148 mL), or one 1 oz glass of hard liquor (44 mL). Lifestyle  Take daily care of your teeth and gums.  Stay active. Exercise for at least 30 minutes on 5 or more days each week.  Do not use any products that contain nicotine or tobacco, such as cigarettes, e-cigarettes, and chewing tobacco. If you need help quitting, ask your health care provider.  If you are sexually active, practice safe sex. Use a condom or other form of protection in order to prevent STIs (sexually transmitted infections).  Talk with your health care provider about taking a low-dose aspirin or statin. What's next?  Go to your health care provider once a year for a well check visit.  Ask your health care provider how often you should have your eyes and teeth checked.  Stay up to date on all vaccines. This information is not intended to replace advice given to you by your health care provider. Make sure you discuss any questions you have with your health care provider. Document Released: 03/07/2015 Document Revised: 02/02/2018 Document Reviewed: 02/02/2018 Elsevier Patient Education  Tammy [name].  Consider Shingrix vaccine and check in insurance coverage if interested.

## 2019-01-26 NOTE — Progress Notes (Signed)
Subjective:     Patient ID: Tammy [name], female   DOB: 06-04-1950, 68 y.o.   MRN: [id number]  HPI [name] is here for physical examination.  She sees gynecologist yearly.  She had DEXA scan reportedly recently which showed progression of osteoporosis.  She is taking over-the-counter supplement with algae Cal.  She stays very active physically and plays tennis regularly.  No recent falls.  She has history of intermittent atrial fibrillation and takes Xarelto.  She also has occasional symptomatic PACs.  She had recent event monitor per cardiology.  This was essentially unremarkable.  She has diltiazem to take as needed for any kind of tachyarrhythmias.  Still needs flu vaccine.  Also no history of shingles vaccine.  Other immunizations up-to-date.  Health Maintenance  Topic Date Due  . INFLUENZA VACCINE  09/23/2018  . MAMMOGRAM  01/07/2021  . TETANUS/TDAP  04/30/2024  . COLONOSCOPY  06/18/2026  . DEXA SCAN  Completed  . Hepatitis C Screening  Completed  . PNA vac Low Risk Adult  Completed   Past Medical History:  Diagnosis Date  . Hyperlipidemia    Past Surgical History:  Procedure Laterality Date  . DILATION AND CURETTAGE OF UTERUS N/A     reports that she quit smoking about 45 years ago. Her smoking use included cigarettes. She has a 5.00 pack-year smoking history. She has never used smokeless tobacco. She reports current alcohol use of about 1.0 standard drinks of alcohol per week. She reports that she does not use drugs. family history includes Cancer in her father, mother, and sister; Heart disease in her father; Heart disease (age of onset: 70) in her mother. Allergies  Allergen Reactions  . Penicillins Rash  . Codeine Other (See Comments)    Pt. States "crazy dreams"  . Contrast Media [Iodinated Diagnostic Agents] Itching    Eye Swelling     Review of Systems  Constitutional: Negative for activity change, appetite change, fatigue, fever and unexpected weight  change.  HENT: Negative for ear pain, hearing loss, sore throat and trouble swallowing.   Eyes: Negative for visual disturbance.  Respiratory: Negative for cough and shortness of breath.   Cardiovascular: Positive for palpitations. Negative for chest pain.  Gastrointestinal: Negative for abdominal pain, blood in stool, constipation and diarrhea.  Genitourinary: Negative for dysuria and hematuria.  Musculoskeletal: Negative for arthralgias, back pain and myalgias.  Skin: Negative for rash.  Neurological: Negative for dizziness, syncope and headaches.  Hematological: Negative for adenopathy.  Psychiatric/Behavioral: Negative for confusion and dysphoric mood.       Objective:   Physical Exam Constitutional:      Appearance: She is well-developed.  HENT:     Head: Normocephalic and atraumatic.  Eyes:     Pupils: Pupils are equal, round, and reactive to light.  Neck:     Musculoskeletal: Normal range of motion and neck supple.     Thyroid: No thyromegaly.  Cardiovascular:     Rate and Rhythm: Normal rate and regular rhythm.     Heart sounds: Normal heart sounds. No murmur.  Pulmonary:     Effort: No respiratory distress.     Breath sounds: Normal breath sounds. No wheezing or rales.  Abdominal:     General: Bowel sounds are normal. There is no distension.     Palpations: Abdomen is soft. There is no mass.     Tenderness: There is no abdominal tenderness. There is no guarding or rebound.  Musculoskeletal: Normal range of motion.  Right lower leg: No edema.     Left lower leg: No edema.  Lymphadenopathy:     Cervical: No cervical adenopathy.  Skin:    Findings: No rash.  Neurological:     Mental Status: She is alert and oriented to person, place, and time.     Cranial Nerves: No cranial nerve deficit.     Deep Tendon Reflexes: Reflexes normal.  Psychiatric:        Behavior: Behavior normal.        Thought Content: Thought content normal.        Judgment: Judgment normal.         Assessment:     Physical exam.  Patient has osteoporosis and is on calcium and vitamin D supplements.  We discussed the following health maintenance issues    Plan:     -Flu vaccine given -Recommend she consider Shingrix.  She still needs to check on insurance coverage -Obtain screening labs.  Will include 25 hydroxy vitamin D level with her history of osteoporosis -Continue regular weightbearing exercise -She will continue with regular GYN follow-up regarding mammograms  Eulas Post MD [facility]

## 2019-01-26 NOTE — Addendum Note (Signed)
Addended by: Anibal Henderson on: 01/26/2019 09:48 AM   Modules accepted: Orders

## 2019-01-28 ENCOUNTER — Encounter: Payer: Self-pay | Admitting: Family Medicine

## 2019-03-06 ENCOUNTER — Other Ambulatory Visit: Payer: Self-pay | Admitting: Physician Assistant

## 2019-03-06 NOTE — Telephone Encounter (Signed)
Age 69, weight 50.5kg, SCr 0.82 on 01/26/19, CrCl 51.92mL/min. Last OV Oct 2020, afib indication

## 2019-03-16 ENCOUNTER — Encounter: Payer: Self-pay | Admitting: Cardiology

## 2019-03-16 ENCOUNTER — Ambulatory Visit: Payer: PPO | Admitting: Cardiology

## 2019-03-16 ENCOUNTER — Other Ambulatory Visit: Payer: Self-pay

## 2019-03-16 VITALS — BP 126/70 | HR 93 | Ht 65.1 in | Wt 114.0 lb

## 2019-03-16 DIAGNOSIS — I480 Paroxysmal atrial fibrillation: Secondary | ICD-10-CM

## 2019-03-16 DIAGNOSIS — E782 Mixed hyperlipidemia: Secondary | ICD-10-CM

## 2019-03-16 DIAGNOSIS — Z79899 Other long term (current) drug therapy: Secondary | ICD-10-CM

## 2019-03-16 DIAGNOSIS — I6521 Occlusion and stenosis of right carotid artery: Secondary | ICD-10-CM

## 2019-03-16 MED ORDER — ROSUVASTATIN CALCIUM 5 MG PO TABS: 5.0000 mg | ORAL_TABLET | Freq: Every day | ORAL | 3 refills | Status: DC

## 2019-03-16 MED ORDER — METOPROLOL TARTRATE 25 MG PO TABS: 25.0000 mg | ORAL_TABLET | ORAL | 11 refills | Status: DC | PRN

## 2019-03-16 NOTE — Progress Notes (Signed)
Cardiology Office Note:    Date:  03/16/2019   ID:  Randye, Hugley 15-May-1950, MRN [id number]  PCP:  Eulas Post, MD  Cardiologist:  Candee Furbish, MD  Electrophysiologist:  None   Referring MD: Eulas Post, MD     History of Present Illness:    Tammy [name] is a 69 y.o. female here for follow-up of atrial fibrillation previously rapid ventricular response of 141 bpm who spontaneously converted.  Episode was 3 hours in duration.  Her CHADSVASc score at that time was 2.  Placed on Xarelto.  Troponins were normal.  EF normal.  Has had PACs for many years remembers her that her father had PACs as well.  Her father had atrial fibrillation but refused to take anticoagulation and ended up having a stroke.  Mother had CABG in her 83s.  Overall has been doing quite well.  No fevers chills nausea vomiting syncope bleeding.  No real issues with Xarelto.  Occasional nicking during shaving.  Very rare palpitations, PACs.  1 of her sisters had carotid stenosis bilaterally and stroke.  03/16/2019-here for the follow-up of atrial fibrillation paroxysmal, hyperlipidemia discussion.  See below.  Past Medical History:  Diagnosis Date  . Hyperlipidemia     Past Surgical History:  Procedure Laterality Date  . DILATION AND CURETTAGE OF UTERUS N/A     Current Medications: Current Meds  Medication Sig  . metoprolol tartrate (LOPRESSOR) 25 MG tablet Take 1 tablet (25 mg total) by mouth as needed.  Alveda Reasons 20 MG TABS tablet TAKE 1 TABLET(20 MG) BY MOUTH DAILY WITH SUPPER  . [DISCONTINUED] diltiazem (CARDIZEM) 30 MG tablet Cardizem 30mg  -- Take 1 tablet every 4 hours AS NEEDED for heart rate >100 as long as top blood pressure >100.  . [DISCONTINUED] metoprolol tartrate (LOPRESSOR) 25 MG tablet Take 1 tablet (25 mg total) by mouth as needed.     Allergies:   Penicillins, Codeine, and Contrast media [iodinated diagnostic agents]   Social History   Socioeconomic  History  . Marital status: Married    Spouse name: Not on file  . Number of children: Not on file  . Years of education: Not on file  . Highest education level: Not on file  Occupational History  . Not on file  Tobacco Use  . Smoking status: Former Smoker    Packs/day: 1.00    Years: 5.00    Pack years: 5.00    Types: Cigarettes    Quit date: 05/26/1973    Years since quitting: 45.8  . Smokeless tobacco: Never Used  Substance and Sexual Activity  . Alcohol use: Yes    Alcohol/week: 1.0 standard drinks    Types: 1 Glasses of wine per week    Comment: 4 x's a wk a glass of wine  . Drug use: Never  . Sexual activity: Not on file  Other Topics Concern  . Not on file  Social History Narrative  . Not on file   Social Determinants of Health   Financial Resource Strain:   . Difficulty of Paying Living Expenses: Not on file  Food Insecurity:   . Worried About Charity fundraiser in the Last Year: Not on file  . Ran Out of Food in the Last Year: Not on file  Transportation Needs:   . Lack of Transportation (Medical): Not on file  . Lack of Transportation (Non-Medical): Not on file  Physical Activity:   . Days of Exercise per Week:  Not on file  . Minutes of Exercise per Session: Not on file  Stress:   . Feeling of Stress : Not on file  Social Connections:   . Frequency of Communication with Friends and Family: Not on file  . Frequency of Social Gatherings with Friends and Family: Not on file  . Attends Religious Services: Not on file  . Active Member of Clubs or Organizations: Not on file  . Attends Archivist Meetings: Not on file  . Marital Status: Not on file     Family History: The patient's family history includes Cancer in her father, mother, and sister; Heart disease in her father; Heart disease (age of onset: 33) in her mother.  ROS:   Please see the history of present illness.     All other systems reviewed and are negative.  EKGs/Labs/Other Studies  Reviewed:    The following studies were reviewed today: Prior office notes EKG lab work  Echocardiogram 02/19/2016-EF 60%, trivial pericardial effusion.  Mild right carotid artery plaque on ultrasound 05/26/2017  EKG:  EKG is  ordered today.  The ekg ordered today demonstrates 03/15/2018- normal rhythm sinus arrhythmia biatrial enlargement personally reviewed and interpreted heart rate 75 bpm  Recent Labs: 01/26/2019: ALT 16; BUN 18; Creatinine, Ser 0.82; Hemoglobin 15.0; Platelets 282.0; Potassium 4.6; Sodium 138; TSH 1.95  Recent Lipid Panel    Component Value Date/Time   CHOL 238 (H) 01/26/2019 0947   TRIG 68.0 01/26/2019 0947   HDL 64.20 01/26/2019 0947   CHOLHDL 4 01/26/2019 0947   VLDL 13.6 01/26/2019 0947   LDLCALC 161 (H) 01/26/2019 0947   LDLDIRECT 142.6 03/04/2010 1001    Physical Exam:    VS:  BP 126/70   Pulse 93   Ht 5' 5.1" (1.654 m)   Wt 114 lb (51.7 kg)   LMP  (LMP Unknown)   SpO2 98%   BMI 18.91 kg/m     Wt Readings from Last 3 Encounters:  03/16/19 114 lb (51.7 kg)  01/26/19 111 lb 6.4 oz (50.5 kg)  12/01/18 113 lb (51.3 kg)    GEN: Thin, well developed, in no acute distress  HEENT: normal  Neck: no JVD, carotid bruits, or masses Cardiac: RRR; no murmurs, rubs, or gallops,no edema  Respiratory:  clear to auscultation bilaterally, normal work of breathing GI: soft, nontender, nondistended, + BS MS: no deformity or atrophy  Skin: warm and dry, no rash Neuro:  Alert and Oriented x 3, Strength and sensation are intact Psych: euthymic mood, full affect   ASSESSMENT:    1. Mixed hyperlipidemia   2. Paroxysmal atrial fibrillation (HCC)   3. Carotid artery plaque, right   4. Medication management    PLAN:    In order of problems listed above:  Paroxysmal atrial fibrillation/flutter - 3-hour prior episode, perhaps a glass of wine triggered it.  Echocardiogram reassuring.  Decrease caffeine.  Has family history of A. fib.  Father stroke without  anticoagulation.  Takes metoprolol on as-needed basis.  Refilling metoprolol. Only taken it once. AFIB clinic, monitor OK. 10/20: Sinus rhythm Rare premature atrial contractions and premature ventricular contractions Nonsustained SVT, likely atrial tachycardia No sustained arrhythmias No afib  -She may have had another episode of A. fib symptomatically.  - Stop diltiazem since she has not been using -Continue with metoprolol as needed.  If necessary, she could take this every 4 hours since it is a low dose.  Hyperlipidemia LDL from 127 to 161 on last check  by her PCP, not previously interested in statin therapy.  We discussed again today and given her mild carotid plaque, I think she would benefit from Crestor for instance.  Continuing with diet.  We discussed the rationale for statin therapy including stroke and heart attack risk reduction. Willing to try Crestor 5 mg once a day.  We will repeat LDL/lipid panel and ALT in 3 months.  Watch for any side effects.  Chronic anticoagulation, on Xarelto.  Doing well, no bleeding episodes, although did have some minor bruising left thigh.  CHADSVASc technically 3 if we include age, female, carotid artery plaque.  From PCP 15, creatinine 0.8  Carotid artery plaque -Mild right with no significant stenosis  1 year follow-up  Medication Adjustments/Labs and Tests Ordered: Current medicines are reviewed at length with the patient today.  Concerns regarding medicines are outlined above.  Orders Placed This Encounter  Procedures  . ALT  . Lipid panel   Meds ordered this encounter  Medications  . metoprolol tartrate (LOPRESSOR) 25 MG tablet    Sig: Take 1 tablet (25 mg total) by mouth as needed.    Dispense:  30 tablet    Refill:  11  . rosuvastatin (CRESTOR) 5 MG tablet    Sig: Take 1 tablet (5 mg total) by mouth daily.    Dispense:  90 tablet    Refill:  3    Patient Instructions  Medication Instructions:  Please start Crestor 5 mg a  day.  Continue all other medications as listed.  *If you need a refill on your cardiac medications before your next appointment, please call your pharmacy*  Lab:  Please return in 3 months for fasting blood work. (Lipid/ALT)  Follow-Up: At [facility], you and your health needs are our priority.  As part of our continuing mission to provide you with exceptional heart care, we have created designated Provider Care Teams.  These Care Teams include your primary Cardiologist (physician) and Advanced Practice Providers (APPs -  Physician Assistants and Nurse Practitioners) who all work together to provide you with the care you need, when you need it.  Your next appointment:   12 month(s)  The format for your next appointment:   In Person  Provider:   Candee Furbish, MD  Thank you for choosing [facility]!!        Signed, Candee Furbish, MD  03/16/2019 10:39 AM    [place]

## 2019-03-16 NOTE — Patient Instructions (Addendum)
Medication Instructions:  Please start Crestor 5 mg a day.  Continue all other medications as listed.  *If you need a refill on your cardiac medications before your next appointment, please call your pharmacy*  Lab:  Please return in 3 months for fasting blood work. (Lipid/ALT)  Follow-Up: At [facility], you and your health needs are our priority.  As part of our continuing mission to provide you with exceptional heart care, we have created designated Provider Care Teams.  These Care Teams include your primary Cardiologist (physician) and Advanced Practice Providers (APPs -  Physician Assistants and Nurse Practitioners) who all work together to provide you with the care you need, when you need it.  Your next appointment:   12 month(s)  The format for your next appointment:   In Person  Provider:   Candee Furbish, MD  Thank you for choosing [facility]!!

## 2019-04-14 ENCOUNTER — Ambulatory Visit: Payer: PPO | Attending: Internal Medicine

## 2019-04-14 DIAGNOSIS — Z23 Encounter for immunization: Secondary | ICD-10-CM | POA: Insufficient documentation

## 2019-04-14 NOTE — Progress Notes (Signed)
   Covid-19 Vaccination Clinic  Name:  Jazmari Collie    MRN: [id number] DOB: 1950/08/14  04/14/2019  Ms. Palmero was observed post Covid-19 immunization for 15 minutes without incidence. She was provided with Vaccine Information Sheet and instruction to access the V-Safe system.   Ms. Kassman was instructed to call 911 with any severe reactions post vaccine: Marland Kitchen Difficulty breathing  . Swelling of your face and throat  . A fast heartbeat  . A bad rash all over your body  . Dizziness and weakness    Immunizations Administered    Name Date Dose VIS Date Route   Pfizer COVID-19 Vaccine 04/14/2019 12:47 PM 0.3 mL 02/02/2019 Intramuscular   Manufacturer: [place]   Lot: [id number]   [place]: [id number]

## 2019-05-04 ENCOUNTER — Ambulatory Visit: Payer: PPO | Attending: Internal Medicine

## 2019-05-04 DIAGNOSIS — R52 Pain, unspecified: Secondary | ICD-10-CM | POA: Diagnosis not present

## 2019-05-04 DIAGNOSIS — R509 Fever, unspecified: Secondary | ICD-10-CM | POA: Diagnosis not present

## 2019-05-04 DIAGNOSIS — R05 Cough: Secondary | ICD-10-CM | POA: Diagnosis not present

## 2019-05-04 DIAGNOSIS — Z20822 Contact with and (suspected) exposure to covid-19: Secondary | ICD-10-CM

## 2019-05-04 DIAGNOSIS — J069 Acute upper respiratory infection, unspecified: Secondary | ICD-10-CM | POA: Diagnosis not present

## 2019-05-05 LAB — NOVEL CORONAVIRUS, NAA: SARS-CoV-2, NAA: NOT DETECTED

## 2019-05-09 ENCOUNTER — Ambulatory Visit: Payer: PPO

## 2019-05-15 ENCOUNTER — Ambulatory Visit: Payer: PPO | Attending: Internal Medicine

## 2019-05-15 DIAGNOSIS — Z23 Encounter for immunization: Secondary | ICD-10-CM

## 2019-05-15 NOTE — Progress Notes (Signed)
   Covid-19 Vaccination Clinic  Name:  Tammy [name]    MRN: [id number] DOB: [birth year]  05/15/2019  [name] was observed post Covid-19 immunization for 15 minutes without incident. She was provided with Vaccine Information Sheet and instruction to access the V-Safe system.   [name] was instructed to call 911 with any severe reactions post vaccine: Marland Kitchen Difficulty breathing  . Swelling of face and throat  . A fast heartbeat  . A bad rash all over body  . Dizziness and weakness   Immunizations Administered    Name Date Dose VIS Date Route   Pfizer COVID-19 Vaccine 05/15/2019  9:09 AM 0.3 mL 02/02/2019 Intramuscular   Manufacturer: [place]   Lot: [place]   [place]: [id number]

## 2019-06-06 ENCOUNTER — Other Ambulatory Visit: Payer: Self-pay

## 2019-06-06 ENCOUNTER — Telehealth (INDEPENDENT_AMBULATORY_CARE_PROVIDER_SITE_OTHER): Payer: PPO | Admitting: Family Medicine

## 2019-06-06 DIAGNOSIS — J0100 Acute maxillary sinusitis, unspecified: Secondary | ICD-10-CM

## 2019-06-06 MED ORDER — DOXYCYCLINE HYCLATE 100 MG PO CAPS: 100.0000 mg | ORAL_CAPSULE | Freq: Two times a day (BID) | ORAL | 0 refills | Status: DC

## 2019-06-06 NOTE — Progress Notes (Signed)
This visit type was conducted due to national recommendations for restrictions regarding the COVID-19 pandemic in an effort to limit this patient's exposure and mitigate transmission in our community.   Virtual Visit via Telephone Note  I connected with [name] on 06/06/19 at  3:45 PM EDT by telephone and verified that I am speaking with the correct person using two identifiers.   I discussed the limitations, risks, security and privacy concerns of performing an evaluation and management service by telephone and the availability of in person appointments. I also discussed with the patient that there may be a patient responsible charge related to this service. The patient expressed understanding and agreed to proceed.  Location patient: home Location provider: work or home office Participants present for the call: patient, provider Patient did not have a visit in the prior 7 days to address this/these issue(s).   History of Present Illness: Ms. Tammy [name] called with some left maxillary facial pain.  She states that 2 to 3 weeks ago she had a cold.  Her husband had similar symptoms.  She has completed her Covid vaccines.  She actually had Covid testing a few weeks ago which was negative.  Her current pain started earlier this week.  Confined to the left maxillary sinus region.  She actually gone for a dental check a few weeks ago and x-rays were normal.  He has had no fever.  Some increased malaise.  Mild headache but not consistently.  No bloody nasal discharge.  She is taken Tylenol which helps.  No cough  She has not had history of frequent sinusitis or recurrent sinusitis issues in the past  Past Medical History:  Diagnosis Date  . Hyperlipidemia    Past Surgical History:  Procedure Laterality Date  . DILATION AND CURETTAGE OF UTERUS N/A     reports that she quit smoking about 46 years ago. Her smoking use included cigarettes. She has a 5.00 pack-year smoking history. She has never used  smokeless tobacco. She reports current alcohol use of about 1.0 standard drinks of alcohol per week. She reports that she does not use drugs. family history includes Cancer in her father, mother, and sister; Heart disease in her father; Heart disease (age of onset: 56) in her mother. Allergies  Allergen Reactions  . Penicillins Rash  . Codeine Other (See Comments)    Pt. States "crazy dreams"  . Contrast Media [Iodinated Diagnostic Agents] Itching    Eye Swelling      Observations/Objective: Patient sounds cheerful and well on the phone. I do not appreciate any SOB. Speech and thought processing are grossly intact. Patient reported vitals:  Assessment and Plan: Possible left maxillary sinusitis.  No clinical suspicion for Covid.  She has not had typical Covid symptoms and has completed her Covid vaccine several weeks ago  -Given duration of symptoms will start doxycycline 100 mg twice daily for 10 days.  She is penicillin allergic. -Plenty fluids and rest. -Follow-up promptly for any fever or worsening symptoms or if symptoms not clearing over the next week  Follow Up Instructions:  -As above   99441 5-10 99442 11-20 99443 21-30 I did not refer this patient for an OV in the next 24 hours for this/these issue(s).  I discussed the assessment and treatment plan with the patient. The patient was provided an opportunity to ask questions and all were answered. The patient agreed with the plan and demonstrated an understanding of the instructions.   The patient was advised to  call back or seek an in-person evaluation if the symptoms worsen or if the condition fails to improve as anticipated.  I provided 13 minutes of non-face-to-face time during this encounter.   Carolann Littler, MD

## 2019-06-15 ENCOUNTER — Other Ambulatory Visit: Payer: PPO

## 2019-06-27 ENCOUNTER — Other Ambulatory Visit: Payer: PPO | Admitting: *Deleted

## 2019-06-27 ENCOUNTER — Other Ambulatory Visit: Payer: Self-pay

## 2019-06-27 DIAGNOSIS — Z79899 Other long term (current) drug therapy: Secondary | ICD-10-CM

## 2019-06-27 DIAGNOSIS — E782 Mixed hyperlipidemia: Secondary | ICD-10-CM | POA: Diagnosis not present

## 2019-06-27 LAB — LIPID PANEL
Chol/HDL Ratio: 3 ratio (ref 0.0–4.4)
Cholesterol, Total: 189 mg/dL (ref 100–199)
HDL: 63 mg/dL (ref >39)
LDL Chol Calc (NIH): 113 mg/dL — ABNORMAL HIGH (ref 0–99)
Triglycerides: 71 mg/dL (ref 0–149)
VLDL Cholesterol Cal: 13 mg/dL (ref 5–40)

## 2019-06-27 LAB — ALT: ALT: 17 IU/L (ref 0–32)

## 2019-06-28 ENCOUNTER — Telehealth: Payer: Self-pay | Admitting: Cardiology

## 2019-06-28 DIAGNOSIS — E782 Mixed hyperlipidemia: Secondary | ICD-10-CM

## 2019-06-28 NOTE — Telephone Encounter (Signed)
Patient returned call for lab results.  

## 2019-06-28 NOTE — Telephone Encounter (Signed)
Reviewed results with patient who verbalized understanding.   The patient reports she has not been taking Crestor. She lowered her cholesterol with diet and exercise and does not want to go on a statin.  At this time, she wishes to continue diet/exercise and would like to repeat labs in 4-5 months to see progress.  Offered to schedule blood work, but she declined. She will call to arrange when she knows her September/October schedule. She was grateful for assistance.   Med list updated.

## 2019-06-28 NOTE — Telephone Encounter (Signed)
-----   Message from Jerline Pain, MD sent at 06/28/2019 10:16 AM EDT ----- LDL 113, improved with Crestor 5 mg. If she is tolerating this well, consider increasing to 10 mg once a day.  Has mild carotid plaque. Candee Furbish, MD

## 2019-07-02 DIAGNOSIS — Z681 Body mass index (BMI) 19 or less, adult: Secondary | ICD-10-CM | POA: Diagnosis not present

## 2019-07-02 DIAGNOSIS — Z01419 Encounter for gynecological examination (general) (routine) without abnormal findings: Secondary | ICD-10-CM | POA: Diagnosis not present

## 2019-07-02 DIAGNOSIS — R319 Hematuria, unspecified: Secondary | ICD-10-CM | POA: Diagnosis not present

## 2019-07-05 ENCOUNTER — Telehealth: Payer: Self-pay | Admitting: Cardiology

## 2019-07-05 DIAGNOSIS — I480 Paroxysmal atrial fibrillation: Secondary | ICD-10-CM

## 2019-07-05 DIAGNOSIS — R42 Dizziness and giddiness: Secondary | ICD-10-CM

## 2019-07-05 NOTE — Telephone Encounter (Signed)
Pt reported that she has had a couple of episodes recently that has her concerned.  She is experiencing PACs, which is not new for her, but something has felt different with intermittent episodes.  "It almost feels like there is a pause between my beats".  It is worrying pt and causing anxiousness. She denies symptoms such as syncope or dizziness/lightheadedness.  The only issue is anxiety -- she thinks, "am I going to pass out".  She experienced episode while driving today and this cause her concern, "she could feel she was panicked".   Pt has long hx w/ PACs/PAF.  She has Metoprolol PRN that she has only had to use a couple times in last several years.  Pt informed I would forward to Dr Marlou Porch for advisement.  Advised it may be a good idea to obtain a 1-2 week monitor to determine arrhythmia so that we may better assess what is going on when she is feeling "pause b/t beats", although I did make pt aware this could be AFib related pauses, but could not say for certain. Pt aware office will call w/ recommendation once reviewed/advised by Dr. Marlou Porch. Pt agreeable to plan.  (will include forwarding to Dr. Marlou Porch covering nurse for tomorrow)

## 2019-07-05 NOTE — Telephone Encounter (Signed)
   Pt said PAC is bothering her and would like to discuss it to Dr. Marlou Porch Nurse

## 2019-07-16 ENCOUNTER — Encounter: Payer: Self-pay | Admitting: *Deleted

## 2019-07-16 NOTE — Progress Notes (Signed)
Patient ID: Tammy [name], female   DOB: Dec 16, 1950, 69 y.o.   MRN: [id number] ZIO XT monitor mailed.

## 2019-07-16 NOTE — Telephone Encounter (Signed)
Order placed for ZIO.  Will send notification and instructions to pt via MyChart.

## 2019-07-16 NOTE — Telephone Encounter (Signed)
Agree. Let's order a ZIO patch monitor for palpations.  Candee Furbish, MD

## 2019-07-17 NOTE — Telephone Encounter (Signed)
ZIO mailed to pt per documentation 5/24 by Markus Daft.

## 2019-07-18 ENCOUNTER — Ambulatory Visit (INDEPENDENT_AMBULATORY_CARE_PROVIDER_SITE_OTHER): Payer: PPO

## 2019-07-18 DIAGNOSIS — R42 Dizziness and giddiness: Secondary | ICD-10-CM | POA: Diagnosis not present

## 2019-07-18 DIAGNOSIS — I480 Paroxysmal atrial fibrillation: Secondary | ICD-10-CM

## 2019-08-20 DIAGNOSIS — R42 Dizziness and giddiness: Secondary | ICD-10-CM | POA: Diagnosis not present

## 2019-08-20 DIAGNOSIS — I480 Paroxysmal atrial fibrillation: Secondary | ICD-10-CM | POA: Diagnosis not present

## 2019-09-02 ENCOUNTER — Other Ambulatory Visit: Payer: Self-pay | Admitting: Cardiology

## 2019-09-03 NOTE — Telephone Encounter (Signed)
Prescription refill request for Xarelto received.   Last office visit: [place], 03/16/2019 Weight: 51.7 kg Age: 69 y.o. Scr: 0.82, 01/26/2019 CrCl: 52 ml/min  Prescription refill sent.

## 2019-09-18 ENCOUNTER — Other Ambulatory Visit: Payer: Self-pay

## 2019-09-24 DIAGNOSIS — M816 Localized osteoporosis [Lequesne]: Secondary | ICD-10-CM | POA: Diagnosis not present

## 2019-09-24 DIAGNOSIS — N958 Other specified menopausal and perimenopausal disorders: Secondary | ICD-10-CM | POA: Diagnosis not present

## 2019-10-17 ENCOUNTER — Telehealth: Payer: Self-pay | Admitting: Family Medicine

## 2019-10-17 NOTE — Telephone Encounter (Signed)
Pt said her 57 mth old grand daughter had RSV and she kept her. Now she is experiencing something of a cold with fever since Monday. She would like to know what she should do?  Please call pt [phone]

## 2019-10-18 ENCOUNTER — Encounter: Payer: Self-pay | Admitting: Family Medicine

## 2019-10-18 ENCOUNTER — Telehealth (INDEPENDENT_AMBULATORY_CARE_PROVIDER_SITE_OTHER): Payer: PPO | Admitting: Family Medicine

## 2019-10-18 DIAGNOSIS — R0981 Nasal congestion: Secondary | ICD-10-CM | POA: Diagnosis not present

## 2019-10-18 DIAGNOSIS — R059 Cough, unspecified: Secondary | ICD-10-CM

## 2019-10-18 DIAGNOSIS — R110 Nausea: Secondary | ICD-10-CM

## 2019-10-18 DIAGNOSIS — R05 Cough: Secondary | ICD-10-CM

## 2019-10-18 MED ORDER — ONDANSETRON 4 MG PO TBDP: 4.0000 mg | ORAL_TABLET | Freq: Three times a day (TID) | ORAL | 0 refills | Status: DC | PRN

## 2019-10-18 NOTE — Telephone Encounter (Signed)
Noted  

## 2019-10-18 NOTE — Progress Notes (Addendum)
Virtual Visit via Video Note  I connected with Tammy [name]  on 10/18/19 at  1:20 PM EDT by a video enabled telemedicine application and verified that I am speaking with the correct person using two identifiers.  Location patient: home, [place] Location provider:work or home office Persons participating in the virtual visit: patient, provider  I discussed the limitations of evaluation and management by telemedicine and the availability of in person appointments. The patient expressed understanding and agreed to proceed.   HPI:  Acute visit for -this started 10/15/19 -symptoms include nasal congestion, cough, low grade fever, upset stomach, nausea, vomited x1 - now resolved but still nauseous -kept her granddaughter last week and she had RSV, sneezed and coughed all over her -fully vaccinated for COVID19, 2nd dose in March -she wonders about getting COVID test -OTC options are working for cough and other symptoms, but requests something for nausea   ROS: See pertinent positives and negatives per HPI.  Past Medical History:  Diagnosis Date  . Hyperlipidemia     Past Surgical History:  Procedure Laterality Date  . DILATION AND CURETTAGE OF UTERUS N/A     Family History  Problem Relation Age of Onset  . Heart disease Mother 79       CABG  . Cancer Mother        bladder cancer  . Heart disease Father        MVP, atrial fibrilation  . Cancer Father        colon cancer  . Cancer Sister        colon cancer    SOCIAL HX: se ehpi   Current Outpatient Medications:  .  doxycycline (VIBRAMYCIN) 100 MG capsule, Take 1 capsule (100 mg total) by mouth 2 (two) times daily., Disp: 20 capsule, Rfl: 0 .  metoprolol tartrate (LOPRESSOR) 25 MG tablet, Take 1 tablet (25 mg total) by mouth as needed., Disp: 30 tablet, Rfl: 11 .  ondansetron (ZOFRAN ODT) 4 MG disintegrating tablet, Take 1 tablet (4 mg total) by mouth every 8 (eight) hours as needed for nausea or vomiting., Disp: 10 tablet, Rfl: 0 .   XARELTO 20 MG TABS tablet, TAKE 1 TABLET(20 MG) BY MOUTH DAILY WITH SUPPER, Disp: 30 tablet, Rfl: 5  EXAM:  VITALS per patient if applicable:  GENERAL: alert, oriented, appears well and in no acute distress  HEENT: atraumatic, conjunttiva clear, no obvious abnormalities on inspection of external nose and ears  NECK: normal movements of the head and neck  LUNGS: on inspection no signs of respiratory distress, breathing rate appears normal, no obvious gross SOB, gasping or wheezing  CV: no obvious cyanosis  MS: moves all visible extremities without noticeable abnormality  PSYCH/NEURO: pleasant and cooperative, no obvious depression or anxiety, speech and thought processing grossly intact  ASSESSMENT AND PLAN:  Discussed the following assessment and plan:  Cough  Nasal congestion  Nausea  -we discussed possible serious and likely etiologies, options for evaluation and workup, limitations of telemedicine visit vs in person visit, treatment, treatment risks and precautions. Pt prefers to treat via telemedicine empirically rather then risking or undertaking an in person visit at this moment. Given symptoms in several different systems Query viral infection, possible breakthrough COVID19 vs other. She opted to do covid test (discussed options), home isolation while sick and per cdc if positive test, Zofran rx for nausea, nasal saline, otc analgesic prn for symptoms. Did provide information for outpatient covid 19 mab treatment center in case she gets a positive  test result over the weekend. Advised to seek prompt follow up telemedicine visit or in person care if worsening, new symptoms arise, or if is not improving with treatment.    I discussed the assessment and treatment plan with the patient. The patient was provided an opportunity to ask questions and all were answered. The patient agreed with the plan and demonstrated an understanding of the instructions.   The patient was advised  to call back or seek an in-person evaluation if the symptoms worsen or if the condition fails to improve as anticipated.   Lucretia Kern, DO

## 2019-10-18 NOTE — Telephone Encounter (Signed)
Patient is scheduled for a MyChart visit with Lincoln Maxin today at 1:20.

## 2019-10-20 DIAGNOSIS — Z20828 Contact with and (suspected) exposure to other viral communicable diseases: Secondary | ICD-10-CM | POA: Diagnosis not present

## 2019-10-20 DIAGNOSIS — H1031 Unspecified acute conjunctivitis, right eye: Secondary | ICD-10-CM | POA: Diagnosis not present

## 2019-10-21 ENCOUNTER — Ambulatory Visit (HOSPITAL_COMMUNITY): Payer: Self-pay

## 2019-10-22 ENCOUNTER — Ambulatory Visit: Payer: PPO | Admitting: Cardiology

## 2019-10-23 ENCOUNTER — Encounter: Payer: Self-pay | Admitting: Family Medicine

## 2019-10-23 ENCOUNTER — Telehealth (INDEPENDENT_AMBULATORY_CARE_PROVIDER_SITE_OTHER): Payer: PPO | Admitting: Family Medicine

## 2019-10-23 VITALS — Ht 65.1 in | Wt 114.0 lb

## 2019-10-23 DIAGNOSIS — J0100 Acute maxillary sinusitis, unspecified: Secondary | ICD-10-CM

## 2019-10-23 MED ORDER — DOXYCYCLINE HYCLATE 100 MG PO CAPS
100.0000 mg | ORAL_CAPSULE | Freq: Two times a day (BID) | ORAL | 0 refills | Status: DC
Start: 2019-10-23 — End: 2020-01-28

## 2019-10-23 NOTE — Patient Instructions (Signed)
Health Maintenance Due  Topic Date Due  . INFLUENZA VACCINE  09/23/2019    Depression screen [facility] 2/9 01/26/2019 01/09/2018 02/27/2016  Decreased Interest 0 0 0  Down, Depressed, Hopeless 0 0 0  PHQ - 2 Score 0 0 0  Altered sleeping 0 - -  Tired, decreased energy 0 - -  Change in appetite 0 - -  Feeling bad or failure about yourself  0 - -  Trouble concentrating 0 - -  Moving slowly or fidgety/restless 0 - -  Suicidal thoughts 0 - -  PHQ-9 Score 0 - -  Difficult doing work/chores Not difficult at all - -

## 2019-10-23 NOTE — Progress Notes (Signed)
Patient ID: Tammy [name], female   DOB: [birth year], 69 y.o.   MRN: [id number]  This visit type was conducted due to national recommendations for restrictions regarding the COVID-19 pandemic in an effort to limit this patient's exposure and mitigate transmission in our community.   Virtual Visit via Video Note  I connected with [name] on 10/23/19 at 11:45 AM EDT by a video enabled telemedicine application and verified that I am speaking with the correct person using two identifiers.  Location patient: home Location provider:work or home office Persons participating in the virtual visit: patient, provider  I discussed the limitations of evaluation and management by telemedicine and the availability of in person appointments. The patient expressed understanding and agreed to proceed.   HPI: [name] called with recent respiratory symptoms.  She helped care for her 73-month-old granddaughter who had RSV.  Patient developed low-grade fever, congestion, cough.  Last Thursday she developed some transient vomiting and diarrhea though symptoms have fully resolved at this time.  Patient did go get Covid tested on Saturday and this came back negative.  She has been fully vaccinated.  She went to urgent care over the weekend for conjunctivitis symptoms and was prescribed Polytrim ophthalmic drops and those symptoms are clearing.  Reason for call today is right facial pain, ongoing right nasal congestion and intermittent headaches.  No fever.  No significant cough.  She is penicillin allergic.   ROS: See pertinent positives and negatives per HPI.  Past Medical History:  Diagnosis Date  . Hyperlipidemia     Past Surgical History:  Procedure Laterality Date  . DILATION AND CURETTAGE OF UTERUS N/A     Family History  Problem Relation Age of Onset  . Heart disease Mother 54       CABG  . Cancer Mother        bladder cancer  . Heart disease Father        MVP, atrial fibrilation  .  Cancer Father        colon cancer  . Cancer Sister        colon cancer    SOCIAL HX: Non-smoker   Current Outpatient Medications:  .  doxycycline (VIBRAMYCIN) 100 MG capsule, Take 1 capsule (100 mg total) by mouth 2 (two) times daily., Disp: 20 capsule, Rfl: 0 .  metoprolol tartrate (LOPRESSOR) 25 MG tablet, Take 1 tablet (25 mg total) by mouth as needed., Disp: 30 tablet, Rfl: 11 .  ondansetron (ZOFRAN ODT) 4 MG disintegrating tablet, Take 1 tablet (4 mg total) by mouth every 8 (eight) hours as needed for nausea or vomiting., Disp: 10 tablet, Rfl: 0 .  XARELTO 20 MG TABS tablet, TAKE 1 TABLET(20 MG) BY MOUTH DAILY WITH SUPPER, Disp: 30 tablet, Rfl: 5  EXAM:  VITALS per patient if applicable:  GENERAL: alert, oriented, appears well and in no acute distress  HEENT: atraumatic, conjunttiva clear, no obvious abnormalities on inspection of external nose and ears  NECK: normal movements of the head and neck  LUNGS: on inspection no signs of respiratory distress, breathing rate appears normal, no obvious gross SOB, gasping or wheezing  CV: no obvious cyanosis  MS: moves all visible extremities without noticeable abnormality  PSYCH/NEURO: pleasant and cooperative, no obvious depression or anxiety, speech and thought processing grossly intact  ASSESSMENT AND PLAN:  Discussed the following assessment and plan:  Probable acute right maxillary sinusitis in setting of probable recent viral URI.  Recent Covid test negative as above.  Patient nontoxic in appearance  -Plenty of fluids and rest -Warm compresses to face several times daily -Start doxycycline 100 mg twice daily for 10 days -Follow-up promptly for any fever or any persistent or worsening symptoms     I discussed the assessment and treatment plan with the patient. The patient was provided an opportunity to ask questions and all were answered. The patient agreed with the plan and demonstrated an understanding of the  instructions.   The patient was advised to call back or seek an in-person evaluation if the symptoms worsen or if the condition fails to improve as anticipated.     Carolann Littler, MD

## 2019-11-08 ENCOUNTER — Telehealth: Payer: Self-pay | Admitting: Family Medicine

## 2019-11-08 NOTE — Telephone Encounter (Signed)
Left message for patient to schedule Annual Wellness Visit.  Please schedule with Nurse Health Advisor Shannon Crews, RN at [facility]  

## 2019-11-26 ENCOUNTER — Telehealth (INDEPENDENT_AMBULATORY_CARE_PROVIDER_SITE_OTHER): Payer: PPO | Admitting: Family Medicine

## 2019-11-26 ENCOUNTER — Encounter: Payer: Self-pay | Admitting: Family Medicine

## 2019-11-26 ENCOUNTER — Other Ambulatory Visit: Payer: Self-pay

## 2019-11-26 VITALS — Temp 97.0°F | Ht 65.1 in | Wt 115.0 lb

## 2019-11-26 DIAGNOSIS — R059 Cough, unspecified: Secondary | ICD-10-CM

## 2019-11-26 DIAGNOSIS — R0982 Postnasal drip: Secondary | ICD-10-CM | POA: Diagnosis not present

## 2019-11-26 NOTE — Progress Notes (Signed)
Patient ID: Tammy [name], female   DOB: 1950-04-11, 69 y.o.   MRN: [id number]  This visit type was conducted due to national recommendations for restrictions regarding the COVID-19 pandemic in an effort to limit this patient's exposure and mitigate transmission in our community.   Virtual Visit via Video Note  I connected with [name] on 11/26/19 at 11:15 AM EDT by a video enabled telemedicine application and verified that I am speaking with the correct person using two identifiers.  Location patient: home Location provider:work or home office Persons participating in the virtual visit: patient, provider  I discussed the limitations of evaluation and management by telemedicine and the availability of in person appointments. The patient expressed understanding and agreed to proceed.   HPI: Tammy [name] called to discuss some ongoing respiratory symptoms.  Overall improved compared to last visit.  She had a recent exposure to granddaughter who had RSV.  She had some sinusitis symptoms involving her right maxillary sinus.  We sent in doxycycline from last visit and her sinus symptoms are improved overall.  She does have some frequent postnasal drip which she states she has several months of the year.  She thinks a lot of this may be dust allergy.  She still has some mild residual cough.  She is still playing tennis without difficulty.  No obvious wheezing.  No fever.  No dyspnea.  No headaches or facial pain.  She had questions regarding which over-the-counter medications will be safe with her history of A. fib   ROS: See pertinent positives and negatives per HPI.  Past Medical History:  Diagnosis Date  . Hyperlipidemia     Past Surgical History:  Procedure Laterality Date  . DILATION AND CURETTAGE OF UTERUS N/A     Family History  Problem Relation Age of Onset  . Heart disease Mother 52       CABG  . Cancer Mother        bladder cancer  . Heart disease Father        MVP,  atrial fibrilation  . Cancer Father        colon cancer  . Cancer Sister        colon cancer    SOCIAL HX: Non-smoker   Current Outpatient Medications:  .  metoprolol tartrate (LOPRESSOR) 25 MG tablet, Take 1 tablet (25 mg total) by mouth as needed., Disp: 30 tablet, Rfl: 11 .  sodium fluoride (FLUORISHIELD) 1.1 % GEL dental gel, PreviDent 5000 Booster Plus 1.1 % dental paste, Disp: , Rfl:  .  XARELTO 20 MG TABS tablet, TAKE 1 TABLET(20 MG) BY MOUTH DAILY WITH SUPPER, Disp: 30 tablet, Rfl: 5 .  doxycycline (VIBRAMYCIN) 100 MG capsule, Take 1 capsule (100 mg total) by mouth 2 (two) times daily. (Patient not taking: Reported on 11/26/2019), Disp: 20 capsule, Rfl: 0 .  ondansetron (ZOFRAN ODT) 4 MG disintegrating tablet, Take 1 tablet (4 mg total) by mouth every 8 (eight) hours as needed for nausea or vomiting., Disp: 10 tablet, Rfl: 0  EXAM:  VITALS per patient if applicable:  GENERAL: alert, oriented, appears well and in no acute distress  HEENT: atraumatic, conjunttiva clear, no obvious abnormalities on inspection of external nose and ears  NECK: normal movements of the head and neck  LUNGS: on inspection no signs of respiratory distress, breathing rate appears normal, no obvious gross SOB, gasping or wheezing  CV: no obvious cyanosis  MS: moves all visible extremities without noticeable abnormality  PSYCH/NEURO: pleasant  and cooperative, no obvious depression or anxiety, speech and thought processing grossly intact  ASSESSMENT AND PLAN:  Discussed the following assessment and plan:  Postnasal drip and cough symptoms.  Differential is post viral residual symptoms versus allergic.  It sounds like she probably has some seasonal allergies to probably dust mite  -We recommended trial of over-the-counter histamine such as Claritin, Allegra, or Zyrtec -Also suggested trial of Flonase nasal for postnasal drip symptoms -Can take plain Mucinex as needed -Follow-up for any fever,  dyspnea, or other concerns.  Be in touch if cough not fully resolved in 2 weeks     I discussed the assessment and treatment plan with the patient. The patient was provided an opportunity to ask questions and all were answered. The patient agreed with the plan and demonstrated an understanding of the instructions.   The patient was advised to call back or seek an in-person evaluation if the symptoms worsen or if the condition fails to improve as anticipated.     Carolann Littler, MD

## 2019-12-11 ENCOUNTER — Ambulatory Visit: Payer: PPO

## 2019-12-13 ENCOUNTER — Other Ambulatory Visit: Payer: PPO

## 2019-12-13 DIAGNOSIS — Z20822 Contact with and (suspected) exposure to covid-19: Secondary | ICD-10-CM | POA: Diagnosis not present

## 2019-12-14 LAB — NOVEL CORONAVIRUS, NAA: SARS-CoV-2, NAA: NOT DETECTED

## 2019-12-14 LAB — SARS-COV-2, NAA 2 DAY TAT

## 2019-12-18 ENCOUNTER — Ambulatory Visit: Payer: PPO

## 2019-12-31 DIAGNOSIS — Z1231 Encounter for screening mammogram for malignant neoplasm of breast: Secondary | ICD-10-CM

## 2020-01-01 ENCOUNTER — Other Ambulatory Visit: Payer: Self-pay | Admitting: Family Medicine

## 2020-01-01 DIAGNOSIS — Z1231 Encounter for screening mammogram for malignant neoplasm of breast: Secondary | ICD-10-CM

## 2020-01-09 ENCOUNTER — Ambulatory Visit (INDEPENDENT_AMBULATORY_CARE_PROVIDER_SITE_OTHER): Payer: PPO | Admitting: *Deleted

## 2020-01-09 ENCOUNTER — Other Ambulatory Visit: Payer: Self-pay

## 2020-01-09 DIAGNOSIS — Z23 Encounter for immunization: Secondary | ICD-10-CM | POA: Diagnosis not present

## 2020-01-10 ENCOUNTER — Ambulatory Visit
Admission: RE | Admit: 2020-01-10 | Discharge: 2020-01-10 | Disposition: A | Discharge status: Home / Self Care | Payer: PPO | Source: Ambulatory Visit

## 2020-01-10 DIAGNOSIS — Z1231 Encounter for screening mammogram for malignant neoplasm of breast: Secondary | ICD-10-CM | POA: Diagnosis not present

## 2020-01-28 ENCOUNTER — Encounter: Payer: Self-pay | Admitting: Family Medicine

## 2020-01-28 ENCOUNTER — Other Ambulatory Visit: Payer: Self-pay

## 2020-01-28 ENCOUNTER — Ambulatory Visit (INDEPENDENT_AMBULATORY_CARE_PROVIDER_SITE_OTHER): Payer: PPO | Admitting: Family Medicine

## 2020-01-28 VITALS — BP 123/72 | HR 91 | Temp 98.4°F | Resp 12 | Ht 65.1 in | Wt 110.5 lb

## 2020-01-28 DIAGNOSIS — Z0000 Encounter for general adult medical examination without abnormal findings: Secondary | ICD-10-CM

## 2020-01-28 NOTE — Progress Notes (Signed)
Established Patient Office Visit  Subjective:  Patient ID: Tammy [name], female    DOB: Feb 23, 1950  Age: 69 y.o. MRN: [id number]  CC:  Chief Complaint  Patient presents with  . Annual Exam    HPI Tammy [name] presents for physical exam.  She has history of intermittent atrial fibrillation and remains on Xarelto for that.  Has infrequently required metoprolol in the past.  She has history of osteoporosis, hyperlipidemia, migraine headaches.  She is still followed by GYN.  She had declined osteoporosis medications but is taking over-the-counter algae cal and states that her last bone density was actually improved after 1 year of algae cal therapy.  She does have history of hyperlipidemia.  Her LDL cholesterol is over 161 when seen by cardiology last year.  They had recommended statin therapy but patient had declined.  She did make some dietary changes and follow-up LDL cholesterol 113.  She is undecided regarding statins at this time.  She does exercise fairly regularly.  Never smoked.  Health maintenance  -Flu vaccine already given -Covid vaccines given.  She is waiting to get booster -Pneumonia vaccines complete -Previous hepatitis C screen negative -Colonoscopy due 2023 -She is getting DEXA scans through GYN -Tetanus due 2026 -Last mammogram 01/10/2020 -No history of shingles vaccine and she is undecided at this time  Family history-mother had bladder cancer as well as heart disease.  Her father had history of atrial fibrillation and history of colon cancer.  She has a sister who had colon cancer and died of stroke complications.  Social history-she is married.  Never smoked.  No regular alcohol use.  She has a couple daughters.  Past Medical History:  Diagnosis Date  . Hyperlipidemia     Past Surgical History:  Procedure Laterality Date  . DILATION AND CURETTAGE OF UTERUS N/A     Family History  Problem Relation Age of Onset  . Heart disease Mother 68        CABG  . Cancer Mother        bladder cancer  . Heart disease Father        MVP, atrial fibrilation  . Cancer Father        colon cancer  . Stroke Father   . Cancer Sister        colon cancer  . Stroke Sister     Social History   Socioeconomic History  . Marital status: Married    Spouse name: Not on file  . Number of children: Not on file  . Years of education: Not on file  . Highest education level: Not on file  Occupational History  . Not on file  Tobacco Use  . Smoking status: Former Smoker    Packs/day: 1.00    Years: 5.00    Pack years: 5.00    Types: Cigarettes    Quit date: 05/26/1973    Years since quitting: 46.7  . Smokeless tobacco: Never Used  Vaping Use  . Vaping Use: Never used  Substance and Sexual Activity  . Alcohol use: Yes    Alcohol/week: 1.0 standard drink    Types: 1 Glasses of wine per week    Comment: 4 x's a wk a glass of wine  . Drug use: Never  . Sexual activity: Not on file  Other Topics Concern  . Not on file  Social History Narrative  . Not on file   Social Determinants of Health   Financial Resource Strain:   .  Difficulty of Paying Living Expenses: Not on file  Food Insecurity:   . Worried About Charity fundraiser in the Last Year: Not on file  . Ran Out of Food in the Last Year: Not on file  Transportation Needs:   . Lack of Transportation (Medical): Not on file  . Lack of Transportation (Non-Medical): Not on file  Physical Activity:   . Days of Exercise per Week: Not on file  . Minutes of Exercise per Session: Not on file  Stress:   . Feeling of Stress : Not on file  Social Connections:   . Frequency of Communication with Friends and Family: Not on file  . Frequency of Social Gatherings with Friends and Family: Not on file  . Attends Religious Services: Not on file  . Active Member of Clubs or Organizations: Not on file  . Attends Archivist Meetings: Not on file  . Marital Status: Not on file   Intimate Partner Violence:   . Fear of Current or Ex-Partner: Not on file  . Emotionally Abused: Not on file  . Physically Abused: Not on file  . Sexually Abused: Not on file    Outpatient Medications Prior to Visit  Medication Sig Dispense Refill  . metoprolol tartrate (LOPRESSOR) 25 MG tablet Take 1 tablet (25 mg total) by mouth as needed. 30 tablet 11  . sodium fluoride (FLUORISHIELD) 1.1 % GEL dental gel PreviDent 5000 Booster Plus 1.1 % dental paste    . XARELTO 20 MG TABS tablet TAKE 1 TABLET(20 MG) BY MOUTH DAILY WITH SUPPER 30 tablet 5  . doxycycline (VIBRAMYCIN) 100 MG capsule Take 1 capsule (100 mg total) by mouth 2 (two) times daily. (Patient not taking: Reported on 11/26/2019) 20 capsule 0  . ondansetron (ZOFRAN ODT) 4 MG disintegrating tablet Take 1 tablet (4 mg total) by mouth every 8 (eight) hours as needed for nausea or vomiting. 10 tablet 0   No facility-administered medications prior to visit.    Allergies  Allergen Reactions  . Penicillins Rash  . Codeine Other (See Comments)    Pt. States "crazy dreams"  . Contrast Media [Iodinated Diagnostic Agents] Itching    Eye Swelling  . Other Itching    Eye Swelling    ROS Review of Systems  Constitutional: Negative for activity change, appetite change, fatigue, fever and unexpected weight change.  HENT: Negative for ear pain, hearing loss, sore throat and trouble swallowing.   Eyes: Negative for visual disturbance.  Respiratory: Negative for cough and shortness of breath.   Cardiovascular: Negative for chest pain and palpitations.  Gastrointestinal: Negative for abdominal pain, blood in stool, constipation and diarrhea.  Endocrine: Negative for polydipsia and polyuria.  Genitourinary: Negative for dysuria and hematuria.  Musculoskeletal: Negative for arthralgias, back pain and myalgias.  Skin: Negative for rash.  Neurological: Negative for dizziness, syncope and headaches.  Hematological: Negative for adenopathy.   Psychiatric/Behavioral: Negative for confusion and dysphoric mood.      Objective:    Physical Exam Vitals reviewed.  Constitutional:      Appearance: She is well-developed.  HENT:     Head: Normocephalic and atraumatic.  Eyes:     Pupils: Pupils are equal, round, and reactive to light.  Neck:     Thyroid: No thyromegaly.  Cardiovascular:     Rate and Rhythm: Normal rate and regular rhythm.     Heart sounds: Normal heart sounds. No murmur heard.   Pulmonary:     Effort: No respiratory  distress.     Breath sounds: Normal breath sounds. No wheezing or rales.  Abdominal:     General: Bowel sounds are normal. There is no distension.     Palpations: Abdomen is soft. There is no mass.     Tenderness: There is no abdominal tenderness. There is no guarding or rebound.  Musculoskeletal:        General: Normal range of motion.     Cervical back: Normal range of motion and neck supple.  Lymphadenopathy:     Cervical: No cervical adenopathy.  Skin:    Findings: No rash.  Neurological:     Mental Status: She is alert and oriented to person, place, and time.     Cranial Nerves: No cranial nerve deficit.     Deep Tendon Reflexes: Reflexes normal.  Psychiatric:        Behavior: Behavior normal.        Thought Content: Thought content normal.        Judgment: Judgment normal.     BP 123/72 (BP Location: Left Arm, Cuff Size: Normal)   Pulse 91   Temp 98.4 F (36.9 C)   Resp 12   Ht 5' 5.1" (1.654 m)   Wt 110 lb 8 oz (50.1 kg)   LMP  (LMP Unknown)   SpO2 98%   BMI 18.33 kg/m  Wt Readings from Last 3 Encounters:  01/28/20 110 lb 8 oz (50.1 kg)  11/26/19 115 lb (52.2 kg)  10/23/19 114 lb (51.7 kg)     There are no preventive care reminders to display for this patient.  There are no preventive care reminders to display for this patient.  Lab Results  Component Value Date   TSH 1.95 01/26/2019   Lab Results  Component Value Date   WBC 5.8 01/26/2019   HGB 15.0  01/26/2019   HCT 44.9 01/26/2019   MCV 97.7 01/26/2019   PLT 282.0 01/26/2019   Lab Results  Component Value Date   NA 138 01/26/2019   K 4.6 01/26/2019   CO2 27 01/26/2019   GLUCOSE 94 01/26/2019   BUN 18 01/26/2019   CREATININE 0.82 01/26/2019   BILITOT 0.6 01/26/2019   ALKPHOS 65 01/26/2019   AST 16 01/26/2019   ALT 17 06/27/2019   PROT 7.1 01/26/2019   ALBUMIN 4.5 01/26/2019   CALCIUM 9.7 01/26/2019   ANIONGAP 8 01/20/2016   GFR 69.12 01/26/2019   Lab Results  Component Value Date   CHOL 189 06/27/2019   Lab Results  Component Value Date   HDL 63 06/27/2019   Lab Results  Component Value Date   LDLCALC 113 (H) 06/27/2019   Lab Results  Component Value Date   TRIG 71 06/27/2019   Lab Results  Component Value Date   CHOLHDL 3.0 06/27/2019   No results found for: HGBA1C    Assessment & Plan:   Problem List Items Addressed This Visit    None    Visit Diagnoses    Physical exam    -  Primary   Relevant Orders   Basic metabolic panel   Lipid panel   CBC with Differential/Platelet   TSH   Hepatic function panel     -Health maintenance reviewed.  She plans to continue GYN follow-up for mammograms and DEXA scans. -She will get her Covid booster vaccine soon -Ordered future labs and she is nonfasting today and would like to come back next week for that. -We had a fairly long discussion regarding  pros and cons of statin therapy.  Her 10-year risk for CAD event based on most recent lipids was 8.4%. -We did encourage her to consider possible shingles vaccine sometime this next year -Continue with regular weightbearing exercise and daily calcium and vitamin D  No orders of the defined types were placed in this encounter.   Follow-up: No follow-ups on file.    Carolann Littler, MD

## 2020-01-28 NOTE — Patient Instructions (Signed)
Preventive Care 70 Years and Older, Female Preventive care refers to lifestyle choices and visits with your health care provider that can promote health and wellness. This includes:  A yearly physical exam. This is also called an annual well check.  Regular dental and eye exams.  Immunizations.  Screening for certain conditions.  Healthy lifestyle choices, such as diet and exercise. What can I expect for my preventive care visit? Physical exam Your health care provider will check:  Height and weight. These may be used to calculate body mass index (BMI), which is a measurement that tells if you are at a healthy weight.  Heart rate and blood pressure.  Your skin for abnormal spots. Counseling Your health care provider may ask you questions about:  Alcohol, tobacco, and drug use.  Emotional well-being.  Home and relationship well-being.  Sexual activity.  Eating habits.  History of falls.  Memory and ability to understand (cognition).  Work and work Statistician.  Pregnancy and menstrual history. What immunizations do I need?  Influenza (flu) vaccine  This is recommended every year. Tetanus, diphtheria, and pertussis (Tdap) vaccine  You may need a Td booster every 10 years. Varicella (chickenpox) vaccine  You may need this vaccine if you have not already been vaccinated. Zoster (shingles) vaccine  You may need this after age 70. Pneumococcal conjugate (PCV13) vaccine  One dose is recommended after age 70. Pneumococcal polysaccharide (PPSV23) vaccine  One dose is recommended after age 70. Measles, mumps, and rubella (MMR) vaccine  You may need at least one dose of MMR if you were born in 1957 or later. You may also need a second dose. Meningococcal conjugate (MenACWY) vaccine  You may need this if you have certain conditions. Hepatitis A vaccine  You may need this if you have certain conditions or if you travel or work in places where you may be exposed  to hepatitis A. Hepatitis B vaccine  You may need this if you have certain conditions or if you travel or work in places where you may be exposed to hepatitis B. Haemophilus influenzae type b (Hib) vaccine  You may need this if you have certain conditions. You may receive vaccines as individual doses or as more than one vaccine together in one shot (combination vaccines). Talk with your health care provider about the risks and benefits of combination vaccines. What tests do I need? Blood tests  Lipid and cholesterol levels. These may be checked every 5 years, or more frequently depending on your overall health.  Hepatitis C test.  Hepatitis B test. Screening  Lung cancer screening. You may have this screening every year starting at age 70 if you have a 30-pack-year history of smoking and currently smoke or have quit within the past 15 years.  Colorectal cancer screening. All adults should have this screening starting at age 70 and continuing until age 15. Your health care provider may recommend screening at age 70 if you are at increased risk. You will have tests every 1-10 years, depending on your results and the type of screening test.  Diabetes screening. This is done by checking your blood sugar (glucose) after you have not eaten for a while (fasting). You may have this done every 1-3 years.  Mammogram. This may be done every 1-2 years. Talk with your health care provider about how often you should have regular mammograms.  BRCA-related cancer screening. This may be done if you have a family history of breast, ovarian, tubal, or peritoneal cancers.  Other tests  Sexually transmitted disease (STD) testing.  Bone density scan. This is done to screen for osteoporosis. You may have this done starting at age 70. Follow these instructions at home: Eating and drinking  Eat a diet that includes fresh fruits and vegetables, whole grains, lean protein, and low-fat dairy products. Limit  your intake of foods with high amounts of sugar, saturated fats, and salt.  Take vitamin and mineral supplements as recommended by your health care provider.  Do not drink alcohol if your health care provider tells you not to drink.  If you drink alcohol: ? Limit how much you have to 0-1 drink a day. ? Be aware of how much alcohol is in your drink. In the [place] one drink equals one 12 oz bottle of beer (355 mL), one 5 oz glass of wine (148 mL), or one 1 oz glass of hard liquor (44 mL). Lifestyle  Take daily care of your teeth and gums.  Stay active. Exercise for at least 30 minutes on 5 or more days each week.  Do not use any products that contain nicotine or tobacco, such as cigarettes, e-cigarettes, and chewing tobacco. If you need help quitting, ask your health care provider.  If you are sexually active, practice safe sex. Use a condom or other form of protection in order to prevent STIs (sexually transmitted infections).  Talk with your health care provider about taking a low-dose aspirin or statin. What's next?  Go to your health care provider once a year for a well check visit.  Ask your health care provider how often you should have your eyes and teeth checked.  Stay up to date on all vaccines. This information is not intended to replace advice given to you by your health care provider. Make sure you discuss any questions you have with your health care provider. Document Revised: 02/02/2018 Document Reviewed: 02/02/2018 Elsevier Patient Education  2020 [employer].

## 2020-01-31 ENCOUNTER — Ambulatory Visit: Payer: PPO | Attending: Critical Care Medicine

## 2020-01-31 DIAGNOSIS — Z23 Encounter for immunization: Secondary | ICD-10-CM

## 2020-01-31 NOTE — Progress Notes (Signed)
   Covid-19 Vaccination Clinic  Name:  Tammy [name]    MRN: [id number] DOB: 03/09/1950  01/31/2020  Tammy [name] was observed post Covid-19 immunization for 15 minutes without incident. She was provided with Vaccine Information Sheet and instruction to access the V-Safe system.   Tammy [name] was instructed to call 911 with any severe reactions post vaccine: Marland Kitchen Difficulty breathing  . Swelling of face and throat  . A fast heartbeat  . A bad rash all over body  . Dizziness and weakness   Immunizations Administered    Name Date Dose VIS Date Route   Pfizer COVID-19 Vaccine 01/31/2020  1:29 PM 0.3 mL 12/12/2019 Intramuscular   Manufacturer: [place]   Lot: [id number]   NDC: [address]-0

## 2020-02-05 ENCOUNTER — Other Ambulatory Visit (INDEPENDENT_AMBULATORY_CARE_PROVIDER_SITE_OTHER): Payer: PPO

## 2020-02-05 ENCOUNTER — Other Ambulatory Visit: Payer: Self-pay

## 2020-02-05 DIAGNOSIS — Z0000 Encounter for general adult medical examination without abnormal findings: Secondary | ICD-10-CM

## 2020-02-05 DIAGNOSIS — E7800 Pure hypercholesterolemia, unspecified: Secondary | ICD-10-CM | POA: Diagnosis not present

## 2020-02-05 LAB — BASIC METABOLIC PANEL
BUN: 17 mg/dL (ref 6–23)
CO2: 29 mEq/L (ref 19–32)
Calcium: 9.3 mg/dL (ref 8.4–10.5)
Chloride: 103 mEq/L (ref 96–112)
Creatinine, Ser: 0.83 mg/dL (ref 0.40–1.20)
GFR: 71.62 mL/min (ref >60.00)
Glucose, Bld: 81 mg/dL (ref 70–99)
Potassium: 4.3 mEq/L (ref 3.5–5.1)
Sodium: 139 mEq/L (ref 135–145)

## 2020-02-05 LAB — CBC WITH DIFFERENTIAL/PLATELET
Basophils Absolute: 0 10*3/uL (ref 0.0–0.1)
Basophils Relative: 1 % (ref 0.0–3.0)
Eosinophils Absolute: 0.1 10*3/uL (ref 0.0–0.7)
Eosinophils Relative: 2.1 % (ref 0.0–5.0)
HCT: 43.6 % (ref 36.0–46.0)
Hemoglobin: 14.5 g/dL (ref 12.0–15.0)
Lymphocytes Relative: 33.5 % (ref 12.0–46.0)
Lymphs Abs: 1.4 10*3/uL (ref 0.7–4.0)
MCHC: 33.4 g/dL (ref 30.0–36.0)
MCV: 95.7 fl (ref 78.0–100.0)
Monocytes Absolute: 0.3 10*3/uL (ref 0.1–1.0)
Monocytes Relative: 7.5 % (ref 3.0–12.0)
Neutro Abs: 2.4 10*3/uL (ref 1.4–7.7)
Neutrophils Relative %: 55.9 % (ref 43.0–77.0)
Platelets: 276 10*3/uL (ref 150.0–400.0)
RBC: 4.55 Mil/uL (ref 3.87–5.11)
RDW: 12.7 % (ref 11.5–15.5)
WBC: 4.3 10*3/uL (ref 4.0–10.5)

## 2020-02-05 LAB — HEPATIC FUNCTION PANEL
ALT: 21 U/L (ref 0–35)
AST: 19 U/L (ref 0–37)
Albumin: 4.1 g/dL (ref 3.5–5.2)
Alkaline Phosphatase: 66 U/L (ref 39–117)
Bilirubin, Direct: 0.1 mg/dL (ref 0.0–0.3)
Total Bilirubin: 0.6 mg/dL (ref 0.2–1.2)
Total Protein: 6.7 g/dL (ref 6.0–8.3)

## 2020-02-05 LAB — LIPID PANEL
Cholesterol: 182 mg/dL (ref 0–200)
HDL: 56.3 mg/dL (ref >39.00)
LDL Cholesterol: 112 mg/dL — ABNORMAL HIGH (ref 0–99)
NonHDL: 125.57
Total CHOL/HDL Ratio: 3
Triglycerides: 68 mg/dL (ref 0.0–149.0)
VLDL: 13.6 mg/dL (ref 0.0–40.0)

## 2020-02-05 LAB — TSH: TSH: 1.47 u[IU]/mL (ref 0.35–4.50)

## 2020-02-05 NOTE — Addendum Note (Signed)
Addended by: Marrion Coy on: 02/05/2020 08:11 AM   Modules accepted: Orders

## 2020-02-29 ENCOUNTER — Other Ambulatory Visit: Payer: Self-pay | Admitting: Cardiology

## 2020-02-29 NOTE — Telephone Encounter (Signed)
Pt last saw Dr Marlou Porch 03/16/19, pt is due for follow-up.  Pt has follow-up appt scheduled for 03/17/20 with Dr Marlou Porch, last labs 02/05/20 Creat 0.82, age 70, weight 50.1kg, CrCl 50.49, based on CrCl pt is on appropriate dosage of Xarelto 20mg  QD.  Will refill rx.

## 2020-03-07 ENCOUNTER — Telehealth: Payer: Self-pay | Admitting: Physician Assistant

## 2020-03-07 NOTE — Telephone Encounter (Signed)
Pt called because she began having palpitations.  She has been checking her HR with her watch. Range 60s-130s.  She took a metoprolol 1-1/2 hr ago, HR seems a little calmer now.   Can she take more?  Advised to ck BP. If SBP > 110, ok to take another metoprolol (up to 4 x day) to manage the palpitations.  Advised don't miss doses of Xarelto.   Let us know if sx do not improve.  It won't kill her, do not need to go to the ER if HR ok and no sig sx.  Rosaria Ferries, PA-C 03/07/2020 7:42 PM

## 2020-03-12 ENCOUNTER — Telehealth: Payer: Self-pay | Admitting: Family Medicine

## 2020-03-12 NOTE — Telephone Encounter (Signed)
Patient is calling and stated that she tested positive and wanted to see if provider had any recommendations, please advise. CB is [phone]

## 2020-03-12 NOTE — Telephone Encounter (Signed)
Virtual visit scheduled for patient.  She had a positive home test but also has fever and body aches.

## 2020-03-13 ENCOUNTER — Other Ambulatory Visit: Payer: PPO

## 2020-03-14 ENCOUNTER — Telehealth (INDEPENDENT_AMBULATORY_CARE_PROVIDER_SITE_OTHER): Payer: PPO | Admitting: Family Medicine

## 2020-03-14 DIAGNOSIS — U071 COVID-19: Secondary | ICD-10-CM | POA: Diagnosis not present

## 2020-03-14 NOTE — Progress Notes (Signed)
Patient ID: Tammy [name], female   DOB: 26-Aug-1950, 70 y.o.   MRN: [id number]  This visit type was conducted due to national recommendations for restrictions regarding the COVID-19 pandemic in an effort to limit this patient's exposure and mitigate transmission in our community.   Virtual Visit via Video Note  I connected with [name] on 03/14/20 at  1:45 PM EST by a video enabled telemedicine application and verified that I am speaking with the correct person using two identifiers.  Location patient: home Location provider:work or home office Persons participating in the virtual visit: patient, provider  I discussed the limitations of evaluation and management by telemedicine and the availability of in person appointments. The patient expressed understanding and agreed to proceed.   HPI:  Tammy [name] had onset this past Monday of some mild body aches, headache, low-grade fever, cough, mild sore throat.  She got a rapid home COVID test which came back positive and this was on Tuesday.  She has not had any nausea, vomiting, or diarrhea.  No dyspnea.  She states she feels much better today.  Her symptoms have essentially resolved.  She has been much more active at home.  She has had COVID vaccines including booster back in December.  She is not sure but thinks she may have gotten infection from one of her grandchildren.  She watched her 23 and 62-year-old grandchildren recently.  Her fever has resolved.  Cough is been very minimal.  She is generally very healthy.  She has history of atrial fibrillation and is on chronic Xarelto and also takes metoprolol as needed but no other medications.  ROS: See pertinent positives and negatives per HPI.  Past Medical History:  Diagnosis Date   Hyperlipidemia     Past Surgical History:  Procedure Laterality Date   DILATION AND CURETTAGE OF UTERUS N/A     Family History  Problem Relation Age of Onset   Heart disease Mother 68       CABG    Cancer Mother        bladder cancer   Heart disease Father        MVP, atrial fibrilation   Cancer Father        colon cancer   Stroke Father    Cancer Sister        colon cancer   Stroke Sister     SOCIAL HX: Non-smoker   Current Outpatient Medications:    metoprolol tartrate (LOPRESSOR) 25 MG tablet, Take 1 tablet (25 mg total) by mouth as needed., Disp: 30 tablet, Rfl: 11   sodium fluoride (FLUORISHIELD) 1.1 % GEL dental gel, PreviDent 5000 Booster Plus 1.1 % dental paste, Disp: , Rfl:    XARELTO 20 MG TABS tablet, TAKE 1 TABLET(20 MG) BY MOUTH DAILY WITH SUPPER, Disp: 30 tablet, Rfl: 5  EXAM:  VITALS per patient if applicable:  GENERAL: alert, oriented, appears well and in no acute distress  HEENT: atraumatic, conjunttiva clear, no obvious abnormalities on inspection of external nose and ears  NECK: normal movements of the head and neck  LUNGS: on inspection no signs of respiratory distress, breathing rate appears normal, no obvious gross SOB, gasping or wheezing  CV: no obvious cyanosis  MS: moves all visible extremities without noticeable abnormality  PSYCH/NEURO: pleasant and cooperative, no obvious depression or anxiety, speech and thought processing grossly intact  ASSESSMENT AND PLAN:  Discussed the following assessment and plan:  COVID-19 infection.  Patient symptomatically much better.  Her  symptoms fortunately were very mild.  -Continue plenty of fluids and rest -Reviewed CDC guidelines for isolation -We discussed things like referral to monoclonal antibody infusion clinic but since her symptoms have basically resolved we agreed that this was not necessary at this time.  Follow-up for any dyspnea or other concerns     I discussed the assessment and treatment plan with the patient. The patient was provided an opportunity to ask questions and all were answered. The patient agreed with the plan and demonstrated an understanding of the  instructions.   The patient was advised to call back or seek an in-person evaluation if the symptoms worsen or if the condition fails to improve as anticipated.     Carolann Littler, MD

## 2020-03-17 ENCOUNTER — Encounter: Payer: Self-pay | Admitting: Cardiology

## 2020-03-17 ENCOUNTER — Telehealth (INDEPENDENT_AMBULATORY_CARE_PROVIDER_SITE_OTHER): Payer: PPO | Admitting: Cardiology

## 2020-03-17 ENCOUNTER — Other Ambulatory Visit: Payer: Self-pay

## 2020-03-17 VITALS — Ht 65.0 in | Wt 110.0 lb

## 2020-03-17 DIAGNOSIS — I6521 Occlusion and stenosis of right carotid artery: Secondary | ICD-10-CM | POA: Diagnosis not present

## 2020-03-17 DIAGNOSIS — E782 Mixed hyperlipidemia: Secondary | ICD-10-CM

## 2020-03-17 DIAGNOSIS — I480 Paroxysmal atrial fibrillation: Secondary | ICD-10-CM

## 2020-03-17 MED ORDER — METOPROLOL SUCCINATE ER 50 MG PO TB24: 50.0000 mg | ORAL_TABLET | Freq: Every day | ORAL | 3 refills | Status: DC

## 2020-03-17 NOTE — Addendum Note (Signed)
Addended by: Shellia Cleverly on: 03/17/2020 11:10 AM   Modules accepted: Orders

## 2020-03-17 NOTE — Progress Notes (Signed)
Virtual Visit via Video Note   This visit type was conducted due to national recommendations for restrictions regarding the COVID-19 Pandemic (e.g. social distancing) in an effort to limit this patient's exposure and mitigate transmission in our community.  Due to her co-morbid illnesses, this patient is at least at moderate risk for complications without adequate follow up.  This format is felt to be most appropriate for this patient at this time.  All issues noted in this document were discussed and addressed.  A limited physical exam was performed with this format.  Please refer to the patient's chart for her consent to telehealth for Tammy [name].       Date:  03/17/2020   ID:  Tammy [name] Apr 24, 1950, MRN [id number] The patient was identified using 2 identifiers.  Patient Location: Home Provider Location: Home Office  PCP:  Eulas Post, MD  Cardiologist:  Candee Furbish, MD  Electrophysiologist:  None   Evaluation Performed:  Follow-Up Visit  Chief Complaint: Palpitation follow-up  History of Present Illness:    Tammy [name] is a 70 y.o. female here for follow-up of atrial fibrillation previous rapid ventricular response of 141 bpm spontaneously converted.  Episode was about 3 hours duration.  CHADSVASc score at that time was 3.  She was placed on Xarelto.  Troponins were normal and EF was normal on echocardiogram.  Overall she is still feeling these episodes of palpitations/tachycardia.  They do occur intermittently.  She usually has at least 1 episode per day.  Sometimes the episodes will result in rapid heart rate of about 140 beats a minute and then break for 15 seconds or so and then go back to 140 beats a minute.  Previous monitor in June 2021 personally reviewed shows episodes of what appear to be paroxysmal atrial tachycardia.  I do not see any true episodes of atrial fibrillation at that time.  She does enjoy tennis.  One time she did have to  stop her again when she was going too quickly.  This sometimes makes her anxious.  She is clearly symptomatic with these palpitations.  She will take an occasional extra Toprol.  In fact she called Rosaria Ferries, PA on call the other day who advised her to take her metoprolol but to watch out for any signs of significant hypotension.  She also recently just got over Covid, approximately 3 days of cold-like symptoms.  She was fully vaccinated and boosted.  No residual symptoms  She remembers having PACs for many years.  Her father had PACs as well.  She recounts that her father had atrial fibrillation but refused to take anticoagulation and ended up having a stroke.  Her mother had CABG in her 29s.  No fevers chills nausea vomiting syncope bleeding.  No bleeding issues with Xarelto.  When one of her sisters had carotid stenosis bilaterally and a stroke.    The patient does not have symptoms concerning for COVID-19 infection (fever, chills, cough, or new shortness of breath).    Past Medical History:  Diagnosis Date  . Hyperlipidemia    Past Surgical History:  Procedure Laterality Date  . DILATION AND CURETTAGE OF UTERUS N/A      Current Meds  Medication Sig  . metoprolol tartrate (LOPRESSOR) 25 MG tablet Take 1 tablet (25 mg total) by mouth as needed.  . sodium fluoride (FLUORISHIELD) 1.1 % GEL dental gel PreviDent 5000 Booster Plus 1.1 % dental paste  . XARELTO 20 MG TABS tablet  TAKE 1 TABLET(20 MG) BY MOUTH DAILY WITH SUPPER     Allergies:   Penicillins, Codeine, Contrast media [iodinated diagnostic agents], and Other   Social History   Tobacco Use  . Smoking status: Former Smoker    Packs/day: 1.00    Years: 5.00    Pack years: 5.00    Types: Cigarettes    Quit date: 05/26/1973    Years since quitting: 46.8  . Smokeless tobacco: Never Used  Vaping Use  . Vaping Use: Never used  Substance Use Topics  . Alcohol use: Yes    Alcohol/week: 1.0 standard drink    Types: 1  Glasses of wine per week    Comment: 4 x's a wk a glass of wine  . Drug use: Never     Family Hx: The patient's family history includes Cancer in her father, mother, and sister; Heart disease in her father; Heart disease (age of onset: 5) in her mother; Stroke in her father and sister.  ROS:   Please see the history of present illness.     All other systems reviewed and are negative.   Prior CV studies:   The following studies were reviewed today:  Echocardiogram 02/19/2016 showed EF of 60% with trivial pericardial effusion  Carotid ultrasound 05/26/2017 showed mild right carotid artery plaque  Labs/Other Tests and Data Reviewed:    EKG:   EKG in 2020 showed normal sinus rhythm with sinus arrhythmia atrial enlargement  Recent Labs: 02/05/2020: ALT 21; BUN 17; Creatinine, Ser 0.83; Hemoglobin 14.5; Platelets 276.0; Potassium 4.3; Sodium 139; TSH 1.47   Recent Lipid Panel Lab Results  Component Value Date/Time   CHOL 182 02/05/2020 08:10 AM   CHOL 189 06/27/2019 08:44 AM   TRIG 68.0 02/05/2020 08:10 AM   HDL 56.30 02/05/2020 08:10 AM   HDL 63 06/27/2019 08:44 AM   CHOLHDL 3 02/05/2020 08:10 AM   LDLCALC 112 (H) 02/05/2020 08:10 AM   LDLCALC 113 (H) 06/27/2019 08:44 AM   LDLDIRECT 142.6 03/04/2010 10:01 AM    Wt Readings from Last 3 Encounters:  03/17/20 110 lb (49.9 kg)  01/28/20 110 lb 8 oz (50.1 kg)  11/26/19 115 lb (52.2 kg)     Risk Assessment/Calculations:    CHA2DS2-VASc Score = 3  This indicates a 3.2% annual risk of stroke. The patient's score is based upon: CHF History: No HTN History: No Diabetes History: No Stroke History: No Vascular Disease History: Yes Age Score: 1 Gender Score: 1     Objective:    Vital Signs:  Ht 5\' 5"  (1.651 m)   Wt 110 lb (49.9 kg)   LMP  (LMP Unknown)   BMI 18.30 kg/m    VITAL SIGNS:  reviewed GEN:  no acute distress EYES:  sclerae anicteric, EOMI - Extraocular Movements Intact RESPIRATORY:  normal respiratory  effort, symmetric expansion SKIN:  no rash, lesions or ulcers. MUSCULOSKELETAL:  no obvious deformities. NEURO:  alert and oriented x 3, no obvious focal deficit PSYCH:  normal affect  ASSESSMENT & PLAN:    Paroxysmal atrial fibrillation -Previous 3-hour episode, perhaps a glass of wine had triggered it.  Echocardiogram reassuring.  She is decreased her caffeine.  She has a family history of A. fib Father with stroke after A. fib.  Takes metoprolol on as-needed basis.  Has seen atrial fibrillation clinic in the past. -Rare premature atrial contractions and PVCs noted.  No atrial fibrillation on monitoring 11/2018.  Paroxysmal atrial tachycardia/SVT -Seen on monitor 07/2019.  She  is still having symptomatic episodes of these. -I will give her Toprol-XL 50 mg once a day to take on a daily basis to see if this will help suppress some of these arrhythmias.  I will see her back in 3 months.  If the palpitations are still there, we can consider flecainide.  We discussed today.  She can also take the short acting metoprolol for breakthrough.  Since metoprolol does not affect blood pressure that much, I be comfortable with her taking an additional metoprolol even if her blood pressure is in the [id number] systolic range.  Hyperlipidemia -Prior LDL 113 checked by her primary physician.  With her mild carotid plaque and family history would benefit from Crestor.  Diet therapy.  Chronic anticoagulation -Continue with Xarelto CHADSVASc 3.  Female, carotid artery plaque -Prior creatinine 0.8 hemoglobin 15.  Outside labs.  Mild carotid artery plaque -Mild right carotid artery stenosis, no flow limitation.  Abnormal EKG -Similar EKGs have shown "septal infarct or anterior infarct pattern, poor R wave progression ".  I went back and reviewed several different EKGs.  I reviewed her EKG just prior to her echocardiogram performed in 2017 and the echocardiogram did not show any evidence of anterior wall motion  abnormality.  EKG is demonstrating a false positive likely secondary to lead placement       COVID-19 Education: The signs and symptoms of COVID-19 were discussed with the patient and how to seek care for testing (follow up with PCP or arrange E-visit).  The importance of social distancing was discussed today.  Time:   Today, I have spent 22minutes with the patient with telehealth technology discussing the above problems.     Medication Adjustments/Labs and Tests Ordered: Current medicines are reviewed at length with the patient today.  Concerns regarding medicines are outlined above.   Tests Ordered: No orders of the defined types were placed in this encounter.   Medication Changes: No orders of the defined types were placed in this encounter.   Follow Up:  In Person in 3 month(s)  Signed, Candee Furbish, MD  03/17/2020 10:36 AM    [place]

## 2020-03-17 NOTE — Patient Instructions (Signed)
Medication Instructions:  Please start Metoprolol Succinate 50 mg once a day. You may continue to take metoprolol tartrate as needed. Continue all other medications as listed.  *If you need a refill on your cardiac medications before your next appointment, please call your pharmacy*  Follow-Up: At [facility], you and your health needs are our priority.  As part of our continuing mission to provide you with exceptional heart care, we have created designated Provider Care Teams.  These Care Teams include your primary Cardiologist (physician) and Advanced Practice Providers (APPs -  Physician Assistants and Nurse Practitioners) who all work together to provide you with the care you need, when you need it.  We recommend signing up for the patient portal called "MyChart".  Sign up information is provided on this After Visit Summary.  MyChart is used to connect with patients for Virtual Visits (Telemedicine).  Patients are able to view lab/test results, encounter notes, upcoming appointments, etc.  Non-urgent messages can be sent to your provider as well.   To learn more about what you can do with MyChart, go to [email or web address].    Your next appointment:   3 month(s)  The format for your next appointment:   In Person  Provider:   Candee Furbish, MD   Thank you for choosing [facility]!!

## 2020-03-27 ENCOUNTER — Other Ambulatory Visit: Payer: Self-pay | Admitting: Cardiology

## 2020-05-07 DIAGNOSIS — H1131 Conjunctival hemorrhage, right eye: Secondary | ICD-10-CM | POA: Diagnosis not present

## 2020-05-15 NOTE — Progress Notes (Signed)
Subjective:   Tammy [name] is a 70 y.o. female who presents for an Initial Medicare Annual Wellness Visit.  Review of Systems    N/A Cardiac Risk Factors include: advanced age (>77men, >44 women)     Objective:    Today's Vitals   There is no height or weight on file to calculate BMI.  Advanced Directives 05/16/2020 01/20/2016 02/15/2015  Does Patient Have a Medical Advance Directive? Yes Yes Yes  Type of Paramedic of [place];Living will [place];Living will -  Does patient want to make changes to medical advance directive? No - Patient declined - -  Copy of [place] in Chart? No - copy requested - -    Current Medications (verified) Outpatient Encounter Medications as of 05/16/2020  Medication Sig  . calcium-vitamin D 250-100 MG-UNIT tablet Take 2 tablets by mouth 2 (two) times daily.  . metoprolol tartrate (LOPRESSOR) 25 MG tablet TAKE 1 TABLET BY MOUTH AS NEEDED  . sodium fluoride (FLUORISHIELD) 1.1 % GEL dental gel PreviDent 5000 Booster Plus 1.1 % dental paste  . XARELTO 20 MG TABS tablet TAKE 1 TABLET(20 MG) BY MOUTH DAILY WITH SUPPER  . metoprolol succinate (TOPROL-XL) 50 MG 24 hr tablet Take 1 tablet (50 mg total) by mouth daily. Take with or immediately following a meal. (Patient not taking: Reported on 05/16/2020)   No facility-administered encounter medications on file as of 05/16/2020.    Allergies (verified) Penicillins, Codeine, Contrast media [iodinated diagnostic agents], and Other   History: Past Medical History:  Diagnosis Date  . Hyperlipidemia    Past Surgical History:  Procedure Laterality Date  . DILATION AND CURETTAGE OF UTERUS N/A    Family History  Problem Relation Age of Onset  . Heart disease Mother 50       CABG  . Cancer Mother        bladder cancer  . Heart disease Father        MVP, atrial fibrilation  . Cancer Father        colon cancer  . Stroke Father   .  Cancer Sister        colon cancer  . Stroke Sister    Social History   Socioeconomic History  . Marital status: Married    Spouse name: Not on file  . Number of children: Not on file  . Years of education: Not on file  . Highest education level: Not on file  Occupational History  . Not on file  Tobacco Use  . Smoking status: Former Smoker    Packs/day: 1.00    Years: 5.00    Pack years: 5.00    Types: Cigarettes    Quit date: 05/26/1973    Years since quitting: 47.0  . Smokeless tobacco: Never Used  Vaping Use  . Vaping Use: Never used  Substance and Sexual Activity  . Alcohol use: Yes    Alcohol/week: 1.0 standard drink    Types: 1 Glasses of wine per week    Comment: 4 x's a wk a glass of wine  . Drug use: Never  . Sexual activity: Not on file  Other Topics Concern  . Not on file  Social History Narrative  . Not on file   Social Determinants of Health   Financial Resource Strain: Low Risk   . Difficulty of Paying Living Expenses: Not hard at all  Food Insecurity: No Food Insecurity  . Worried About [employer] of  Food in the Last Year: Never true  . Ran Out of Food in the Last Year: Never true  Transportation Needs: No Transportation Needs  . Lack of Transportation (Medical): No  . Lack of Transportation (Non-Medical): No  Physical Activity: Sufficiently Active  . Days of Exercise per Week: 4 days  . Minutes of Exercise per Session: 60 min  Stress: No Stress Concern Present  . Feeling of Stress : Not at all  Social Connections: Moderately Integrated  . Frequency of Communication with Friends and Family: More than three times a week  . Frequency of Social Gatherings with Friends and Family: More than three times a week  . Attends Religious Services: Never  . Active Member of Clubs or Organizations: Yes  . Attends Archivist Meetings: More than 4 times per year  . Marital Status: Married    Tobacco Counseling Counseling given: Not  Answered   Clinical Intake:  Pre-visit preparation completed: Yes        Nutritional Risks: None Diabetes: No  How often do you need to have someone help you when you read instructions, pamphlets, or other written materials from your doctor or pharmacy?: 1 - Never What is the last grade level you completed in school?: college  Diabetic?no  Interpreter Needed?: No      Activities of Daily Living In your present state of health, do you have any difficulty performing the following activities: 05/16/2020  Hearing? N  Vision? N  Difficulty concentrating or making decisions? N  Walking or climbing stairs? N  Dressing or bathing? N  Doing errands, shopping? N  Preparing Food and eating ? N  Using the Toilet? N  In the past six months, have you accidently leaked urine? Y  Comment has occassional leakage with urgency  Do you have problems with loss of bowel control? N  Managing your Medications? N  Managing your Finances? N  Housekeeping or managing your Housekeeping? N  Some recent data might be hidden    Patient Care Team: Eulas Post, MD as PCP - General Jerline Pain, MD as PCP - Cardiology (Cardiology)  Indicate any recent Medical Services you may have received from other than [facility] providers in the past year (date may be approximate).     Assessment:   This is a routine wellness examination for Luddie.  Hearing/Vision screen  Hearing Screening   125Hz  250Hz  500Hz  1000Hz  2000Hz  3000Hz  4000Hz  6000Hz  8000Hz   Right ear:           Left ear:           Vision Screening Comments: Annual eye exam wear reading glasses  Dietary issues and exercise activities discussed: Current Exercise Habits: Home exercise routine, Type of exercise: walking (Tennis, Pickleball), Time (Minutes): 45, Frequency (Times/Week): 4, Weekly Exercise (Minutes/Week): 180, Intensity: Moderate, Exercise limited by: None identified  Goals    . DIET - INCREASE LEAN PROTEINS     Low  cholesterol      Depression Screen PHQ 2/9 Scores 05/16/2020 01/26/2019 01/09/2018 02/27/2016 01/31/2015  PHQ - 2 Score 0 0 0 0 0  PHQ- 9 Score - 0 - - -    Fall Risk Fall Risk  05/16/2020 09/18/2019 01/26/2019 01/17/2019 01/09/2018  Falls in the past year? 0 0 0 0 0  Comment - Emmi Telephone Survey: data to providers prior to load - Emmi Telephone Survey: data to providers prior to load -  Number falls in past yr: 0 - - - -  Injury with Fall? 0 - - - -  Follow up Falls evaluation completed - - - -    FALL RISK PREVENTION PERTAINING TO THE HOME:  Any stairs in or around the home? Yes  If so, are there any without handrails? Yes  Home free of loose throw rugs in walkways, pet beds, electrical cords, etc? Yes  Adequate lighting in your home to reduce risk of falls? Yes   ASSISTIVE DEVICES UTILIZED TO PREVENT FALLS:  Life alert? No  Use of a cane, walker or w/c? No  Grab bars in the bathroom? No  Shower chair or bench in shower? No  Elevated toilet seat or a handicapped toilet? No   Cognitive Function:        Immunizations Immunization History  Administered Date(s) Administered  . Fluad Quad(high Dose 65+) 01/26/2019  . Influenza, High Dose Seasonal PF 01/31/2015, 01/13/2016, 12/06/2016, 01/09/2018  . Influenza,inj,Quad PF,6+ Mos 01/09/2020  . Influenza-Unspecified 01/11/2018  . PFIZER(Purple Top)SARS-COV-2 Vaccination 04/14/2019, 05/15/2019, 01/31/2020  . Pneumococcal Conjugate-13 10/13/2015  . Pneumococcal Polysaccharide-23 03/14/2017  . Td 07/09/2004, 01/22/2005  . Tdap 05/01/2014  . Unspecified SARS-COV-2 Vaccination 04/14/2019, 05/15/2019    TDAP status: Up to date  Flu Vaccine status: Up to date  Pneumococcal vaccine status: Up to date  Covid-19 vaccine status: Completed vaccines  Qualifies for Shingles Vaccine? Yes   Zostavax completed Yes   Shingrix Completed?: No.    Education has been provided regarding the importance of this vaccine. Patient has been  advised to call insurance company to determine out of pocket expense if they have not yet received this vaccine. Advised may also receive vaccine at local pharmacy or Health Dept. Verbalized acceptance and understanding.  Screening Tests Health Maintenance  Topic Date Due  . MAMMOGRAM  01/09/2021  . COLONOSCOPY (Pts 45-43yrs Insurance coverage will need to be confirmed)  06/17/2021  . TETANUS/TDAP  04/30/2024  . INFLUENZA VACCINE  Completed  . DEXA SCAN  Completed  . COVID-19 Vaccine  Completed  . Hepatitis C Screening  Completed  . PNA vac Low Risk Adult  Completed  . HPV VACCINES  Aged Out    Health Maintenance  There are no preventive care reminders to display for this patient.  Colorectal cancer screening: Type of screening: Colonoscopy. Completed 5. Repeat every 05/28/2021 years  Mammogram status: Completed 01/10/2020. Repeat every year  Bone Density status: Ordered 05/16/2020. Pt provided with contact info and advised to call to schedule appt.  Lung Cancer Screening: (Low Dose CT Chest recommended if Age 23-80 years, 30 pack-year currently smoking OR have quit w/in 15years.) does not qualify.   Lung Cancer Screening Referral: n/a  Additional Screening:  Hepatitis C Screening: does qualify; Completed 12/06/2016  Vision Screening: Recommended annual ophthalmology exams for early detection of glaucoma and other disorders of the eye. Is the patient up to date with their annual eye exam?  Yes  Who is the provider or what is the name of the office in which the patient attends annual eye exams? Dr. Macarthur Critchley If pt is not established with a provider, would they like to be referred to a provider to establish care? No .   Dental Screening: Recommended annual dental exams for proper oral hygiene  Community Resource Referral / Chronic Care Management: CRR required this visit?  No   CCM required this visit?  No      Plan:     I have personally reviewed and noted the  following in the patient's  chart:   . Medical and social history . Use of alcohol, tobacco or illicit drugs  . Current medications and supplements . Functional ability and status . Nutritional status . Physical activity . Advanced directives . List of other physicians . Hospitalizations, surgeries, and ER visits in previous 12 months . Vitals . Screenings to include cognitive, depression, and falls . Referrals and appointments  In addition, I have reviewed and discussed with patient certain preventive protocols, quality metrics, and best practice recommendations. A written personalized care plan for preventive services as well as general preventive health recommendations were provided to patient.     Randel Pigg, LPN   [id number]   Nurse Notes: none

## 2020-05-16 ENCOUNTER — Ambulatory Visit (INDEPENDENT_AMBULATORY_CARE_PROVIDER_SITE_OTHER): Payer: PPO

## 2020-05-16 DIAGNOSIS — Z0000 Encounter for general adult medical examination without abnormal findings: Secondary | ICD-10-CM | POA: Diagnosis not present

## 2020-05-16 NOTE — Patient Instructions (Signed)
Ms. Tammy [name] , Thank you for taking time to come for your Medicare Wellness Visit. I appreciate your ongoing commitment to your health goals. Please review the following plan we discussed and let me know if I can assist you in the future.   Screening recommendations/referrals: Colonoscopy: Up to date, next due 06/17/2021 Mammogram: Up to date, next due 01/09/2021 Bone Density: Please have your OB/GYN send [place] over your most  Bone Density report so that we may update your chart Recommended yearly ophthalmology/optometry visit for glaucoma screening and checkup Recommended yearly dental visit for hygiene and checkup  Vaccinations: Influenza vaccine: Up to date, next due fall 2022  Pneumococcal vaccine: Completed series Tdap vaccine: Up to date, next due 04/30/2024 Shingles vaccine: Currently due for Shingrix, if you would like to receive we recommend that you do so at your local pharmacy     Advanced directives: Please bring in copies of your advanced medical directives so that we may scan them into your chart.   Conditions/risks identified: None   Next appointment: None    Preventive Care 71 Years and Older, Female Preventive care refers to lifestyle choices and visits with your health care provider that can promote health and wellness. What does preventive care include?  A yearly physical exam. This is also called an annual well check.  Dental exams once or twice a year.  Routine eye exams. Ask your health care provider how often you should have your eyes checked.  Personal lifestyle choices, including:  Daily care of your teeth and gums.  Regular physical activity.  Eating a healthy diet.  Avoiding tobacco and drug use.  Limiting alcohol use.  Practicing safe sex.  Taking low-dose aspirin every day.  Taking vitamin and mineral supplements as recommended by your health care provider. What happens during an annual well check? The services and screenings done by your  health care provider during your annual well check will depend on your age, overall health, lifestyle risk factors, and family history of disease. Counseling  Your health care provider may ask you questions about your:  Alcohol use.  Tobacco use.  Drug use.  Emotional well-being.  Home and relationship well-being.  Sexual activity.  Eating habits.  History of falls.  Memory and ability to understand (cognition).  Work and work Statistician.  Reproductive health. Screening  You may have the following tests or measurements:  Height, weight, and BMI.  Blood pressure.  Lipid and cholesterol levels. These may be checked every 5 years, or more frequently if you are over 42 years old.  Skin check.  Lung cancer screening. You may have this screening every year starting at age 68 if you have a 30-pack-year history of smoking and currently smoke or have quit within the past 15 years.  Fecal occult blood test (FOBT) of the stool. You may have this test every year starting at age 47.  Flexible sigmoidoscopy or colonoscopy. You may have a sigmoidoscopy every 5 years or a colonoscopy every 10 years starting at age 13.  Hepatitis C blood test.  Hepatitis B blood test.  Sexually transmitted disease (STD) testing.  Diabetes screening. This is done by checking your blood sugar (glucose) after you have not eaten for a while (fasting). You may have this done every 1-3 years.  Bone density scan. This is done to screen for osteoporosis. You may have this done starting at age 24.  Mammogram. This may be done every 1-2 years. Talk to your health care provider about  how often you should have regular mammograms. Talk with your health care provider about your test results, treatment options, and if necessary, the need for more tests. Vaccines  Your health care provider may recommend certain vaccines, such as:  Influenza vaccine. This is recommended every year.  Tetanus, diphtheria, and  acellular pertussis (Tdap, Td) vaccine. You may need a Td booster every 10 years.  Zoster vaccine. You may need this after age 16.  Pneumococcal 13-valent conjugate (PCV13) vaccine. One dose is recommended after age 63.  Pneumococcal polysaccharide (PPSV23) vaccine. One dose is recommended after age 53. Talk to your health care provider about which screenings and vaccines you need and how often you need them. This information is not intended to replace advice given to you by your health care provider. Make sure you discuss any questions you have with your health care provider. Document Released: 03/07/2015 Document Revised: 10/29/2015 Document Reviewed: 12/10/2014 Elsevier Interactive Patient Education  2017 [place] Prevention in the Home Falls can cause injuries. They can happen to people of all ages. There are many things you can do to make your home safe and to help prevent falls. What can I do on the outside of my home?  Regularly fix the edges of walkways and driveways and fix any cracks.  Remove anything that might make you trip as you walk through a door, such as a raised step or threshold.  Trim any bushes or trees on the path to your home.  Use bright outdoor lighting.  Clear any walking paths of anything that might make someone trip, such as rocks or tools.  Regularly check to see if handrails are loose or broken. Make sure that both sides of any steps have handrails.  Any raised decks and porches should have guardrails on the edges.  Have any leaves, snow, or ice cleared regularly.  Use sand or salt on walking paths during winter.  Clean up any spills in your garage right away. This includes oil or grease spills. What can I do in the bathroom?  Use night lights.  Install grab bars by the toilet and in the tub and shower. Do not use towel bars as grab bars.  Use non-skid mats or decals in the tub or shower.  If you need to sit down in the shower, use  a plastic, non-slip stool.  Keep the floor dry. Clean up any water that spills on the floor as soon as it happens.  Remove soap buildup in the tub or shower regularly.  Attach bath mats securely with double-sided non-slip rug tape.  Do not have throw rugs and other things on the floor that can make you trip. What can I do in the bedroom?  Use night lights.  Make sure that you have a light by your bed that is easy to reach.  Do not use any sheets or blankets that are too big for your bed. They should not hang down onto the floor.  Have a firm chair that has side arms. You can use this for support while you get dressed.  Do not have throw rugs and other things on the floor that can make you trip. What can I do in the kitchen?  Clean up any spills right away.  Avoid walking on wet floors.  Keep items that you use a lot in easy-to-reach places.  If you need to reach something above you, use a strong step stool that has a grab bar.  Keep  electrical cords out of the way.  Do not use floor polish or wax that makes floors slippery. If you must use wax, use non-skid floor wax.  Do not have throw rugs and other things on the floor that can make you trip. What can I do with my stairs?  Do not leave any items on the stairs.  Make sure that there are handrails on both sides of the stairs and use them. Fix handrails that are broken or loose. Make sure that handrails are as long as the stairways.  Check any carpeting to make sure that it is firmly attached to the stairs. Fix any carpet that is loose or worn.  Avoid having throw rugs at the top or bottom of the stairs. If you do have throw rugs, attach them to the floor with carpet tape.  Make sure that you have a light switch at the top of the stairs and the bottom of the stairs. If you do not have them, ask someone to add them for you. What else can I do to help prevent falls?  Wear shoes that:  Do not have high heels.  Have  rubber bottoms.  Are comfortable and fit you well.  Are closed at the toe. Do not wear sandals.  If you use a stepladder:  Make sure that it is fully opened. Do not climb a closed stepladder.  Make sure that both sides of the stepladder are locked into place.  Ask someone to hold it for you, if possible.  Clearly mark and make sure that you can see:  Any grab bars or handrails.  First and last steps.  Where the edge of each step is.  Use tools that help you move around (mobility aids) if they are needed. These include:  Canes.  Walkers.  Scooters.  Crutches.  Turn on the lights when you go into a dark area. Replace any light bulbs as soon as they burn out.  Set up your furniture so you have a clear path. Avoid moving your furniture around.  If any of your floors are uneven, fix them.  If there are any pets around you, be aware of where they are.  Review your medicines with your doctor. Some medicines can make you feel dizzy. This can increase your chance of falling. Ask your doctor what other things that you can do to help prevent falls. This information is not intended to replace advice given to you by your health care provider. Make sure you discuss any questions you have with your health care provider. Document Released: 12/05/2008 Document Revised: 07/17/2015 Document Reviewed: 03/15/2014 Elsevier Interactive Patient Education  2017 [employer].

## 2020-06-25 ENCOUNTER — Ambulatory Visit: Payer: PPO | Admitting: Cardiology

## 2020-06-25 NOTE — Progress Notes (Deleted)
Cardiology Office Note:    Date:  06/25/2020   ID:  Tammy [name], DOB [birth year], MRN [id number]  PCP:  Eulas Post, MD   [facility] Providers Cardiologist:  Candee Furbish, MD { Click to update primary MD,subspecialty MD or APP then REFRESH:1}    Referring MD: Eulas Post, MD   No chief complaint on file. ***  History of Present Illness:    Tammy [name] is a 70 y.o. female with a hx of ***  Past Medical History:  Diagnosis Date  . Hyperlipidemia     Past Surgical History:  Procedure Laterality Date  . DILATION AND CURETTAGE OF UTERUS N/A     Current Medications: No outpatient medications have been marked as taking for the 06/25/20 encounter (Appointment) with Jerline Pain, MD.     Allergies:   Penicillins, Codeine, Contrast media [iodinated diagnostic agents], and Other   Social History   Socioeconomic History  . Marital status: Married    Spouse name: Not on file  . Number of children: Not on file  . Years of education: Not on file  . Highest education level: Not on file  Occupational History  . Not on file  Tobacco Use  . Smoking status: Former Smoker    Packs/day: 1.00    Years: 5.00    Pack years: 5.00    Types: Cigarettes    Quit date: 05/26/1973    Years since quitting: 47.1  . Smokeless tobacco: Never Used  Vaping Use  . Vaping Use: Never used  Substance and Sexual Activity  . Alcohol use: Yes    Alcohol/week: 1.0 standard drink    Types: 1 Glasses of wine per week    Comment: 4 x's a wk a glass of wine  . Drug use: Never  . Sexual activity: Not on file  Other Topics Concern  . Not on file  Social History Narrative  . Not on file   Social Determinants of Health   Financial Resource Strain: Low Risk   . Difficulty of Paying Living Expenses: Not hard at all  Food Insecurity: No Food Insecurity  . Worried About Charity fundraiser in the Last Year: Never true  . Ran Out of Food in the Last Year: Never true   Transportation Needs: No Transportation Needs  . Lack of Transportation (Medical): No  . Lack of Transportation (Non-Medical): No  Physical Activity: Sufficiently Active  . Days of Exercise per Week: 4 days  . Minutes of Exercise per Session: 60 min  Stress: No Stress Concern Present  . Feeling of Stress : Not at all  Social Connections: Moderately Integrated  . Frequency of Communication with Friends and Family: More than three times a week  . Frequency of Social Gatherings with Friends and Family: More than three times a week  . Attends Religious Services: Never  . Active Member of Clubs or Organizations: Yes  . Attends Archivist Meetings: More than 4 times per year  . Marital Status: Married     Family History: The patient's ***family history includes Cancer in her father, mother, and sister; Heart disease in her father; Heart disease (age of onset: 83) in her mother; Stroke in her father and sister.  ROS:   Please see the history of present illness.    *** All other systems reviewed and are negative.  EKGs/Labs/Other Studies Reviewed:    The following studies were reviewed today: ***  EKG:  EKG is ***  ordered today.  The ekg ordered today demonstrates ***  Recent Labs: 02/05/2020: ALT 21; BUN 17; Creatinine, Ser 0.83; Hemoglobin 14.5; Platelets 276.0; Potassium 4.3; Sodium 139; TSH 1.47  Recent Lipid Panel    Component Value Date/Time   CHOL 182 02/05/2020 0810   CHOL 189 06/27/2019 0844   TRIG 68.0 02/05/2020 0810   HDL 56.30 02/05/2020 0810   HDL 63 06/27/2019 0844   CHOLHDL 3 02/05/2020 0810   VLDL 13.6 02/05/2020 0810   LDLCALC 112 (H) 02/05/2020 0810   LDLCALC 113 (H) 06/27/2019 0844   LDLDIRECT 142.6 03/04/2010 1001     Risk Assessment/Calculations:   {Does this patient have ATRIAL FIBRILLATION?:[id number]}   Physical Exam:    VS:  LMP  (LMP Unknown)     Wt Readings from Last 3 Encounters:  03/17/20 110 lb (49.9 kg)  01/28/20 110 lb 8  oz (50.1 kg)  11/26/19 115 lb (52.2 kg)     GEN: *** Well nourished, well developed in no acute distress HEENT: Normal NECK: No JVD; No carotid bruits LYMPHATICS: No lymphadenopathy CARDIAC: ***RRR, no murmurs, rubs, gallops RESPIRATORY:  Clear to auscultation without rales, wheezing or rhonchi  ABDOMEN: Soft, non-tender, non-distended MUSCULOSKELETAL:  No edema; No deformity  SKIN: Warm and dry NEUROLOGIC:  Alert and oriented x 3 PSYCHIATRIC:  Normal affect   ASSESSMENT:    No diagnosis found. PLAN:    In order of problems listed above:  1. ***   {Are you ordering a CV Procedure (e.g. stress test, cath, DCCV, TEE, etc)?   Press F2        :[id number]}    Medication Adjustments/Labs and Tests Ordered: Current medicines are reviewed at length with the patient today.  Concerns regarding medicines are outlined above.  No orders of the defined types were placed in this encounter.  No orders of the defined types were placed in this encounter.   There are no Patient Instructions on file for this visit.   Signed, Candee Furbish, MD  06/25/2020 9:27 AM    [facility]

## 2020-06-28 ENCOUNTER — Other Ambulatory Visit: Payer: Self-pay | Admitting: Cardiology

## 2020-06-30 NOTE — Telephone Encounter (Signed)
Pt's age 70, wt 49.9 kg, SCr 0.83, CrCl 49.68, last ov w/ MS 03/17/20.

## 2020-07-08 ENCOUNTER — Other Ambulatory Visit: Payer: Self-pay

## 2020-07-09 ENCOUNTER — Ambulatory Visit (INDEPENDENT_AMBULATORY_CARE_PROVIDER_SITE_OTHER): Payer: PPO | Admitting: Family Medicine

## 2020-07-09 ENCOUNTER — Encounter: Payer: Self-pay | Admitting: Family Medicine

## 2020-07-09 VITALS — BP 124/64 | HR 79 | Temp 98.0°F | Wt 122.3 lb

## 2020-07-09 DIAGNOSIS — H9202 Otalgia, left ear: Secondary | ICD-10-CM | POA: Diagnosis not present

## 2020-07-09 DIAGNOSIS — H6122 Impacted cerumen, left ear: Secondary | ICD-10-CM

## 2020-07-09 NOTE — Progress Notes (Addendum)
Established Patient Office Visit  Subjective:  Patient ID: Tammy [name], female    DOB: 07-Jun-1950  Age: 70 y.o. MRN: [id number]  CC:  Chief Complaint  Patient presents with  . Sinus Problem    Sinus pressure, L ear pain    HPI Tammy [name] presents for some sinus pressure and left ear discomfort.  She states about 2 weeks ago she had mild cold symptoms.  COVID testing then was negative x2.  She has some mild left frontal sinus pressure and occasional early morning headache.  No purulent drainage.  No bloody drainage.  Yesterday she noticed some left earache and fullness.  Denies any nasal congestion.  No cough.  No fevers or chills.  Overall does not feel poorly.  No sore throat.  Past Medical History:  Diagnosis Date  . Hyperlipidemia     Past Surgical History:  Procedure Laterality Date  . DILATION AND CURETTAGE OF UTERUS N/A     Family History  Problem Relation Age of Onset  . Heart disease Mother 53       CABG  . Cancer Mother        bladder cancer  . Heart disease Father        MVP, atrial fibrilation  . Cancer Father        colon cancer  . Stroke Father   . Cancer Sister        colon cancer  . Stroke Sister     Social History   Socioeconomic History  . Marital status: Married    Spouse name: Not on file  . Number of children: Not on file  . Years of education: Not on file  . Highest education level: Not on file  Occupational History  . Not on file  Tobacco Use  . Smoking status: Former Smoker    Packs/day: 1.00    Years: 5.00    Pack years: 5.00    Types: Cigarettes    Quit date: 05/26/1973    Years since quitting: 47.1  . Smokeless tobacco: Never Used  Vaping Use  . Vaping Use: Never used  Substance and Sexual Activity  . Alcohol use: Yes    Alcohol/week: 1.0 standard drink    Types: 1 Glasses of wine per week    Comment: 4 x's a wk a glass of wine  . Drug use: Never  . Sexual activity: Not on file  Other Topics Concern  .  Not on file  Social History Narrative  . Not on file   Social Determinants of Health   Financial Resource Strain: Low Risk   . Difficulty of Paying Living Expenses: Not hard at all  Food Insecurity: No Food Insecurity  . Worried About Charity fundraiser in the Last Year: Never true  . Ran Out of Food in the Last Year: Never true  Transportation Needs: No Transportation Needs  . Lack of Transportation (Medical): No  . Lack of Transportation (Non-Medical): No  Physical Activity: Sufficiently Active  . Days of Exercise per Week: 4 days  . Minutes of Exercise per Session: 60 min  Stress: No Stress Concern Present  . Feeling of Stress : Not at all  Social Connections: Moderately Integrated  . Frequency of Communication with Friends and Family: More than three times a week  . Frequency of Social Gatherings with Friends and Family: More than three times a week  . Attends Religious Services: Never  . Active Member of Clubs or Organizations:  Yes  . Attends Club or Organization Meetings: More than 4 times per year  . Marital Status: Married  Human resources officer Violence: Not At Risk  . Fear of Current or Ex-Partner: No  . Emotionally Abused: No  . Physically Abused: No  . Sexually Abused: No    Outpatient Medications Prior to Visit  Medication Sig Dispense Refill  . calcium-vitamin D 250-100 MG-UNIT tablet Take 2 tablets by mouth 2 (two) times daily.    . metoprolol tartrate (LOPRESSOR) 25 MG tablet TAKE 1 TABLET BY MOUTH AS NEEDED 30 tablet 11  . sodium fluoride (FLUORISHIELD) 1.1 % GEL dental gel PreviDent 5000 Booster Plus 1.1 % dental paste    . XARELTO 20 MG TABS tablet TAKE 1 TABLET(20 MG) BY MOUTH DAILY WITH SUPPER 90 tablet 1  . metoprolol succinate (TOPROL-XL) 50 MG 24 hr tablet Take 1 tablet (50 mg total) by mouth daily. Take with or immediately following a meal. (Patient not taking: Reported on 05/16/2020) 90 tablet 3   No facility-administered medications prior to visit.     Allergies  Allergen Reactions  . Penicillins Rash  . Codeine Other (See Comments)    Pt. States "crazy dreams"  . Contrast Media [Iodinated Diagnostic Agents] Itching    Eye Swelling  . Other Itching    Eye Swelling    ROS Review of Systems  Constitutional: Negative for chills and fever.  HENT: Negative for congestion and ear discharge.   Respiratory: Negative for cough and shortness of breath.   Neurological: Positive for headaches.      Objective:    Physical Exam Vitals reviewed.  Constitutional:      Appearance: Normal appearance.  HENT:     Ears:     Comments: She does have cerumen impaction left canal.  We were able to grasp cerumen with some forceps and remove this in entirety.  Eardrum appears normal.  Right canal is mostly clear Cardiovascular:     Rate and Rhythm: Normal rate and regular rhythm.  Pulmonary:     Effort: Pulmonary effort is normal.     Breath sounds: Normal breath sounds.  Neurological:     Mental Status: She is alert.     BP 124/64 (BP Location: Left Arm, Patient Position: Sitting, Cuff Size: Normal)   Pulse 79   Temp 98 F (36.7 C) (Oral)   Wt 122 lb 4.8 oz (55.5 kg)   LMP  (LMP Unknown)   SpO2 98%   BMI 20.35 kg/m  Wt Readings from Last 3 Encounters:  07/09/20 122 lb 4.8 oz (55.5 kg)  03/17/20 110 lb (49.9 kg)  01/28/20 110 lb 8 oz (50.1 kg)     There are no preventive care reminders to display for this patient.  There are no preventive care reminders to display for this patient.  Lab Results  Component Value Date   TSH 1.47 02/05/2020   Lab Results  Component Value Date   WBC 4.3 02/05/2020   HGB 14.5 02/05/2020   HCT 43.6 02/05/2020   MCV 95.7 02/05/2020   PLT 276.0 02/05/2020   Lab Results  Component Value Date   NA 139 02/05/2020   K 4.3 02/05/2020   CO2 29 02/05/2020   GLUCOSE 81 02/05/2020   BUN 17 02/05/2020   CREATININE 0.83 02/05/2020   BILITOT 0.6 02/05/2020   ALKPHOS 66 02/05/2020   AST 19  02/05/2020   ALT 21 02/05/2020   PROT 6.7 02/05/2020   ALBUMIN 4.1 02/05/2020  CALCIUM 9.3 02/05/2020   ANIONGAP 8 01/20/2016   GFR 71.62 02/05/2020   Lab Results  Component Value Date   CHOL 182 02/05/2020   Lab Results  Component Value Date   HDL 56.30 02/05/2020   Lab Results  Component Value Date   LDLCALC 112 (H) 02/05/2020   Lab Results  Component Value Date   TRIG 68.0 02/05/2020   Lab Results  Component Value Date   CHOLHDL 3 02/05/2020   No results found for: HGBA1C    Assessment & Plan:   Problem List Items Addressed This Visit   None   Visit Diagnoses    Acute otalgia, left    -  Primary    Patient had evidence for cerumen left canal which was removed without difficulty.  No evidence for otitis media.  May have mild left frontal sinusitis but at this point she would like to avoid antibiotics.  Follow-up for any purulent drainage or other concerns  We discussed low risk of cerumen removal with forceps including bleeding and perforation and pt consented.  She tolerated well.    No orders of the defined types were placed in this encounter.   Follow-up: No follow-ups on file.    Carolann Littler, MD

## 2020-07-09 NOTE — Patient Instructions (Signed)

## 2020-07-10 ENCOUNTER — Telehealth: Payer: Self-pay | Admitting: Nurse Practitioner

## 2020-07-10 NOTE — Telephone Encounter (Signed)
   Having rapid, irreg HRs throughout the day today.  Took metoprolol tartrate @ 15:45.  Rates have been trending 1-teens to 130's.  Other than noting palpitations, she doesn't feel poorly.  She has had afib before and also has a h/o SVT, though today feels more similar to prior episode of afib.  I rec that she may take additional metoprolol 25mg  prn q 4h as long as her SBP is > 5mmHg, which is currently is.  She is anticoagulated w/ Xarelto and has been compliant.  I advised that if she feels well tonight but is still in Afib in the AM, she is to call back when the office opens as we may be able to arrange DCCV through the ED.  Further, she mentioned that Dr. Marlou Porch has given her a rx for metoprolol succinate 50mg  daily.  She never started this.  I advised that if she converts to sinus tonight, she should start taking 1/2 tab daily (25mg  daily).  I will reach out to the office to arrange for early office f/u either way.  Caller verbalized understanding and was grateful for the call back.  Murray Hodgkins, NP 07/10/2020, 7:28 PM

## 2020-07-11 NOTE — Telephone Encounter (Signed)
Spoke with pt who reports she came out of At fib about 5:30-6 am.  She had been in At fib for appr 20 hours.  She took Metoprolol Tartrate 25 mg yesterday at 3:45 pm and again last night around 10:30 pm.  She had been playing tennis when she noticed she heart "quivering."    BP has been 63-335 systolic.  Denies dizziness.  Advised to make sure she is staying hydrated and to make sure to check her HR/BP should dizziness occur.  She questions starting the Metoprolol succinate 25 mg daily as instructed last pm by Ignacia Bayley, NP.  Advised OK to start this to help maintain NSR.  She is in agreement.  She will keep appt in At [facility] on Tuesday 9 am.  She is aware of parking code.  She will c/b prior to then if any questions or concerns.

## 2020-07-11 NOTE — Telephone Encounter (Signed)
Let's see if AFIB clinic can see her today (Friday) Thanks  Candee Furbish, MD

## 2020-07-11 NOTE — Telephone Encounter (Signed)
1st available appt in At Fib clinic Tuesday 9 am.   Called to speak with pt regarding this. Left message of Dr Marlou Porch request for her to be seen in At [facility].  Also requested her to c/b to discuss this appt and how she is feeling.  Advised she can also sent a message through MyChart if easier for her.

## 2020-07-14 ENCOUNTER — Other Ambulatory Visit: Payer: Self-pay | Admitting: *Deleted

## 2020-07-14 MED ORDER — METOPROLOL SUCCINATE ER 25 MG PO TB24: 25.0000 mg | ORAL_TABLET | Freq: Every day | ORAL | 3 refills | Status: DC

## 2020-07-14 NOTE — Progress Notes (Signed)
I advised that if she converts to sinus tonight, she should start taking 1/2 tab daily (25mg  daily).   pls see telephone note by Ignacia Bayley, NP for more information as needed.  Script to 25 mg tablets sent into pharmacy of choice as it is difficult for her to cut the 50 mg tablets into.

## 2020-07-14 NOTE — Progress Notes (Signed)
Primary Care Physician: Eulas Post, MD Primary Cardiologist: Dr Marlou Porch Primary Electrophysiologist: none Referring Physician: Dr Denny Levy Tammy [name] is a 70 y.o. female with a history of HLD, carotid artery stenosis, atrial fibrillation, atrial tachycardia who presents for follow up in the [facility]. Patient is on Xarelto for a CHADS2VASC score of 3. She called [facility] 07/10/20 with symptoms of tachypalpitations. She took PRN BB and converted to SR after ~20 hours. She then started daily metoprolol to help maintain SR. She reports that she feels well today. She was playing tennis before the onset of her symptoms and wonders if she was dehydrated. She denies any bleeding issues on anticoagulation.   Today, she denies symptoms of palpitations, chest pain, shortness of breath, orthopnea, PND, lower extremity edema, dizziness, presyncope, syncope, snoring, daytime somnolence, bleeding, or neurologic sequela. The patient is tolerating medications without difficulties and is otherwise without complaint today.    Atrial Fibrillation Risk Factors:  she does not have symptoms or diagnosis of sleep apnea. she does not have a history of rheumatic fever.   she has a BMI of Body mass index is 18.44 kg/m.Marland Kitchen Filed Weights   07/15/20 1002  Weight: 50.3 kg    Family History  Problem Relation Age of Onset  . Heart disease Mother 46       CABG  . Cancer Mother        bladder cancer  . Heart disease Father        MVP, atrial fibrilation  . Cancer Father        colon cancer  . Stroke Father   . Cancer Sister        colon cancer  . Stroke Sister      Atrial Fibrillation Management history:  Previous antiarrhythmic drugs: none Previous cardioversions: none Previous ablations: none CHADS2VASC score: 3 Anticoagulation history: Xarelto   Past Medical History:  Diagnosis Date  . Hyperlipidemia    Past Surgical History:  Procedure  Laterality Date  . DILATION AND CURETTAGE OF UTERUS N/A     Current Outpatient Medications  Medication Sig Dispense Refill  . metoprolol succinate (TOPROL-XL) 25 MG 24 hr tablet Take 1 tablet (25 mg total) by mouth daily. 90 tablet 3  . Multiple Vitamin (MULTIVITAMIN) tablet Algaecal- Taking 2 tablets by mouth in the am and 2 tablets at dinner    . sodium fluoride (FLUORISHIELD) 1.1 % GEL dental gel She is using prevident    . Strontium Chloride CRYS Taking 2 tablets by mouth at bedtime    . XARELTO 20 MG TABS tablet TAKE 1 TABLET(20 MG) BY MOUTH DAILY WITH SUPPER 90 tablet 1  . metoprolol tartrate (LOPRESSOR) 25 MG tablet TAKE 1 TABLET BY MOUTH AS NEEDED (Patient not taking: Reported on 07/15/2020) 30 tablet 11   No current facility-administered medications for this encounter.    Allergies  Allergen Reactions  . Penicillins Rash  . Codeine Other (See Comments)    Pt. States "crazy dreams"  . Contrast Media [Iodinated Diagnostic Agents] Itching    Eye Swelling  . Other Itching    Eye Swelling    Social History   Socioeconomic History  . Marital status: Married    Spouse name: Not on file  . Number of children: Not on file  . Years of education: Not on file  . Highest education level: Not on file  Occupational History  . Not on file  Tobacco Use  . Smoking  status: Former Smoker    Packs/day: 1.00    Years: 5.00    Pack years: 5.00    Types: Cigarettes    Quit date: 05/26/1973    Years since quitting: 47.1  . Smokeless tobacco: Never Used  Vaping Use  . Vaping Use: Never used  Substance and Sexual Activity  . Alcohol use: Yes    Alcohol/week: 1.0 standard drink    Types: 1 Glasses of wine per week    Comment: 4 x's a wk a glass of wine  . Drug use: Never  . Sexual activity: Not on file  Other Topics Concern  . Not on file  Social History Narrative  . Not on file   Social Determinants of Health   Financial Resource Strain: Low Risk   . Difficulty of Paying  Living Expenses: Not hard at all  Food Insecurity: No Food Insecurity  . Worried About Charity fundraiser in the Last Year: Never true  . Ran Out of Food in the Last Year: Never true  Transportation Needs: No Transportation Needs  . Lack of Transportation (Medical): No  . Lack of Transportation (Non-Medical): No  Physical Activity: Sufficiently Active  . Days of Exercise per Week: 4 days  . Minutes of Exercise per Session: 60 min  Stress: No Stress Concern Present  . Feeling of Stress : Not at all  Social Connections: Moderately Integrated  . Frequency of Communication with Friends and Family: More than three times a week  . Frequency of Social Gatherings with Friends and Family: More than three times a week  . Attends Religious Services: Never  . Active Member of Clubs or Organizations: Yes  . Attends Archivist Meetings: More than 4 times per year  . Marital Status: Married  Human resources officer Violence: Not At Risk  . Fear of Current or Ex-Partner: No  . Emotionally Abused: No  . Physically Abused: No  . Sexually Abused: No     ROS- All systems are reviewed and negative except as per the HPI above.  Physical Exam: Vitals:   07/15/20 1002  BP: 122/70  Pulse: 74  Weight: 50.3 kg  Height: 5\' 5"  (1.651 m)    GEN- The patient is a well appearing female, alert and oriented x 3 today.   Head- normocephalic, atraumatic Eyes-  Sclera clear, conjunctiva pink Ears- hearing intact Oropharynx- clear Neck- supple  Lungs- Clear to ausculation bilaterally, normal work of breathing Heart- Regular rate and rhythm, no murmurs, rubs or gallops  GI- soft, NT, ND, + BS Extremities- no clubbing, cyanosis, or edema MS- no significant deformity or atrophy Skin- no rash or lesion Psych- euthymic mood, full affect Neuro- strength and sensation are intact  Wt Readings from Last 3 Encounters:  07/15/20 50.3 kg  07/09/20 55.5 kg  03/17/20 49.9 kg    EKG today demonstrates   SR Vent. rate 74 BPM PR interval 138 ms QRS duration 88 ms QT/QTcB 388/430 ms  Echo 02/19/16 demonstrated  - Left ventricle: The cavity size was normal. Systolic function was  normal. The estimated ejection fraction was in the range of 55%  to 60%. Wall motion was normal; there were no regional wall  motion abnormalities. Left ventricular diastolic function  parameters were normal.  - Pericardium, extracardiac: A trivial pericardial effusion was  identified.   Epic records are reviewed at length today  CHA2DS2-VASc Score = 3  The patient's score is based upon: CHF History: No HTN History: No Diabetes  History: No Stroke History: No Vascular Disease History: Yes Age Score: 1 Gender Score: 1      ASSESSMENT AND PLAN: 1. Paroxysmal Atrial Fibrillation/atrial tach The patient's CHA2DS2-VASc score is 3, indicating a 3.2% annual risk of stroke.   Patient back in SR. Since this was her first afib episode in quite some time will hold on AAD for now. Will see how she does on scheduled BB. Continue Toprol 25 mg daily with Lopressor 25 mg q 6 hours for heart racing. Continue Xarelto 20 mg daily She may be a candidate for front-line ablation if she has frequent recurrence of her afib.   2. Secondary Hypercoagulable State (ICD10:  D68.69) The patient is at significant risk for stroke/thromboembolism based upon her CHA2DS2-VASc Score of 3.  Continue Rivaroxaban (Xarelto).    Follow up in the AF clinic in 4 months.    [facility] [address] [place], [place] [address] [phone] 07/15/2020 10:57 AM

## 2020-07-15 ENCOUNTER — Other Ambulatory Visit: Payer: Self-pay

## 2020-07-15 ENCOUNTER — Ambulatory Visit (HOSPITAL_COMMUNITY)
Admission: RE | Admit: 2020-07-15 | Discharge: 2020-07-15 | Disposition: A | Discharge status: Home / Self Care | Payer: PPO | Source: Ambulatory Visit | Attending: Nurse Practitioner | Admitting: Nurse Practitioner

## 2020-07-15 ENCOUNTER — Ambulatory Visit (HOSPITAL_COMMUNITY): Payer: PPO | Admitting: Nurse Practitioner

## 2020-07-15 VITALS — BP 122/70 | HR 74 | Ht 65.0 in | Wt 110.8 lb

## 2020-07-15 DIAGNOSIS — Z79899 Other long term (current) drug therapy: Secondary | ICD-10-CM | POA: Insufficient documentation

## 2020-07-15 DIAGNOSIS — Z885 Allergy status to narcotic agent status: Secondary | ICD-10-CM | POA: Insufficient documentation

## 2020-07-15 DIAGNOSIS — Z880 Allergy status to penicillin: Secondary | ICD-10-CM | POA: Insufficient documentation

## 2020-07-15 DIAGNOSIS — I480 Paroxysmal atrial fibrillation: Secondary | ICD-10-CM | POA: Insufficient documentation

## 2020-07-15 DIAGNOSIS — D6869 Other thrombophilia: Secondary | ICD-10-CM | POA: Diagnosis not present

## 2020-07-15 DIAGNOSIS — Z87891 Personal history of nicotine dependence: Secondary | ICD-10-CM | POA: Insufficient documentation

## 2020-07-15 DIAGNOSIS — Z7901 Long term (current) use of anticoagulants: Secondary | ICD-10-CM | POA: Diagnosis not present

## 2020-07-15 DIAGNOSIS — Z8249 Family history of ischemic heart disease and other diseases of the circulatory system: Secondary | ICD-10-CM | POA: Diagnosis not present

## 2020-07-17 DIAGNOSIS — Z20822 Contact with and (suspected) exposure to covid-19: Secondary | ICD-10-CM | POA: Diagnosis not present

## 2020-07-17 DIAGNOSIS — R509 Fever, unspecified: Secondary | ICD-10-CM | POA: Diagnosis not present

## 2020-07-17 DIAGNOSIS — J069 Acute upper respiratory infection, unspecified: Secondary | ICD-10-CM | POA: Diagnosis not present

## 2020-07-22 ENCOUNTER — Telehealth (INDEPENDENT_AMBULATORY_CARE_PROVIDER_SITE_OTHER): Payer: PPO | Admitting: Family Medicine

## 2020-07-22 ENCOUNTER — Other Ambulatory Visit: Payer: Self-pay

## 2020-07-22 ENCOUNTER — Telehealth (HOSPITAL_COMMUNITY): Payer: Self-pay | Admitting: *Deleted

## 2020-07-22 DIAGNOSIS — R509 Fever, unspecified: Secondary | ICD-10-CM

## 2020-07-22 MED ORDER — METOPROLOL SUCCINATE ER 25 MG PO TB24: 12.5000 mg | ORAL_TABLET | Freq: Every day | ORAL | 3 refills | Status: DC

## 2020-07-22 NOTE — Progress Notes (Signed)
Patient ID: Tammy [name], female   DOB: Jul 12, 1950, 70 y.o.   MRN: [id number]   This visit type was conducted due to national recommendations for restrictions regarding the COVID-19 pandemic in an effort to limit this patient's exposure and mitigate transmission in our community.   Virtual Visit via Video Note  I connected with Tammy [name] on 07/22/20 at 10:00 AM EDT by a video enabled telemedicine application and verified that I am speaking with the correct person using two identifiers.  Location patient: home Location provider:work or home office Persons participating in the virtual visit: patient, provider  I discussed the limitations of evaluation and management by telemedicine and the availability of in person appointments. The patient expressed understanding and agreed to proceed.   HPI: Tammy [name] called with possible low-grade fever since last Wednesday.  She had temperature 100.4 he has had various readings intermittently since then between 99 and low 100 range.  She has taken Tylenol which seems to bring her temperature down.  Sometimes can go up to 15 hours without fever.  She has had some occasional chills initially and very mild aches but really no other major symptoms.  She has occasional postnasal drainage which is more or less chronic.  No facial pain.  No recent tick bites.  No cough.  No rash.  No urinary symptoms.  No abdominal pain.  She did 2 home COVID test which were both negative.  She then went to urgent care last Thursday and had flu test which was negative along with negative rapid COVID test and PCR which came back Saturday negative.  No sick contacts.  Temperature was normal this morning.   ROS: See pertinent positives and negatives per HPI.  Past Medical History:  Diagnosis Date  . Hyperlipidemia     Past Surgical History:  Procedure Laterality Date  . DILATION AND CURETTAGE OF UTERUS N/A     Family History  Problem Relation Age of Onset  . Heart  disease Mother 57       CABG  . Cancer Mother        bladder cancer  . Heart disease Father        MVP, atrial fibrilation  . Cancer Father        colon cancer  . Stroke Father   . Cancer Sister        colon cancer  . Stroke Sister     SOCIAL HX: Non-smoker   Current Outpatient Medications:  .  metoprolol succinate (TOPROL-XL) 25 MG 24 hr tablet, Take 1 tablet (25 mg total) by mouth daily., Disp: 90 tablet, Rfl: 3 .  metoprolol tartrate (LOPRESSOR) 25 MG tablet, TAKE 1 TABLET BY MOUTH AS NEEDED (Patient not taking: Reported on 07/15/2020), Disp: 30 tablet, Rfl: 11 .  Multiple Vitamin (MULTIVITAMIN) tablet, Algaecal- Taking 2 tablets by mouth in the am and 2 tablets at dinner, Disp: , Rfl:  .  sodium fluoride (FLUORISHIELD) 1.1 % GEL dental gel, She is using prevident, Disp: , Rfl:  .  Strontium Chloride CRYS, Taking 2 tablets by mouth at bedtime, Disp: , Rfl:  .  XARELTO 20 MG TABS tablet, TAKE 1 TABLET(20 MG) BY MOUTH DAILY WITH SUPPER, Disp: 90 tablet, Rfl: 1  EXAM:  VITALS per patient if applicable:  GENERAL: alert, oriented, appears well and in no acute distress  HEENT: atraumatic, conjunttiva clear, no obvious abnormalities on inspection of external nose and ears  NECK: normal movements of the head and neck  LUNGS:  on inspection no signs of respiratory distress, breathing rate appears normal, no obvious gross SOB, gasping or wheezing  CV: no obvious cyanosis  MS: moves all visible extremities without noticeable abnormality  PSYCH/NEURO: pleasant and cooperative, no obvious depression or anxiety, speech and thought processing grossly intact  ASSESSMENT AND PLAN:  Discussed the following assessment and plan:  Possible low-grade fever past 5 days.  She has had multiple COVID test which have all been negative.  She has not appear ill acutely and temperature elevation has been very minimal with no other real obvious symptoms.  -We discussed the fact that fever does not  always equate to infection.  Given multiple negative COVID tests, COVID seems to be less likely.  We have asked that she watch closely for any new symptoms such as headache, increased arthralgias/myalgia, skin rash, urinary symptoms, cough, etc. -If she develops recurrent fever will recommend in office follow-up to test further with some basic labs for screening including urinalysis     I discussed the assessment and treatment plan with the patient. The patient was provided an opportunity to ask questions and all were answered. The patient agreed with the plan and demonstrated an understanding of the instructions.   The patient was advised to call back or seek an in-person evaluation if the symptoms worsen or if the condition fails to improve as anticipated.     Carolann Littler, MD

## 2020-07-22 NOTE — Telephone Encounter (Signed)
Patient called in with complaints of fatigue after starting metoprolol succinate. Pt also since that time has been running a fever of unknown origin therefore she is not sure if that is contributing or not. The fever is being worked up by her primary care. BP at home according to her cuff are running 80-90/50s HR 61-72. Discussed with Adline Peals PA will try decreasing metoprolol to 12.5mg  once a day if symptoms persist once over acute illness and reduction the patient will notify our office.

## 2020-07-25 ENCOUNTER — Other Ambulatory Visit: Payer: Self-pay

## 2020-07-25 ENCOUNTER — Ambulatory Visit (INDEPENDENT_AMBULATORY_CARE_PROVIDER_SITE_OTHER): Payer: PPO | Admitting: Family Medicine

## 2020-07-25 ENCOUNTER — Encounter: Payer: Self-pay | Admitting: Family Medicine

## 2020-07-25 VITALS — BP 118/60 | HR 76 | Temp 98.0°F | Wt 112.3 lb

## 2020-07-25 DIAGNOSIS — R509 Fever, unspecified: Secondary | ICD-10-CM | POA: Diagnosis not present

## 2020-07-25 DIAGNOSIS — J029 Acute pharyngitis, unspecified: Secondary | ICD-10-CM

## 2020-07-25 LAB — POCT URINALYSIS DIPSTICK
Bilirubin, UA: NEGATIVE
Blood, UA: NEGATIVE
Glucose, UA: NEGATIVE
Ketones, UA: NEGATIVE
Leukocytes, UA: NEGATIVE
Nitrite, UA: NEGATIVE
Protein, UA: NEGATIVE
Spec Grav, UA: 1.01 (ref 1.010–1.025)
Urobilinogen, UA: 0.2 E.U./dL
pH, UA: 6 (ref 5.0–8.0)

## 2020-07-25 LAB — CBC WITH DIFFERENTIAL/PLATELET
Basophils Absolute: 0.1 10*3/uL (ref 0.0–0.1)
Basophils Relative: 0.6 % (ref 0.0–3.0)
Eosinophils Absolute: 0.1 10*3/uL (ref 0.0–0.7)
Eosinophils Relative: 0.7 % (ref 0.0–5.0)
HCT: 41.1 % (ref 36.0–46.0)
Hemoglobin: 13.8 g/dL (ref 12.0–15.0)
Lymphocytes Relative: 64.3 % — ABNORMAL HIGH (ref 12.0–46.0)
Lymphs Abs: 8.3 10*3/uL — ABNORMAL HIGH (ref 0.7–4.0)
MCHC: 33.6 g/dL (ref 30.0–36.0)
MCV: 93.4 fl (ref 78.0–100.0)
Monocytes Absolute: 0.9 10*3/uL (ref 0.1–1.0)
Monocytes Relative: 7 % (ref 3.0–12.0)
Neutro Abs: 3.5 10*3/uL (ref 1.4–7.7)
Neutrophils Relative %: 27.4 % — ABNORMAL LOW (ref 43.0–77.0)
Platelets: 289 10*3/uL (ref 150.0–400.0)
RBC: 4.4 Mil/uL (ref 3.87–5.11)
RDW: 12.5 % (ref 11.5–15.5)
WBC: 12.9 10*3/uL — ABNORMAL HIGH (ref 4.0–10.5)

## 2020-07-25 LAB — POCT RAPID STREP A (OFFICE): Rapid Strep A Screen: NEGATIVE

## 2020-07-25 NOTE — Progress Notes (Signed)
Established Patient Office Visit  Subjective:  Patient ID: Tammy [name], female    DOB: [birth year]  Age: 70 y.o. MRN: [id number]  CC:  Chief Complaint  Patient presents with  . Follow-up    HPI Tammy [name] presents for several day history of low-grade fever.  Onset was 5-25 with temperature 100.9.  Refer to previous virtual visit for details.  Her temperature has fluctuated between 99 and low 100 range since then although she denies any today.  She states that overall she feels better today.  With recent urgent care visit she was told that her throat was very red but there was no strep culture done or rapid strep testing.  She does have some minimal sore throat but this is very minimal.  No cough.  No significant arthralgias.  No sick contacts.  No headaches.  No chronic sinusitis symptoms.  No dysuria.  No tick bites.  No rash.  Past Medical History:  Diagnosis Date  . Hyperlipidemia     Past Surgical History:  Procedure Laterality Date  . DILATION AND CURETTAGE OF UTERUS N/A     Family History  Problem Relation Age of Onset  . Heart disease Mother 24       CABG  . Cancer Mother        bladder cancer  . Heart disease Father        MVP, atrial fibrilation  . Cancer Father        colon cancer  . Stroke Father   . Cancer Sister        colon cancer  . Stroke Sister     Social History   Socioeconomic History  . Marital status: Married    Spouse name: Not on file  . Number of children: Not on file  . Years of education: Not on file  . Highest education level: Not on file  Occupational History  . Not on file  Tobacco Use  . Smoking status: Former Smoker    Packs/day: 1.00    Years: 5.00    Pack years: 5.00    Types: Cigarettes    Quit date: 05/26/1973    Years since quitting: 47.1  . Smokeless tobacco: Never Used  Vaping Use  . Vaping Use: Never used  Substance and Sexual Activity  . Alcohol use: Yes    Alcohol/week: 1.0 standard drink     Types: 1 Glasses of wine per week    Comment: 4 x's a wk a glass of wine  . Drug use: Never  . Sexual activity: Not on file  Other Topics Concern  . Not on file  Social History Narrative  . Not on file   Social Determinants of Health   Financial Resource Strain: Low Risk   . Difficulty of Paying Living Expenses: Not hard at all  Food Insecurity: No Food Insecurity  . Worried About Charity fundraiser in the Last Year: Never true  . Ran Out of Food in the Last Year: Never true  Transportation Needs: No Transportation Needs  . Lack of Transportation (Medical): No  . Lack of Transportation (Non-Medical): No  Physical Activity: Sufficiently Active  . Days of Exercise per Week: 4 days  . Minutes of Exercise per Session: 60 min  Stress: No Stress Concern Present  . Feeling of Stress : Not at all  Social Connections: Moderately Integrated  . Frequency of Communication with Friends and Family: More than three times a week  . Frequency  of Social Gatherings with Friends and Family: More than three times a week  . Attends Religious Services: Never  . Active Member of Clubs or Organizations: Yes  . Attends Archivist Meetings: More than 4 times per year  . Marital Status: Married  Human resources officer Violence: Not At Risk  . Fear of Current or Ex-Partner: No  . Emotionally Abused: No  . Physically Abused: No  . Sexually Abused: No    Outpatient Medications Prior to Visit  Medication Sig Dispense Refill  . metoprolol succinate (TOPROL-XL) 25 MG 24 hr tablet Take 0.5 tablets (12.5 mg total) by mouth at bedtime. 90 tablet 3  . metoprolol tartrate (LOPRESSOR) 25 MG tablet TAKE 1 TABLET BY MOUTH AS NEEDED 30 tablet 11  . Multiple Vitamin (MULTIVITAMIN) tablet Algaecal- Taking 2 tablets by mouth in the am and 2 tablets at dinner    . sodium fluoride (FLUORISHIELD) 1.1 % GEL dental gel She is using prevident    . Strontium Chloride CRYS Taking 2 tablets by mouth at bedtime    .  XARELTO 20 MG TABS tablet TAKE 1 TABLET(20 MG) BY MOUTH DAILY WITH SUPPER 90 tablet 1   No facility-administered medications prior to visit.    Allergies  Allergen Reactions  . Penicillins Rash  . Codeine Other (See Comments)    Pt. States "crazy dreams"  . Contrast Media [Iodinated Diagnostic Agents] Itching    Eye Swelling  . Other Itching    Eye Swelling    ROS Review of Systems  Constitutional: Positive for fatigue and fever.  Respiratory: Negative for cough and shortness of breath.   Cardiovascular: Negative for chest pain.  Gastrointestinal: Negative for abdominal pain, nausea and vomiting.  Genitourinary: Negative for dysuria.  Neurological: Negative for headaches.  Hematological: Negative for adenopathy.      Objective:    Physical Exam Vitals reviewed.  HENT:     Right Ear: Ear canal normal.     Left Ear: Ear canal normal.     Mouth/Throat:     Comments: Oropharynx reveals some mild to moderate erythema but no exudate. Cardiovascular:     Rate and Rhythm: Normal rate and regular rhythm.  Pulmonary:     Effort: Pulmonary effort is normal.     Breath sounds: Normal breath sounds.  Musculoskeletal:     Cervical back: Neck supple.  Lymphadenopathy:     Cervical: No cervical adenopathy.  Neurological:     Mental Status: She is alert.     BP 118/60 (BP Location: Left Arm, Patient Position: Sitting, Cuff Size: Normal)   Pulse 76   Temp 98 F (36.7 C) (Oral)   Wt 112 lb 4.8 oz (50.9 kg)   LMP  (LMP Unknown)   SpO2 96%   BMI 18.69 kg/m  Wt Readings from Last 3 Encounters:  07/25/20 112 lb 4.8 oz (50.9 kg)  07/15/20 110 lb 12.8 oz (50.3 kg)  07/09/20 122 lb 4.8 oz (55.5 kg)     Health Maintenance Due  Topic Date Due  . Pneumococcal Vaccine 58-71 Years old (1 of 4 - PCV13) Never done  . Zoster Vaccines- Shingrix (1 of 2) Never done    There are no preventive care reminders to display for this patient.  Lab Results  Component Value Date   TSH  1.47 02/05/2020   Lab Results  Component Value Date   WBC 4.3 02/05/2020   HGB 14.5 02/05/2020   HCT 43.6 02/05/2020   MCV 95.7 02/05/2020  PLT 276.0 02/05/2020   Lab Results  Component Value Date   NA 139 02/05/2020   K 4.3 02/05/2020   CO2 29 02/05/2020   GLUCOSE 81 02/05/2020   BUN 17 02/05/2020   CREATININE 0.83 02/05/2020   BILITOT 0.6 02/05/2020   ALKPHOS 66 02/05/2020   AST 19 02/05/2020   ALT 21 02/05/2020   PROT 6.7 02/05/2020   ALBUMIN 4.1 02/05/2020   CALCIUM 9.3 02/05/2020   ANIONGAP 8 01/20/2016   GFR 71.62 02/05/2020   Lab Results  Component Value Date   CHOL 182 02/05/2020   Lab Results  Component Value Date   HDL 56.30 02/05/2020   Lab Results  Component Value Date   LDLCALC 112 (H) 02/05/2020   Lab Results  Component Value Date   TRIG 68.0 02/05/2020   Lab Results  Component Value Date   CHOLHDL 3 02/05/2020   No results found for: HGBA1C    Assessment & Plan:   Approximately 8-day history of low-grade temperature elevation.  Currently afebrile.  Nontoxic in appearance.  She had some mild sore throat with rapid strep testing today negative.  Low clinical suspicion for strep pharyngitis.  This may all be post viral.  We did discuss other potential etiologies including autoimmune, infectious, and other etiologies.  She does not have clinical symptoms to suggest polymyalgia rheumatica.  No recent rash, myalgia, or headache to suggest likely tick fever.  -Check some basic labs with urine dipstick, CBC with differential, C-reactive protein. -Continue to monitor temperature closely and follow-up for any persistent fever or any new symptoms -If labs above reassuring recommend observation for now unless her fever persists.  No orders of the defined types were placed in this encounter.   Follow-up: No follow-ups on file.    Carolann Littler, MD

## 2020-07-25 NOTE — Patient Instructions (Signed)
Continue to monitor for any fever or new symptoms- eg rash, cough, myalgia.

## 2020-07-28 ENCOUNTER — Encounter: Payer: Self-pay | Admitting: Family Medicine

## 2020-07-28 LAB — C-REACTIVE PROTEIN: CRP: <1 mg/dL (ref 0.5–20.0)

## 2020-09-24 ENCOUNTER — Ambulatory Visit: Payer: PPO

## 2020-10-20 DIAGNOSIS — R1031 Right lower quadrant pain: Secondary | ICD-10-CM | POA: Diagnosis not present

## 2020-10-20 DIAGNOSIS — Z01419 Encounter for gynecological examination (general) (routine) without abnormal findings: Secondary | ICD-10-CM | POA: Diagnosis not present

## 2020-10-20 DIAGNOSIS — N898 Other specified noninflammatory disorders of vagina: Secondary | ICD-10-CM | POA: Diagnosis not present

## 2020-10-28 DIAGNOSIS — R1031 Right lower quadrant pain: Secondary | ICD-10-CM | POA: Diagnosis not present

## 2020-11-07 DIAGNOSIS — L570 Actinic keratosis: Secondary | ICD-10-CM | POA: Diagnosis not present

## 2020-11-07 DIAGNOSIS — D2272 Melanocytic nevi of left lower limb, including hip: Secondary | ICD-10-CM | POA: Diagnosis not present

## 2020-11-07 DIAGNOSIS — L821 Other seborrheic keratosis: Secondary | ICD-10-CM | POA: Diagnosis not present

## 2020-11-07 DIAGNOSIS — D225 Melanocytic nevi of trunk: Secondary | ICD-10-CM | POA: Diagnosis not present

## 2020-11-07 DIAGNOSIS — L578 Other skin changes due to chronic exposure to nonionizing radiation: Secondary | ICD-10-CM | POA: Diagnosis not present

## 2020-11-07 DIAGNOSIS — Z86018 Personal history of other benign neoplasm: Secondary | ICD-10-CM | POA: Diagnosis not present

## 2020-11-17 ENCOUNTER — Encounter (HOSPITAL_COMMUNITY): Payer: Self-pay | Admitting: Physician Assistant

## 2020-11-17 ENCOUNTER — Other Ambulatory Visit: Payer: Self-pay

## 2020-11-17 ENCOUNTER — Ambulatory Visit (HOSPITAL_COMMUNITY)
Admission: RE | Admit: 2020-11-17 | Discharge: 2020-11-17 | Disposition: A | Discharge status: Home / Self Care | Payer: PPO | Source: Ambulatory Visit | Attending: Physician Assistant | Admitting: Physician Assistant

## 2020-11-17 VITALS — BP 120/70 | HR 66 | Ht 65.0 in | Wt 111.2 lb

## 2020-11-17 DIAGNOSIS — E785 Hyperlipidemia, unspecified: Secondary | ICD-10-CM | POA: Diagnosis not present

## 2020-11-17 DIAGNOSIS — I471 Supraventricular tachycardia: Secondary | ICD-10-CM | POA: Insufficient documentation

## 2020-11-17 DIAGNOSIS — Z880 Allergy status to penicillin: Secondary | ICD-10-CM | POA: Diagnosis not present

## 2020-11-17 DIAGNOSIS — I6529 Occlusion and stenosis of unspecified carotid artery: Secondary | ICD-10-CM | POA: Diagnosis not present

## 2020-11-17 DIAGNOSIS — Z79899 Other long term (current) drug therapy: Secondary | ICD-10-CM | POA: Insufficient documentation

## 2020-11-17 DIAGNOSIS — I480 Paroxysmal atrial fibrillation: Secondary | ICD-10-CM | POA: Insufficient documentation

## 2020-11-17 DIAGNOSIS — D6869 Other thrombophilia: Secondary | ICD-10-CM | POA: Diagnosis not present

## 2020-11-17 DIAGNOSIS — Z8249 Family history of ischemic heart disease and other diseases of the circulatory system: Secondary | ICD-10-CM | POA: Insufficient documentation

## 2020-11-17 DIAGNOSIS — Z87891 Personal history of nicotine dependence: Secondary | ICD-10-CM | POA: Diagnosis not present

## 2020-11-17 DIAGNOSIS — Z7901 Long term (current) use of anticoagulants: Secondary | ICD-10-CM | POA: Diagnosis not present

## 2020-11-17 NOTE — Progress Notes (Signed)
Primary Care Physician: Eulas Post, MD Primary Cardiologist: Dr Marlou Porch Primary Electrophysiologist: none Referring Physician: Dr Denny Levy Tammy [name] is a 70 y.o. female with a history of HLD, carotid artery stenosis, atrial fibrillation, atrial tachycardia who presents for follow up in the [facility]. Patient is on Xarelto for a CHADS2VASC score of 3. [facility] 07[facility]10[facility]20 with symptoms of tachypalpitations. She took PRN BB and converted to SR after ~20 hours. She then started daily metoprolol to help maintain SR.   On follow up today, patient reports she has done well since her last visit. She is tolerating the lower dose of BB. She denies any heart racing or palpitations. She denies any bleeding issues on anticoagulation.   Today, she denies symptoms of palpitations, chest pain, shortness of breath, orthopnea, PND, lower extremity edema, dizziness, presyncope, syncope, snoring, daytime somnolence, bleeding, or neurologic sequela. The patient is tolerating medications without difficulties and is otherwise without complaint today.    Atrial Fibrillation Risk Factors:  she does not have symptoms or diagnosis of sleep apnea. she does not have a history of rheumatic fever.   she has a BMI of Body mass index is 18.5 kg/m.Marland Kitchen Filed Weights   11/17/20 1008  Weight: 50.4 kg     Family History  Problem Relation Age of Onset   Heart disease Mother 68       CABG   Cancer Mother        bladder cancer   Heart disease Father        MVP, atrial fibrilation   Cancer Father        colon cancer   Stroke Father    Cancer Sister        colon cancer   Stroke Sister      Atrial Fibrillation Management history:  Previous antiarrhythmic drugs: none Previous cardioversions: none Previous ablations: none CHADS2VASC score: 3 Anticoagulation history: Xarelto   Past Medical History:  Diagnosis Date   Hyperlipidemia    Past Surgical  History:  Procedure Laterality Date   DILATION AND CURETTAGE OF UTERUS N/A     Current Outpatient Medications  Medication Sig Dispense Refill   metoprolol succinate (TOPROL-XL) 25 MG 24 hr tablet Take 0.5 tablets (12.5 mg total) by mouth at bedtime. 90 tablet 3   metoprolol tartrate (LOPRESSOR) 25 MG tablet TAKE 1 TABLET BY MOUTH AS NEEDED 30 tablet 11   Multiple Vitamin (MULTIVITAMIN) tablet Algaecal- Taking 2 tablets by mouth in the am and 2 tablets at dinner     PREVIDENT 5000 BOOSTER PLUS 1.1 % PSTE Place onto teeth.     Strontium Chloride CRYS Taking 2 tablets by mouth at bedtime     XARELTO 20 MG TABS tablet TAKE 1 TABLET(20 MG) BY MOUTH DAILY WITH SUPPER 90 tablet 1   No current facility-administered medications for this encounter.    Allergies  Allergen Reactions   Penicillins Rash   Codeine Other (See Comments)    Pt. States "crazy dreams"   Contrast Media [Iodinated Diagnostic Agents] Itching    Eye Swelling    Social History   Socioeconomic History   Marital status: Married    Spouse name: Not on file   Number of children: Not on file   Years of education: Not on file   Highest education level: Not on file  Occupational History   Not on file  Tobacco Use   Smoking status: Former    Packs/day: 1.00  Years: 5.00    Pack years: 5.00    Types: Cigarettes    Quit date: 05/26/1973    Years since quitting: 47.5   Smokeless tobacco: Never   Tobacco comments:    Former smoker 11/17/2020  Vaping Use   Vaping Use: Never used  Substance and Sexual Activity   Alcohol use: Yes    Alcohol/week: 1.0 standard drink    Types: 1 Glasses of wine per week    Comment: 4 x's a wk a glass of wine   Drug use: Never   Sexual activity: Not on file  Other Topics Concern   Not on file  Social History Narrative   Not on file   Social Determinants of Health   Financial Resource Strain: Low Risk    Difficulty of Paying Living Expenses: Not hard at all  Food Insecurity: No  Food Insecurity   Worried About Charity fundraiser in the Last Year: Never true   Ran Out of Food in the Last Year: Never true  Transportation Needs: No Transportation Needs   Lack of Transportation (Medical): No   Lack of Transportation (Non-Medical): No  Physical Activity: Sufficiently Active   Days of Exercise per Week: 4 days   Minutes of Exercise per Session: 60 min  Stress: No Stress Concern Present   Feeling of Stress : Not at all  Social Connections: Moderately Integrated   Frequency of Communication with Friends and Family: More than three times a week   Frequency of Social Gatherings with Friends and Family: More than three times a week   Attends Religious Services: Never   Marine scientist or Organizations: Yes   Attends Music therapist: More than 4 times per year   Marital Status: Married  Human resources officer Violence: Not At Risk   Fear of Current or Ex-Partner: No   Emotionally Abused: No   Physically Abused: No   Sexually Abused: No     ROS- All systems are reviewed and negative except as per the HPI above.  Physical Exam: Vitals:   11/17/20 1008  BP: 120/70  Pulse: 66  Weight: 50.4 kg  Height: 5\' 5"  (1.651 m)    GEN- The patient is a well appearing female, alert and oriented x 3 today.   HEENT-head normocephalic, atraumatic, sclera clear, conjunctiva pink, hearing intact, trachea midline. Lungs- Clear to ausculation bilaterally, normal work of breathing Heart- Regular rate and rhythm, no murmurs, rubs or gallops  GI- soft, NT, ND, + BS Extremities- no clubbing, cyanosis, or edema MS- no significant deformity or atrophy Skin- no rash or lesion Psych- euthymic mood, full affect Neuro- strength and sensation are intact   Wt Readings from Last 3 Encounters:  11/17/20 50.4 kg  07/25/20 50.9 kg  07/15/20 50.3 kg    EKG today demonstrates  SR Vent. rate 66 BPM PR interval 138 ms QRS duration 86 ms QT/QTcB 414/434 ms  Echo  02/19/16 demonstrated  - Left ventricle: The cavity size was normal. Systolic function was    normal. The estimated ejection fraction was in the range of 55%    to 60%. Wall motion was normal; there were no regional wall    motion abnormalities. Left ventricular diastolic function    parameters were normal.  - Pericardium, extracardiac: A trivial pericardial effusion was    identified.   Epic records are reviewed at length today  CHA2DS2-VASc Score = 3  The patient's score is based upon: CHF History: 0 HTN  History: 0 Diabetes History: 0 Stroke History: 0 Vascular Disease History: 1 Age Score: 1 Gender Score: 1      ASSESSMENT AND PLAN: 1. Paroxysmal Atrial Fibrillation/atrial tach The patient's CHA2DS2-VASc score is 3, indicating a 3.2% annual risk of stroke.   Patient appears to be maintaining SR. Continue Toprol 12.5 mg daily with Lopressor 25 mg q 6 hours for heart racing. Continue Xarelto 20 mg daily She may be a candidate for front-line ablation if she has frequent recurrence of her afib.   2. Secondary Hypercoagulable State (ICD10:  D68.69) The patient is at significant risk for stroke/thromboembolism based upon her CHA2DS2-VASc Score of 3.  Continue Rivaroxaban (Xarelto).    Follow up with Dr Marlou Porch for annual visit. AF clinic in one year.    [facility] [address] [place], [place] [address] [phone] 11/17/2020 10:32 AM

## 2020-11-29 IMAGING — MG DIGITAL SCREENING BILAT W/ TOMO W/ CAD
6 of 10 series · 6 of 30 positions shown · non-contrast
Comparison: Previous exam(s).

CLINICAL DATA: Screening.

EXAM:
DIGITAL SCREENING BILATERAL MAMMOGRAM WITH TOMO AND CAD

[L CC synth-2D]
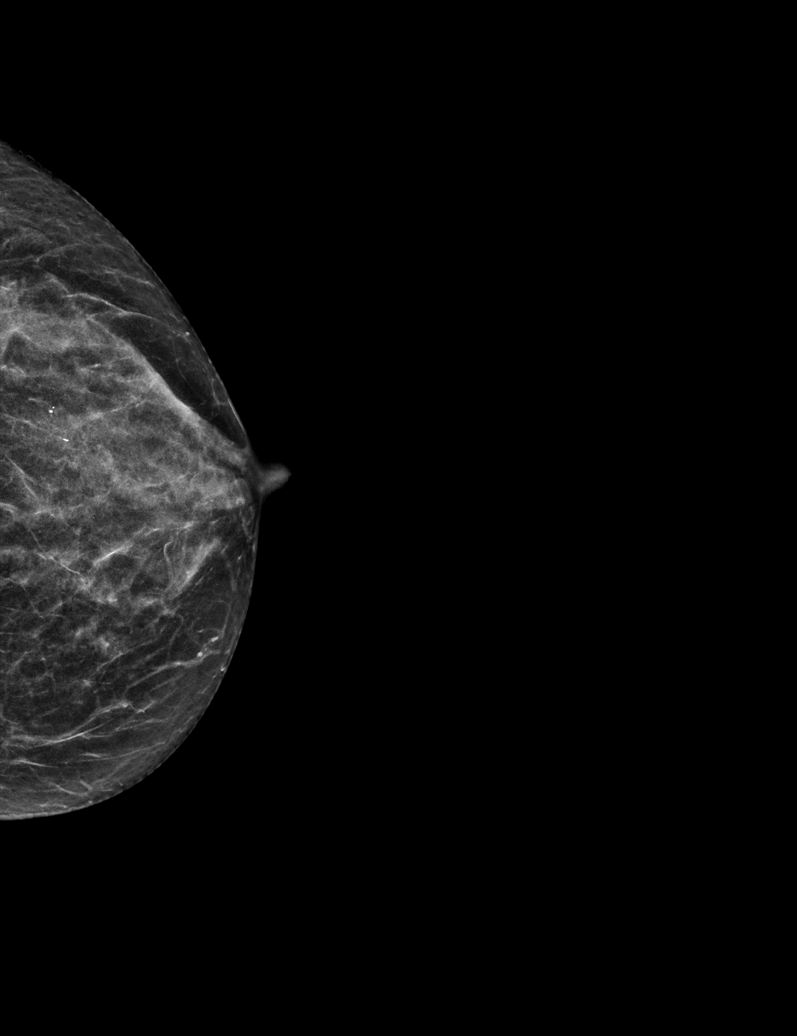

[L MLO synth-2D]
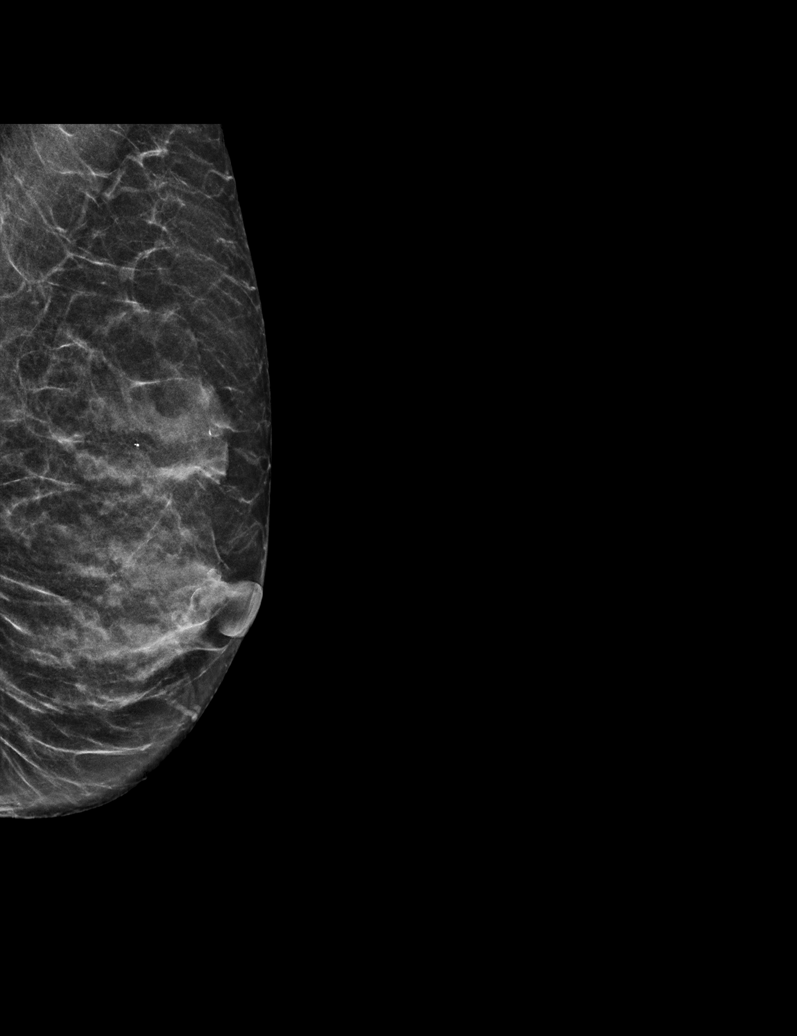

[R MLO synth-2D (1 of 2)]
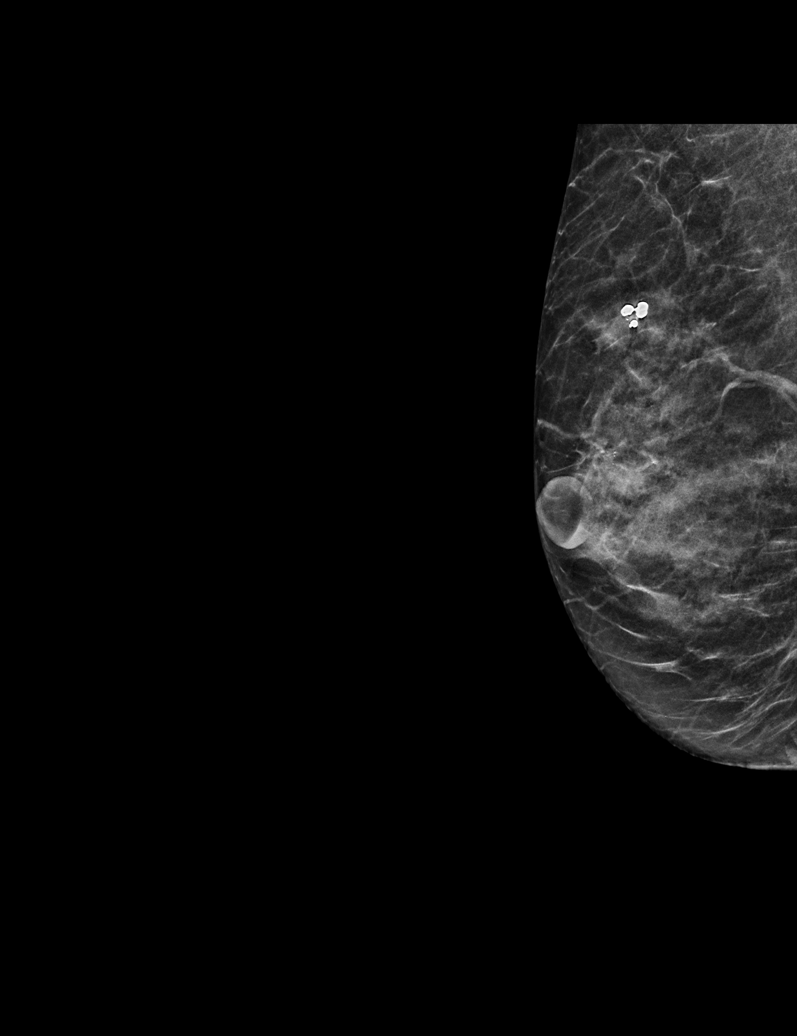

[R CC synth-2D]
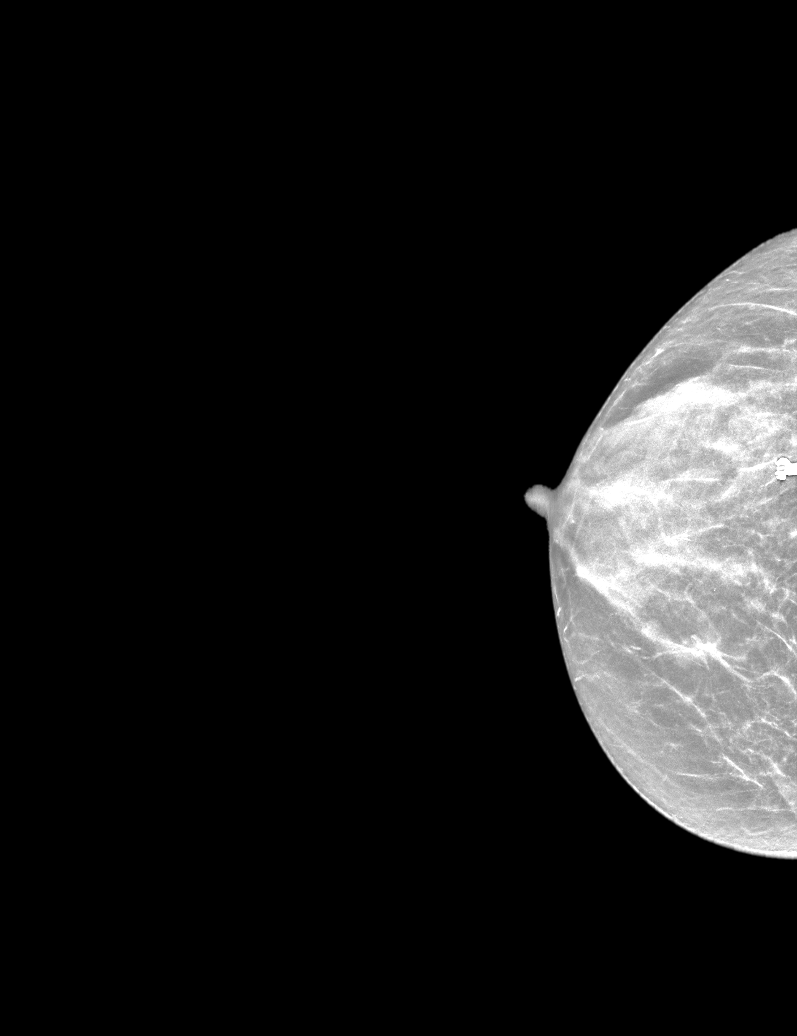

[R MLO synth-2D (2 of 2)]
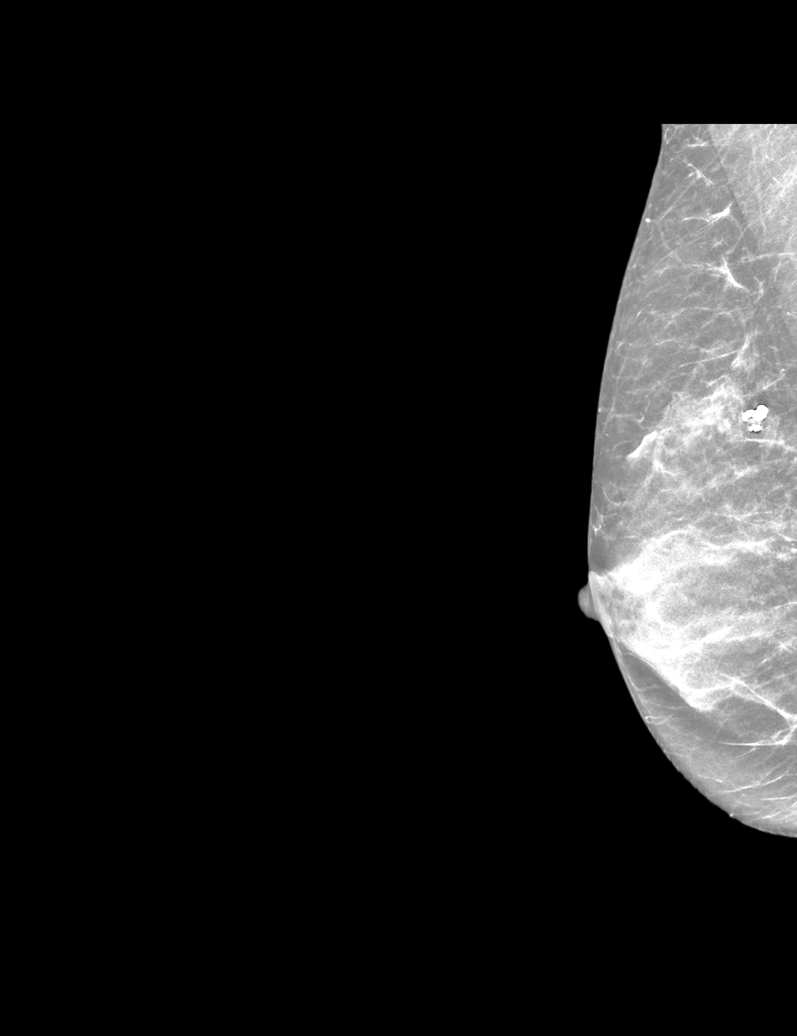

[R MLO tomo · tomo slice 19/38.0]
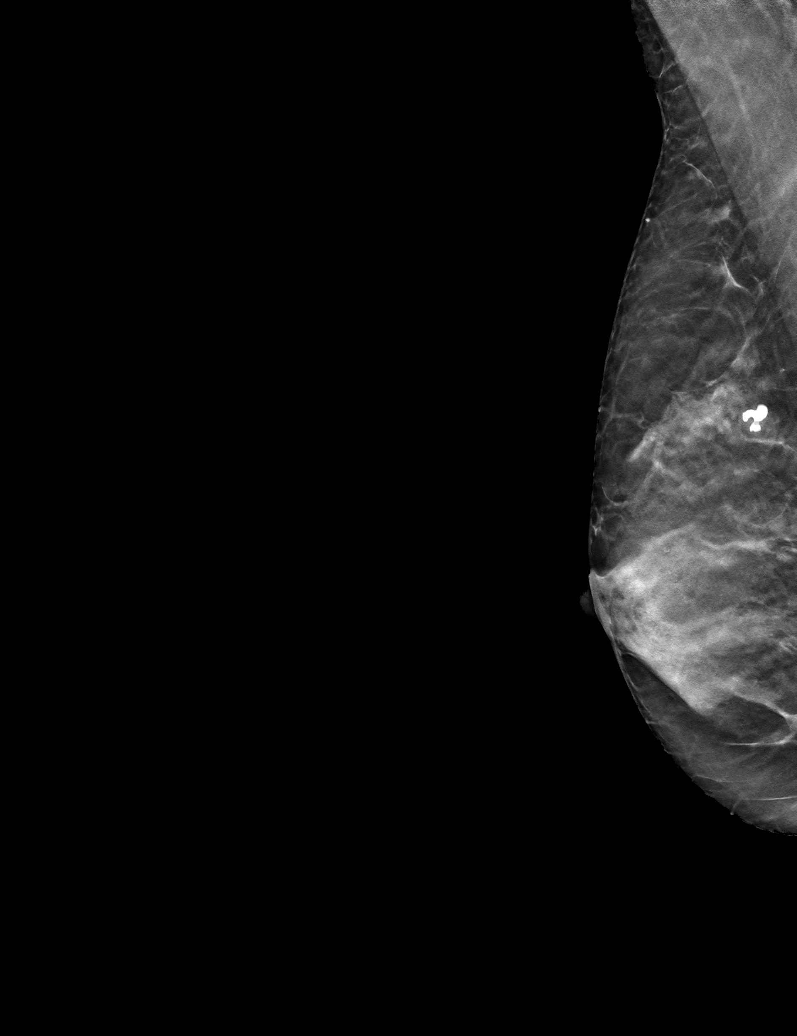

[6 of 30 positions shown; findings below may reference images not displayed]

ACR Breast Density Category d: The breast tissue is extremely dense,
which lowers the sensitivity of mammography
FINDINGS: There are no findings suspicious for malignancy. Images were
processed with CAD.
IMPRESSION: No mammographic evidence of malignancy. A result letter of this
screening mammogram will be mailed directly to the patient.

RECOMMENDATION:
Screening mammogram in one year. (Code:WO-[id number])

BI-RADS CATEGORY  1: Negative.

## 2020-12-09 DIAGNOSIS — H40053 Ocular hypertension, bilateral: Secondary | ICD-10-CM | POA: Diagnosis not present

## 2020-12-22 ENCOUNTER — Ambulatory Visit: Payer: PPO

## 2020-12-23 ENCOUNTER — Other Ambulatory Visit: Payer: Self-pay

## 2020-12-23 ENCOUNTER — Ambulatory Visit (INDEPENDENT_AMBULATORY_CARE_PROVIDER_SITE_OTHER): Payer: PPO

## 2020-12-23 DIAGNOSIS — Z23 Encounter for immunization: Secondary | ICD-10-CM | POA: Diagnosis not present

## 2020-12-25 ENCOUNTER — Other Ambulatory Visit: Payer: Self-pay | Admitting: Cardiology

## 2020-12-25 DIAGNOSIS — I480 Paroxysmal atrial fibrillation: Secondary | ICD-10-CM

## 2020-12-25 NOTE — Telephone Encounter (Signed)
Prescription refill request for Xarelto received.  Indication: afib  Last office visit:[place], 03/17/2020 Weight: 50.4 kg Age: 70 yo  Scr: 02/05/2020, 0.83 CrCl:  91ml/min   Pt will need blood work at appointment in January for [facility] follow up.

## 2021-01-28 ENCOUNTER — Other Ambulatory Visit: Payer: Self-pay | Admitting: Family Medicine

## 2021-01-28 DIAGNOSIS — Z1231 Encounter for screening mammogram for malignant neoplasm of breast: Secondary | ICD-10-CM

## 2021-02-10 DIAGNOSIS — Z124 Encounter for screening for malignant neoplasm of cervix: Secondary | ICD-10-CM | POA: Diagnosis not present

## 2021-02-24 ENCOUNTER — Encounter: Payer: PPO | Admitting: Family Medicine

## 2021-03-02 ENCOUNTER — Ambulatory Visit
Admission: RE | Admit: 2021-03-02 | Discharge: 2021-03-02 | Disposition: A | Discharge status: Home / Self Care | Payer: PPO | Source: Ambulatory Visit

## 2021-03-02 DIAGNOSIS — Z1231 Encounter for screening mammogram for malignant neoplasm of breast: Secondary | ICD-10-CM | POA: Diagnosis not present

## 2021-03-09 ENCOUNTER — Ambulatory Visit (INDEPENDENT_AMBULATORY_CARE_PROVIDER_SITE_OTHER): Payer: PPO | Admitting: Family Medicine

## 2021-03-09 VITALS — BP 130/70 | HR 75 | Ht 65.0 in | Wt 114.2 lb

## 2021-03-09 DIAGNOSIS — Z0000 Encounter for general adult medical examination without abnormal findings: Secondary | ICD-10-CM | POA: Diagnosis not present

## 2021-03-09 LAB — HEPATIC FUNCTION PANEL
ALT: 25 U/L (ref 0–35)
AST: 22 U/L (ref 0–37)
Albumin: 4.3 g/dL (ref 3.5–5.2)
Alkaline Phosphatase: 65 U/L (ref 39–117)
Bilirubin, Direct: 0.1 mg/dL (ref 0.0–0.3)
Total Bilirubin: 0.6 mg/dL (ref 0.2–1.2)
Total Protein: 7.6 g/dL (ref 6.0–8.3)

## 2021-03-09 LAB — LIPID PANEL
Cholesterol: 207 mg/dL — ABNORMAL HIGH (ref 0–200)
HDL: 55.4 mg/dL (ref >39.00)
LDL Cholesterol: 135 mg/dL — ABNORMAL HIGH (ref 0–99)
NonHDL: 151.74
Total CHOL/HDL Ratio: 4
Triglycerides: 86 mg/dL (ref 0.0–149.0)
VLDL: 17.2 mg/dL (ref 0.0–40.0)

## 2021-03-09 LAB — CBC WITH DIFFERENTIAL/PLATELET
Basophils Absolute: 0 10*3/uL (ref 0.0–0.1)
Basophils Relative: 0.7 % (ref 0.0–3.0)
Eosinophils Absolute: 0.1 10*3/uL (ref 0.0–0.7)
Eosinophils Relative: 1 % (ref 0.0–5.0)
HCT: 44.5 % (ref 36.0–46.0)
Hemoglobin: 14.7 g/dL (ref 12.0–15.0)
Lymphocytes Relative: 34.9 % (ref 12.0–46.0)
Lymphs Abs: 1.8 10*3/uL (ref 0.7–4.0)
MCHC: 32.9 g/dL (ref 30.0–36.0)
MCV: 97.8 fl (ref 78.0–100.0)
Monocytes Absolute: 0.4 10*3/uL (ref 0.1–1.0)
Monocytes Relative: 7.4 % (ref 3.0–12.0)
Neutro Abs: 2.9 10*3/uL (ref 1.4–7.7)
Neutrophils Relative %: 56 % (ref 43.0–77.0)
Platelets: 256 10*3/uL (ref 150.0–400.0)
RBC: 4.55 Mil/uL (ref 3.87–5.11)
RDW: 12.7 % (ref 11.5–15.5)
WBC: 5.2 10*3/uL (ref 4.0–10.5)

## 2021-03-09 LAB — BASIC METABOLIC PANEL
BUN: 15 mg/dL (ref 6–23)
CO2: 27 mEq/L (ref 19–32)
Calcium: 9.6 mg/dL (ref 8.4–10.5)
Chloride: 103 mEq/L (ref 96–112)
Creatinine, Ser: 0.79 mg/dL (ref 0.40–1.20)
GFR: 75.41 mL/min (ref >60.00)
Glucose, Bld: 88 mg/dL (ref 70–99)
Potassium: 4.4 mEq/L (ref 3.5–5.1)
Sodium: 138 mEq/L (ref 135–145)

## 2021-03-09 LAB — TSH: TSH: 1.83 u[IU]/mL (ref 0.35–5.50)

## 2021-03-09 NOTE — Progress Notes (Signed)
Established Patient Office Visit  Subjective:  Patient ID: Tammy [name], female    DOB: [birth year]  Age: 71 y.o. MRN: [id number]  CC:  Chief Complaint  Patient presents with   Annual Exam    HPI [name] presents for physical exam.  She has history of atrial fibrillation and is maintained on Xarelto and also takes low-dose metoprolol.  She also has history of PACs.  She has given up alcohol (which previously triggered PACs) and has had no recent flareups.  She still plays tennis for exercise but not as much recently-over the winter.  She is still followed by GYN.  She has osteoporosis and currently taking algae Cal.  She states her last bone density was improved some.  Health maintenance reviewed.  Vaccines up-to-date with exception she has not had Shingrix.  She has had influenza vaccine and previous Pneumovax and Prevnar 13.  Needs repeat colonoscopy this spring  Family history significant for sister and father with colon cancer.  Her father had history of atrial fibrillation.  Mother with bladder cancer and CAD around age 65.  Her mother was a smoker  Married with 2 children and 3 grandchildren.  Non-smoker.  No alcohol.  Enjoys tennis.  Past Medical History:  Diagnosis Date   Hyperlipidemia     Past Surgical History:  Procedure Laterality Date   DILATION AND CURETTAGE OF UTERUS N/A     Family History  Problem Relation Age of Onset   Heart disease Mother 54       CABG   Cancer Mother        bladder cancer   Heart disease Father        MVP, atrial fibrilation   Cancer Father        colon cancer   Stroke Father    Cancer Sister        colon cancer   Stroke Sister     Social History   Socioeconomic History   Marital status: Married    Spouse name: Not on file   Number of children: Not on file   Years of education: Not on file   Highest education level: Not on file  Occupational History   Not on file  Tobacco Use   Smoking status: Former     Packs/day: 1.00    Years: 5.00    Pack years: 5.00    Types: Cigarettes    Quit date: 05/26/1973    Years since quitting: 47.8   Smokeless tobacco: Never   Tobacco comments:    Former smoker 11/17/2020  Vaping Use   Vaping Use: Never used  Substance and Sexual Activity   Alcohol use: Yes    Alcohol/week: 1.0 standard drink    Types: 1 Glasses of wine per week    Comment: 4 x's a wk a glass of wine   Drug use: Never   Sexual activity: Not on file  Other Topics Concern   Not on file  Social History Narrative   Not on file   Social Determinants of Health   Financial Resource Strain: Low Risk    Difficulty of Paying Living Expenses: Not hard at all  Food Insecurity: No Food Insecurity   Worried About Charity fundraiser in the Last Year: Never true   Ran Out of Food in the Last Year: Never true  Transportation Needs: No Transportation Needs   Lack of Transportation (Medical): No   Lack of Transportation (Non-Medical): No  Physical Activity:  Sufficiently Active   Days of Exercise per Week: 4 days   Minutes of Exercise per Session: 60 min  Stress: No Stress Concern Present   Feeling of Stress : Not at all  Social Connections: Moderately Integrated   Frequency of Communication with Friends and Family: More than three times a week   Frequency of Social Gatherings with Friends and Family: More than three times a week   Attends Religious Services: Never   Marine scientist or Organizations: Yes   Attends Music therapist: More than 4 times per year   Marital Status: Married  Human resources officer Violence: Not At Risk   Fear of Current or Ex-Partner: No   Emotionally Abused: No   Physically Abused: No   Sexually Abused: No    Outpatient Medications Prior to Visit  Medication Sig Dispense Refill   metoprolol succinate (TOPROL-XL) 25 MG 24 hr tablet Take 0.5 tablets (12.5 mg total) by mouth at bedtime. 90 tablet 3   metoprolol tartrate (LOPRESSOR) 25 MG tablet  TAKE 1 TABLET BY MOUTH AS NEEDED 30 tablet 11   Multiple Vitamin (MULTIVITAMIN) tablet Algaecal- Taking 2 tablets by mouth in the am and 2 tablets at dinner     PREVIDENT 5000 BOOSTER PLUS 1.1 % PSTE Place onto teeth.     Strontium Chloride CRYS Taking 2 tablets by mouth at bedtime     XARELTO 20 MG TABS tablet TAKE 1 TABLET(20 MG) BY MOUTH DAILY WITH SUPPER 90 tablet 0   No facility-administered medications prior to visit.    Allergies  Allergen Reactions   Penicillins Rash   Codeine Other (See Comments)    Pt. States "crazy dreams"   Contrast Media [Iodinated Contrast Media] Itching    Eye Swelling    ROS Review of Systems  Constitutional:  Negative for activity change, appetite change, fatigue, fever and unexpected weight change.  HENT:  Negative for ear pain, hearing loss, sore throat and trouble swallowing.   Eyes:  Negative for visual disturbance.  Respiratory:  Negative for cough and shortness of breath.   Cardiovascular:  Negative for chest pain and palpitations.  Gastrointestinal:  Negative for abdominal pain, blood in stool, constipation and diarrhea.  Genitourinary:  Negative for dysuria and hematuria.  Musculoskeletal:  Negative for arthralgias, back pain and myalgias.  Skin:  Negative for rash.  Neurological:  Negative for dizziness, syncope and headaches.  Hematological:  Negative for adenopathy.  Psychiatric/Behavioral:  Negative for confusion and dysphoric mood.      Objective:    Physical Exam Constitutional:      Appearance: She is well-developed.  HENT:     Head: Normocephalic and atraumatic.  Eyes:     Pupils: Pupils are equal, round, and reactive to light.  Neck:     Thyroid: No thyromegaly.  Cardiovascular:     Rate and Rhythm: Normal rate and regular rhythm.     Heart sounds: Normal heart sounds. No murmur heard. Pulmonary:     Effort: No respiratory distress.     Breath sounds: Normal breath sounds. No wheezing or rales.  Abdominal:      General: Bowel sounds are normal. There is no distension.     Palpations: Abdomen is soft. There is no mass.     Tenderness: There is no abdominal tenderness. There is no guarding or rebound.  Musculoskeletal:        General: Normal range of motion.     Cervical back: Normal range of motion and  neck supple.  Lymphadenopathy:     Cervical: No cervical adenopathy.  Skin:    Findings: No rash.  Neurological:     Mental Status: She is alert and oriented to person, place, and time.     Cranial Nerves: No cranial nerve deficit.  Psychiatric:        Behavior: Behavior normal.        Thought Content: Thought content normal.        Judgment: Judgment normal.    BP 130/70 (BP Location: Left Arm, Patient Position: Sitting, Cuff Size: Normal)    Pulse 75    Ht 5\' 5"  (1.651 m)    Wt 114 lb 3.2 oz (51.8 kg)    LMP  (LMP Unknown)    SpO2 99%    BMI 19.00 kg/m  Wt Readings from Last 3 Encounters:  03/09/21 114 lb 3.2 oz (51.8 kg)  11/17/20 111 lb 3.2 oz (50.4 kg)  07/25/20 112 lb 4.8 oz (50.9 kg)     Health Maintenance Due  Topic Date Due   Zoster Vaccines- Shingrix (1 of 2) Never done   COVID-19 Vaccine (6 - Booster) 03/27/2020    There are no preventive care reminders to display for this patient.  Lab Results  Component Value Date   TSH 1.47 02/05/2020   Lab Results  Component Value Date   WBC 12.9 (H) 07/25/2020   HGB 13.8 07/25/2020   HCT 41.1 07/25/2020   MCV 93.4 07/25/2020   PLT 289.0 07/25/2020   Lab Results  Component Value Date   NA 139 02/05/2020   K 4.3 02/05/2020   CO2 29 02/05/2020   GLUCOSE 81 02/05/2020   BUN 17 02/05/2020   CREATININE 0.83 02/05/2020   BILITOT 0.6 02/05/2020   ALKPHOS 66 02/05/2020   AST 19 02/05/2020   ALT 21 02/05/2020   PROT 6.7 02/05/2020   ALBUMIN 4.1 02/05/2020   CALCIUM 9.3 02/05/2020   ANIONGAP 8 01/20/2016   GFR 71.62 02/05/2020   Lab Results  Component Value Date   CHOL 182 02/05/2020   Lab Results  Component Value  Date   HDL 56.30 02/05/2020   Lab Results  Component Value Date   LDLCALC 112 (H) 02/05/2020   Lab Results  Component Value Date   TRIG 68.0 02/05/2020   Lab Results  Component Value Date   CHOLHDL 3 02/05/2020   No results found for: HGBA1C    Assessment & Plan:   Problem List Items Addressed This Visit   None Visit Diagnoses     Physical exam    -  Primary   Relevant Orders   Basic metabolic panel   Lipid panel   CBC with Differential/Platelet   TSH   Hepatic function panel     Generally healthy 71 year old female.  Past history of atrial fibrillation.  Patient maintained on Xarelto and low-dose metoprolol.  -Reminder for repeat colonoscopy this spring --Discussed Shingrix vaccine and she will consider -Obtain screening labs as above -Step up frequency of weightbearing exercise and also consider more consistent upper body strengthening -Recommend repeat DEXA within the next year.  She is getting these through her GYN.  No orders of the defined types were placed in this encounter.   Follow-up: No follow-ups on file.    Carolann Littler, MD

## 2021-03-09 NOTE — Patient Instructions (Signed)
Consider Shingrix vaccine at some point this year  Repeat colonoscopy by April or May of this year.

## 2021-03-17 ENCOUNTER — Telehealth (HOSPITAL_COMMUNITY): Payer: Self-pay | Admitting: *Deleted

## 2021-03-17 MED ORDER — METOPROLOL SUCCINATE ER 25 MG PO TB24: 12.5000 mg | ORAL_TABLET | Freq: Two times a day (BID) | ORAL | 3 refills | Status: DC

## 2021-03-17 NOTE — Telephone Encounter (Signed)
Patient called in stating for the last week she is having intermittently irregular heart rhythm with HRs over 100. Can feel the palpitations but otherwise without symptoms.  Pt will try increasing her metoprolol succinate back to 25mg  a day since she had been on this in the past but decreased it due to fatigue which may have been related to an illness now that she looks back on it.  Pt has follow up with Dr. Marlou Porch later in the week and will call if she doesn't tolerate higher dose of metoprolol.

## 2021-03-19 ENCOUNTER — Encounter: Payer: Self-pay | Admitting: Cardiology

## 2021-03-19 ENCOUNTER — Ambulatory Visit (INDEPENDENT_AMBULATORY_CARE_PROVIDER_SITE_OTHER): Payer: PPO | Admitting: Cardiology

## 2021-03-19 ENCOUNTER — Other Ambulatory Visit: Payer: PPO

## 2021-03-19 ENCOUNTER — Ambulatory Visit (INDEPENDENT_AMBULATORY_CARE_PROVIDER_SITE_OTHER): Payer: PPO

## 2021-03-19 ENCOUNTER — Other Ambulatory Visit: Payer: Self-pay

## 2021-03-19 DIAGNOSIS — I480 Paroxysmal atrial fibrillation: Secondary | ICD-10-CM

## 2021-03-19 DIAGNOSIS — I6523 Occlusion and stenosis of bilateral carotid arteries: Secondary | ICD-10-CM

## 2021-03-19 DIAGNOSIS — E7800 Pure hypercholesterolemia, unspecified: Secondary | ICD-10-CM | POA: Diagnosis not present

## 2021-03-19 DIAGNOSIS — D6869 Other thrombophilia: Secondary | ICD-10-CM | POA: Diagnosis not present

## 2021-03-19 DIAGNOSIS — I6529 Occlusion and stenosis of unspecified carotid artery: Secondary | ICD-10-CM | POA: Insufficient documentation

## 2021-03-19 DIAGNOSIS — R9431 Abnormal electrocardiogram [ECG] [EKG]: Secondary | ICD-10-CM | POA: Insufficient documentation

## 2021-03-19 MED ORDER — METOPROLOL TARTRATE 25 MG PO TABS: 25.0000 mg | ORAL_TABLET | ORAL | 11 refills | Status: DC | PRN

## 2021-03-19 NOTE — Assessment & Plan Note (Signed)
Mild right carotid artery stenosis previously, no flow limitation

## 2021-03-19 NOTE — Assessment & Plan Note (Addendum)
Previous 3-hour episode, perhaps a glass of wine had triggered it.  Echocardiogram reassuring.  She is decreased her caffeine.  She has a family history of A. fib Father with stroke after A. fib.  Takes metoprolol on as-needed basis.  Has seen atrial fibrillation clinic in the past. Rare premature atrial contractions and PVCs noted.  No atrial fibrillation on monitoring 11/2018.  She has been feeling more and more episodes of atrial fibrillation she thinks.  She has had apple watch data stating atrial fibrillation as well.  We will have her set up with electrophysiology to discuss potential ablative efforts.  We will also place a Zio patch monitor to detect any further atrial fibrillation.

## 2021-03-19 NOTE — Assessment & Plan Note (Signed)
Similar EKGs have shown "septal infarct or anterior infarct pattern, poor R wave progression ".  I went back and reviewed several different EKGs.  I reviewed her EKG just prior to her echocardiogram performed in 2017 and the echocardiogram did not show any evidence of anterior wall motion abnormality.  EKG is demonstrating a false positive likely secondary to lead placement

## 2021-03-19 NOTE — Progress Notes (Signed)
Cardiology Office Note   Date:  03/19/2021   ID:  Tammy, [name] April 10, 1950, MRN [id number]  PCP:  Eulas Post, MD  Cardiologist:  Candee Furbish, MD  Electrophysiologist:  None    History of Present Illness:    Tammy [name] is a 71 y.o. female here for the follow-up of atrial fibrillation and hyperlipidemia.  She called our office 03/17/2021 and reported palpitations with intermittently irregular heart rhythm and heart rates over 100 bpm. She would try increasing her metoprolol back to 25 mg daily. This had been previously reduced due to fatigue, but she believes her fatigue may have been caused by an illness instead. She was advised to call if she didn't tolerate 25 mg metoprolol and follow-up with cardiology as scheduled.  Atrial fibrillation previous rapid ventricular response of 141 bpm spontaneously converted.  Episode was about 3 hours duration.  CHADSVASc score at that time was 3.  She was placed on Xarelto.  Troponins were normal and EF was normal on echocardiogram.  At her last appointment she continued to suffer from intermittent palpitations/tachycardia occurring at least once per day. Sometimes the episodes will result in rapid heart rate of about 140 beats a minute and then break for 15 seconds or so and then go back to 140 beats a minute.  Previous monitor in June 2021 personally reviewed shows episodes of what appear to be paroxysmal atrial tachycardia.  I do not see any true episodes of atrial fibrillation at that time.  She does enjoy tennis.  One time she did have to stop her again when she was going too quickly.  This sometimes makes her anxious.  She is clearly symptomatic with these palpitations.  She will take an occasional extra Toprol.  In fact she previously called Rosaria Ferries, PA on call who advised her to take her metoprolol but to watch out for any signs of significant hypotension.  She also was infected with Covid, approximately 3 days of  cold-like symptoms.  She was fully vaccinated and boosted.  No residual symptoms  She remembers having PACs for many years.  Her father had PACs as well.  She recounts that her father had atrial fibrillation but refused to take anticoagulation and ended up having a stroke.  Her mother had CABG in her 73s. One of her sisters had carotid stenosis bilaterally and a stroke.  Today: Overall, she appears well.  Last night she reports being in atrial fibrillation, and presents her Apple watch phone log today. She notes these episodes seem to be more frequent lately, and may last for most of the day. In clinic today she felt her "heart going crazy" following her EKG. To her it feels like a "bubbling" in her heart.  She denies any chest pain, or shortness of breath. No lightheadedness, headaches, syncope, orthopnea, PND, lower extremity edema or exertional symptoms.    Past Medical History:  Diagnosis Date   Hyperlipidemia    Past Surgical History:  Procedure Laterality Date   DILATION AND CURETTAGE OF UTERUS N/A      No outpatient medications have been marked as taking for the 03/19/21 encounter (Office Visit) with Jerline Pain, MD.     Allergies:   Penicillins, Codeine, and Contrast media [iodinated contrast media]   Social History   Tobacco Use   Smoking status: Former    Packs/day: 1.00    Years: 5.00    Pack years: 5.00    Types: Cigarettes  Quit date: 05/26/1973    Years since quitting: 47.8   Smokeless tobacco: Never   Tobacco comments:    Former smoker 11/17/2020  Vaping Use   Vaping Use: Never used  Substance Use Topics   Alcohol use: Yes    Alcohol/week: 1.0 standard drink    Types: 1 Glasses of wine per week    Comment: 4 x's a wk a glass of wine   Drug use: Never     Family Hx: The patient's family history includes Cancer in her father, mother, and sister; Heart disease in her father; Heart disease (age of onset: 92) in her mother; Stroke in her father and  sister.  ROS:   Please see the history of present illness.    (+) Palpitations All other systems reviewed and are negative.   Prior CV studies:   The following studies were reviewed today:  Monitor 08/2019: Sinus rhythm with heart rate average of 70 bpm Occasional PACs Occasional episodes of paroxysmal supraventricular tachycardia, appears to be paroxysmal atrial tachycardia with average heart rate of 144 bpm during these episodes. No pauses, no atrial fibrillation.  Monitor 12/2018: Sinus rhythm Rare premature atrial contractions and premature ventricular contractions Nonsustained SVT, likely atrial tachycardia No sustained arrhythmias No afib  Carotid ultrasound 05/26/2017 showed mild right carotid artery plaque  Echocardiogram 02/19/2016 showed EF of 60% with trivial pericardial effusion   Labs/Other Tests and Data Reviewed:    EKG:   EKG is personally reviewed and interpreted. 03/19/2021: Sinus rhythm. Rate 70 bpm. EKG in 2020 showed normal sinus rhythm with sinus arrhythmia atrial enlargement  Recent Labs: 03/09/2021: ALT 25; BUN 15; Creatinine, Ser 0.79; Hemoglobin 14.7; Platelets 256.0; Potassium 4.4; Sodium 138; TSH 1.83   Recent Lipid Panel Lab Results  Component Value Date/Time   CHOL 207 (H) 03/09/2021 09:59 AM   CHOL 189 06/27/2019 08:44 AM   TRIG 86.0 03/09/2021 09:59 AM   HDL 55.40 03/09/2021 09:59 AM   HDL 63 06/27/2019 08:44 AM   CHOLHDL 4 03/09/2021 09:59 AM   LDLCALC 135 (H) 03/09/2021 09:59 AM   LDLCALC 113 (H) 06/27/2019 08:44 AM   LDLDIRECT 142.6 03/04/2010 10:01 AM    Wt Readings from Last 3 Encounters:  03/19/21 114 lb (51.7 kg)  03/09/21 114 lb 3.2 oz (51.8 kg)  11/17/20 111 lb 3.2 oz (50.4 kg)     Risk Assessment/Calculations:    CHA2DS2-VASc Score =    This indicates a  % annual risk of stroke. The patient's score is based upon:       Objective:    VS:  BP 112/70 (BP Location: Left Arm, Patient Position: Sitting, Cuff Size:  Normal)    Pulse 70    Ht 5\' 5"  (1.651 m)    Wt 114 lb (51.7 kg)    LMP  (LMP Unknown)    SpO2 96%    BMI 18.97 kg/m     Wt Readings from Last 3 Encounters:  03/19/21 114 lb (51.7 kg)  03/09/21 114 lb 3.2 oz (51.8 kg)  11/17/20 111 lb 3.2 oz (50.4 kg)     GEN: Well nourished, well developed in no acute distress HEENT: Normal NECK: No JVD; No carotid bruits LYMPHATICS: No lymphadenopathy CARDIAC: RRR, no murmurs, rubs, gallops RESPIRATORY:  Clear to auscultation without rales, wheezing or rhonchi  ABDOMEN: Soft, non-tender, non-distended MUSCULOSKELETAL:  No edema; No deformity  SKIN: Warm and dry NEUROLOGIC:  Alert and oriented x 3 PSYCHIATRIC:  Normal affect    ASSESSMENT &  PLAN:    Atrial fibrillation (HCC) Previous 3-hour episode, perhaps a glass of wine had triggered it.  Echocardiogram reassuring.  She is decreased her caffeine.  She has a family history of A. fib Father with stroke after A. fib.  Takes metoprolol on as-needed basis.  Has seen atrial fibrillation clinic in the past. Rare premature atrial contractions and PVCs noted.  No atrial fibrillation on monitoring 11/2018.  She has been feeling more and more episodes of atrial fibrillation she thinks.  She has had apple watch data stating atrial fibrillation as well.  We will have her set up with electrophysiology to discuss potential ablative efforts.  We will also place a Zio patch monitor to detect any further atrial fibrillation.  Hyperlipidemia Prior LDL 113 checked by her primary physician.  With her mild carotid plaque and family history would benefit from Crestor.  Diet therapy  Secondary hypercoagulable state ([place]) Continue with Xarelto CHADSVASc 3.  Female, carotid artery plaque Prior creatinine 0.8 hemoglobin 15.  Outside labs.  Carotid artery plaque Mild right carotid artery stenosis previously, no flow limitation  Nonspecific abnormal electrocardiogram (ECG) (EKG) Similar EKGs have shown "septal  infarct or anterior infarct pattern, poor R wave progression ".  I went back and reviewed several different EKGs.  I reviewed her EKG just prior to her echocardiogram performed in 2017 and the echocardiogram did not show any evidence of anterior wall motion abnormality.  EKG is demonstrating a false positive likely secondary to lead placement     Medication Adjustments/Labs and Tests Ordered: Current medicines are reviewed at length with the patient today.  Concerns regarding medicines are outlined above.   Tests Ordered: Orders Placed This Encounter  Procedures   Ambulatory referral to Cardiac Electrophysiology   LONG TERM MONITOR (3-14 DAYS)   EKG 12-Lead   Medication Changes: Meds ordered this encounter  Medications   metoprolol tartrate (LOPRESSOR) 25 MG tablet    Sig: Take 1 tablet (25 mg total) by mouth as needed.    Dispense:  30 tablet    Refill:  11   Follow Up:  Refer for consultation with EP regarding ablation.  I,Mathew Stumpf,acting as a Education administrator for [employer], MD.,have documented all relevant documentation on the behalf of Candee Furbish, MD,as directed by  Candee Furbish, MD while in the presence of Candee Furbish, MD.  I, Candee Furbish, MD, have reviewed all documentation for this visit. The documentation on 03/19/21 for the exam, diagnosis, procedures, and orders are all accurate and complete.   Signed, Candee Furbish, MD  03/19/2021 2:36 PM    [facility]

## 2021-03-19 NOTE — Patient Instructions (Signed)
Medication Instructions:  The current medical regimen is effective;  continue present plan and medications.  *If you need a refill on your cardiac medications before your next appointment, please call your pharmacy*  Testing/Procedures: [place] Monitor Instructions  Your physician has requested you wear a ZIO patch monitor for 14 days.  This is a single patch monitor. [employer] supplies one patch monitor per enrollment. Additional stickers are not available. Please do not apply patch if you will be having a Nuclear Stress Test,  Echocardiogram, Cardiac CT, MRI, or Chest Xray during the period you would be wearing the  monitor. The patch cannot be worn during these tests. You cannot remove and re-apply the  ZIO XT patch monitor.  Your ZIO patch monitor will be mailed 3 day USPS to your address on file. It may take 3-5 days  to receive your monitor after you have been enrolled.  Once you have received your monitor, please review the enclosed instructions. Your monitor  has already been registered assigning a specific monitor serial # to you.  Billing and Patient Assistance Program Information  We have supplied [employer] with any of your insurance information on file for billing purposes. [employer] offers a sliding scale Patient Assistance Program for patients that do not have  insurance, or whose insurance does not completely cover the cost of the ZIO monitor.  You must apply for the Patient Assistance Program to qualify for this discounted rate.  To apply, please call [employer] at [phone], select option 4, select option 2, ask to apply for  Patient Assistance Program. Theodore Demark will ask your household income, and how many people  are in your household. They will quote your out-of-pocket cost based on that information.  [employer] will also be able to set up a 64-month, interest-free payment plan if needed.  Applying the monitor   Shave hair from upper left chest.  Hold abrader disc  by orange tab. Rub abrader in 40 strokes over the upper left chest as  indicated in your monitor instructions.  Clean area with 4 enclosed alcohol pads. Let dry.  Apply patch as indicated in monitor instructions. Patch will be placed under collarbone on left  side of chest with arrow pointing upward.  Rub patch adhesive wings for 2 minutes. Remove white label marked "1". Remove the white  label marked "2". Rub patch adhesive wings for 2 additional minutes.  While looking in a mirror, press and release button in center of patch. A small green light will  flash 3-4 times. This will be your only indicator that the monitor has been turned on.  Do not shower for the first 24 hours. You may shower after the first 24 hours.  Press the button if you feel a symptom. You will hear a small click. Record Date, Time and  Symptom in the Patient Logbook.  When you are ready to remove the patch, follow instructions on the last 2 pages of Patient  Logbook. Stick patch monitor onto the last page of Patient Logbook.  Place Patient Logbook in the blue and white box. Use locking tab on box and tape box closed  securely. The blue and white box has prepaid postage on it. Please place it in the mailbox as  soon as possible. Your physician should have your test results approximately 7 days after the  monitor has been mailed back to [facility].  Call [facility] at [phone] if you have questions regarding  your ZIO XT patch  monitor. Call them immediately if you see an orange light blinking on your  monitor.  If your monitor falls off in less than 4 days, contact our Monitor department at [phone].  If your monitor becomes loose or falls off after 4 days call [employer] at [phone] for  suggestions on securing your monitor  You have been referred to see Electrophysiology to discuss possible ablation for At Fib.  Follow-Up: At [facility], you and your health needs are our  priority.  As part of our continuing mission to provide you with exceptional heart care, we have created designated Provider Care Teams.  These Care Teams include your primary Cardiologist (physician) and Advanced Practice Providers (APPs -  Physician Assistants and Nurse Practitioners) who all work together to provide you with the care you need, when you need it.  We recommend signing up for the patient portal called "MyChart".  Sign up information is provided on this After Visit Summary.  MyChart is used to connect with patients for Virtual Visits (Telemedicine).  Patients are able to view lab/test results, encounter notes, upcoming appointments, etc.  Non-urgent messages can be sent to your provider as well.   To learn more about what you can do with MyChart, go to [email or web address].    Your next appointment:   6 month(s)  The format for your next appointment:   In Person  Provider:   Candee Furbish, MD     Thank you for choosing [facility]!!

## 2021-03-19 NOTE — Assessment & Plan Note (Signed)
Prior LDL 113 checked by her primary physician.  With her mild carotid plaque and family history would benefit from Crestor.  Diet therapy

## 2021-03-19 NOTE — Assessment & Plan Note (Signed)
Continue with Xarelto CHADSVASc 3.  Female, carotid artery plaque Prior creatinine 0.8 hemoglobin 15.  Outside labs.

## 2021-03-19 NOTE — Progress Notes (Unsigned)
Enrolled patient for a 14 day Zio XT monitor to be mailed to patients home   03/19/21 14 day ZIO XT serial number [id number] mailed to patient 1/18, applied to patient in office.

## 2021-03-23 ENCOUNTER — Telehealth: Payer: Self-pay | Admitting: Cardiology

## 2021-03-23 DIAGNOSIS — I480 Paroxysmal atrial fibrillation: Secondary | ICD-10-CM

## 2021-03-23 NOTE — Telephone Encounter (Signed)
Pt takes Metoprolol Succinate 25 mg 1/2 tablet twice a day and has tartrate 25 mg to take daily as needed for breakthrough.   Pharmacy aware of instructions (1) tablet by mouth, daily as needed.

## 2021-03-23 NOTE — Telephone Encounter (Signed)
Pt c/o medication issue:  1. Name of Medication: metoprolol tartrate (LOPRESSOR) 25 MG tablet  2. How are you currently taking this medication (dosage and times per day)? Take 1 tablet (25 mg total) by mouth as needed.  3. Are you having a reaction (difficulty breathing--STAT)? No   4. What is your medication issue? Pharmacy states pts insurance is requesting frequency to process.. please advise

## 2021-03-25 ENCOUNTER — Other Ambulatory Visit: Payer: Self-pay | Admitting: Cardiology

## 2021-03-25 DIAGNOSIS — I480 Paroxysmal atrial fibrillation: Secondary | ICD-10-CM

## 2021-03-25 NOTE — Telephone Encounter (Signed)
Xarelto 20mg  refill request received. Pt is 71 years old, weight-51.7kg, Crea-0.79 on 03/09/2021, last seen by Dr. Marlou Porch on 03/19/2021, Diagnosis-Afib, CrCl-66.28ml/min; Dose is appropriate based on dosing criteria. Will send in refill to requested pharmacy.

## 2021-04-10 DIAGNOSIS — I480 Paroxysmal atrial fibrillation: Secondary | ICD-10-CM | POA: Diagnosis not present

## 2021-04-24 ENCOUNTER — Telehealth: Payer: Self-pay | Admitting: Cardiology

## 2021-04-24 NOTE — Telephone Encounter (Signed)
Patient would like results to her monitor.  ?

## 2021-04-24 NOTE — Telephone Encounter (Signed)
Pt is aware results have been received and awaiting Dr Marlou Porch' review.  She will be notified of results once they are available.  She was appreciative of the call back and information. ?

## 2021-05-01 ENCOUNTER — Encounter: Payer: Self-pay | Admitting: Cardiology

## 2021-05-03 NOTE — Progress Notes (Deleted)
Saw mark 03/19/2021 ?More AF episodes. On xarelto. ?Apple watch ? ?04/10/2021 zio - 31% burden of AF ? ?PVI ?Echo ? ?

## 2021-05-05 ENCOUNTER — Other Ambulatory Visit: Payer: Self-pay

## 2021-05-05 ENCOUNTER — Ambulatory Visit: Payer: PPO | Admitting: Cardiology

## 2021-05-05 ENCOUNTER — Encounter: Payer: Self-pay | Admitting: Cardiology

## 2021-05-05 VITALS — BP 114/66 | HR 69 | Ht 65.0 in | Wt 117.2 lb

## 2021-05-05 DIAGNOSIS — I480 Paroxysmal atrial fibrillation: Secondary | ICD-10-CM

## 2021-05-05 DIAGNOSIS — I6523 Occlusion and stenosis of bilateral carotid arteries: Secondary | ICD-10-CM

## 2021-05-05 NOTE — Patient Instructions (Signed)
Medication Instructions:  ?Your physician recommends that you continue on your current medications as directed. Please refer to the Current Medication list given to you today. ? ?*If you need a refill on your cardiac medications before your next appointment, please call your pharmacy* ? ? ?Lab Work: ?None  ?If you have labs (blood work) drawn today and your tests are completely normal, you will receive your results only by: ?MyChart Message (if you have MyChart) OR ?A paper copy in the mail ?If you have any lab test that is abnormal or we need to change your treatment, we will call you to review the results. ? ? ?Follow-Up: ?At [facility], you and your health needs are our priority.  As part of our continuing mission to provide you with exceptional heart care, we have created designated Provider Care Teams.  These Care Teams include your primary Cardiologist (physician) and Advanced Practice Providers (APPs -  Physician Assistants and Nurse Practitioners) who all work together to provide you with the care you need, when you need it. ? ? ?Your next appointment:   ?Call us when you decide about the ablation ?Current available dates: May 2, 12, 15, 22, or 26. There is no guarantee that these dates will still be available once you decide how you would like to proceed with the Afib ablation. ?

## 2021-05-05 NOTE — Progress Notes (Signed)
?Electrophysiology Office Note:   ? ?Date:  05/05/2021  ? ?ID:  Tammy [name], DOB 1950-11-05, MRN [id number] ? ?PCP:  Eulas Post, MD  ?[facility] Cardiologist:  Candee Furbish, MD  ?[facility] Electrophysiologist:  Vickie Epley, MD  ? ?Referring MD: Jerline Pain, MD  ? ?Chief Complaint: Consult for ablation ? ?History of Present Illness:   ? ?Tammy [name] is a 71 y.o. female who presents for an evaluation of Afib ablation at the request of Dr. Marlou Porch. Their medical history includes atrial fibrillation, and hyperlipidemia. ? ?She saw Dr. Marlou Porch 03/19/2021 and reported more frequent episodes of atrial fibrillation, also logged by her Apple watch. The episodes could last for most of a day. A Zio monitor was ordered and she was referred to EP to discuss potential ablative efforts. ? ?Overall, she is feeling well. Lately her arrhythmias have been less frequent. ? ?Typically she knows when she is in atrial fibrillation. When in Afib, she feels "quivering" sensations in her chest. While lying in bed in the middle of the night, this is worse when on her right side. She also is fatigued, but she denies associated SOB.  ? ?She denies any chest pain, shortness of breath, or peripheral edema. No lightheadedness, headaches, syncope, orthopnea, or PND. ? ?Of note, she endorses an allergy to contrast dye. She previously developed pruritis. ? ? ?  ?Past Medical History:  ?Diagnosis Date  ? Hyperlipidemia   ? ? ?Past Surgical History:  ?Procedure Laterality Date  ? DILATION AND CURETTAGE OF UTERUS N/A   ? ? ?Current Medications: ?Current Meds  ?Medication Sig  ? BORON PO Take 1.5 mg by mouth in the morning and at bedtime.  ? Calcium Carbonate-Vitamin D (CALCIUM-D PO)   ? Emollient (AMBI EVEN & CLEAR SPF 30 EX) apply  ? MAGNESIUM PO Take 175 mg by mouth in the morning and at bedtime.  ? metoprolol succinate (TOPROL-XL) 25 MG 24 hr tablet Take 0.5 tablets (12.5 mg total) by mouth 2 (two) times daily.  ?  metoprolol tartrate (LOPRESSOR) 25 MG tablet Take 1 tablet (25 mg total) by mouth as needed.  ? Multiple Vitamin (MULTIVITAMIN) tablet Algaecal- Taking 2 tablets by mouth in the am and 2 tablets at dinner  ? PREVIDENT 5000 BOOSTER PLUS 1.1 % PSTE Place onto teeth.  ? rivaroxaban (XARELTO) 20 MG TABS tablet TAKE 1 TABLET(20 MG) BY MOUTH DAILY WITH SUPPER  ? Strontium Chloride CRYS Taking 2 tablets by mouth at bedtime  ?  ? ?Allergies:   Penicillins, Codeine, and Contrast media [iodinated contrast media]  ? ?Social History  ? ?Socioeconomic History  ? Marital status: Married  ?  Spouse name: Not on file  ? Number of children: Not on file  ? Years of education: Not on file  ? Highest education level: Not on file  ?Occupational History  ? Not on file  ?Tobacco Use  ? Smoking status: Former  ?  Packs/day: 1.00  ?  Years: 5.00  ?  Pack years: 5.00  ?  Types: Cigarettes  ?  Quit date: 05/26/1973  ?  Years since quitting: 47.9  ? Smokeless tobacco: Never  ? Tobacco comments:  ?  Former smoker 11/17/2020  ?Vaping Use  ? Vaping Use: Never used  ?Substance and Sexual Activity  ? Alcohol use: Yes  ?  Alcohol/week: 1.0 standard drink  ?  Types: 1 Glasses of wine per week  ?  Comment: 4 x's a wk a  glass of wine  ? Drug use: Never  ? Sexual activity: Not on file  ?Other Topics Concern  ? Not on file  ?Social History Narrative  ? Not on file  ? ?Social Determinants of Health  ? ?Financial Resource Strain: Low Risk   ? Difficulty of Paying Living Expenses: Not hard at all  ?Food Insecurity: No Food Insecurity  ? Worried About Charity fundraiser in the Last Year: Never true  ? Ran Out of Food in the Last Year: Never true  ?Transportation Needs: No Transportation Needs  ? Lack of Transportation (Medical): No  ? Lack of Transportation (Non-Medical): No  ?Physical Activity: Sufficiently Active  ? Days of Exercise per Week: 4 days  ? Minutes of Exercise per Session: 60 min  ?Stress: No Stress Concern Present  ? Feeling of Stress : Not at  all  ?Social Connections: Moderately Integrated  ? Frequency of Communication with Friends and Family: More than three times a week  ? Frequency of Social Gatherings with Friends and Family: More than three times a week  ? Attends Religious Services: Never  ? Active Member of Clubs or Organizations: Yes  ? Attends Archivist Meetings: More than 4 times per year  ? Marital Status: Married  ?  ? ?Family History: ?The patient's family history includes Cancer in her father, mother, and sister; Heart disease in her father; Heart disease (age of onset: 52) in her mother; Stroke in her father and sister. ? ?ROS:   ?Please see the history of present illness.    ?(+) Palpitations ?All other systems reviewed and are negative. ? ?EKGs/Labs/Other Studies Reviewed:   ? ?The following studies were reviewed today: ? ?Monitor 04/2021: ?Atrial fibrillation 31% of recording with avg. HR of 88 BPM ?Occasional PAC's, rare PVCs ?No pauses ?Continue with Xarelto ?  ?Patch Wear Time:  13 days and 6 hours (2023-01-30T23:27:58-0500 to 2023-02-13T06:21:37-0500) ?  ?Patient had a min HR of 45 bpm, max HR of 165 bpm, and avg HR of 68 bpm. Predominant underlying rhythm was Sinus Rhythm. Atrial Fibrillation/Flutter occurred (31% burden), ranging from 53-165 bpm (avg of 88 bpm), the longest lasting 40 mins 57 secs with  ?an avg rate of 71 bpm. Atrial Flutter may be possible Atrial Tachycardia with variable block. Atrial Fibrillation/Flutter was detected within +/- 45 seconds of symptomatic patient event(s). Isolated SVEs were occasional (2.1%, [id number]), SVE Couplets were  ?occasional (1.5%, 7521), and SVE Triplets were occasional (1.4%, 4655). Isolated VEs were rare (<1.0%), VE Couplets were rare (<1.0%), and no VE Triplets were present. ? ?EKG:   EKG is personally reviewed.  ?05/05/2021: EKG was not ordered. ? ? ?Recent Labs: ?03/09/2021: ALT 25; BUN 15; Creatinine, Ser 0.79; Hemoglobin 14.7; Platelets 256.0; Potassium 4.4; Sodium 138; TSH  1.83  ? ?Recent Lipid Panel ?   ?Component Value Date/Time  ? CHOL 207 (H) 03/09/2021 0959  ? CHOL 189 06/27/2019 0844  ? TRIG 86.0 03/09/2021 0959  ? HDL 55.40 03/09/2021 0959  ? HDL 63 06/27/2019 0844  ? CHOLHDL 4 03/09/2021 0959  ? VLDL 17.2 03/09/2021 0959  ? [place] 135 (H) 03/09/2021 0959  ? [place] 113 (H) 06/27/2019 0844  ? LDLDIRECT 142.6 03/04/2010 1001  ? ? ?Physical Exam:   ? ?VS:  BP 114/66   Pulse 69   Ht '5\' 5"'$  (1.651 m)   Wt 117 lb 3.2 oz (53.2 kg)   LMP  (LMP Unknown)   SpO2 98%   BMI 19.50 kg/m?    ? ?  Wt Readings from Last 3 Encounters:  ?05/05/21 117 lb 3.2 oz (53.2 kg)  ?03/19/21 114 lb (51.7 kg)  ?03/09/21 114 lb 3.2 oz (51.8 kg)  ?  ? ?GEN: Well nourished, well developed in no acute distress ?HEENT: Normal ?NECK: No JVD; No carotid bruits ?LYMPHATICS: No lymphadenopathy ?CARDIAC: RRR, no murmurs, rubs, gallops ?RESPIRATORY:  Clear to auscultation without rales, wheezing or rhonchi  ?ABDOMEN: Soft, non-tender, non-distended ?MUSCULOSKELETAL:  No edema; No deformity  ?SKIN: Warm and dry ?NEUROLOGIC:  Alert and oriented x 3 ?PSYCHIATRIC:  Normal affect  ? ? ?  ? ?ASSESSMENT:   ? ?1. Paroxysmal atrial fibrillation (HCC)   ?2. Atherosclerosis of both carotid arteries   ? ?PLAN:   ? ?In order of problems listed above: ? ?#Paroxysmal atrial fibrillation ?Symptomatic.  On Xarelto for stroke prophylaxis.  I discussed the treatment options available for her atrial fibrillation during today's appointment including a continued watchful waiting approach, antiarrhythmic drug therapy and catheter ablation.  I do think she is a good candidate for catheter ablation.  I discussed the catheter ablation procedure in detail with the patient including the risks, recovery and likelihood of success.  I discussed the potential need for repeat ablations and antiarrhythmic drug therapy after an initial ablation attempt.  The patient would like to think about her options with her family which is very reasonable and  she will let us know how she would like to proceed. ? ?Risk, benefits, and alternatives to EP study and radiofrequency ablation for afib were also discussed in detail today. These risks include but are not

## 2021-05-07 ENCOUNTER — Telehealth: Payer: Self-pay | Admitting: Cardiology

## 2021-05-07 DIAGNOSIS — I4891 Unspecified atrial fibrillation: Secondary | ICD-10-CM

## 2021-05-07 DIAGNOSIS — Z01818 Encounter for other preprocedural examination: Secondary | ICD-10-CM

## 2021-05-07 NOTE — Telephone Encounter (Signed)
Patient is called to schedule her ablation. Patient would like to schedule it for the 22nd of May if still available.  ?

## 2021-05-07 NOTE — Telephone Encounter (Signed)
Patient picked ablation date of June 6 and pre op lab date of May 15. Will call patient back with instructions once work up is completed.  ?

## 2021-05-18 ENCOUNTER — Encounter: Payer: Self-pay | Admitting: *Deleted

## 2021-05-18 MED ORDER — PREDNISONE 50 MG PO TABS: ORAL_TABLET | ORAL | 0 refills | Status: DC

## 2021-05-21 NOTE — Telephone Encounter (Signed)
Went over CT and ablation instructions with the patient. Answered all questions. Patient verbalized understanding.  ?

## 2021-05-22 ENCOUNTER — Telehealth: Payer: Self-pay | Admitting: *Deleted

## 2021-05-22 NOTE — Telephone Encounter (Signed)
? ?  Pre-operative Risk Assessment  ?  ?Patient Name: Tammy [name]  ?DOB: January 04, 1951 ?MRN: [id number]  ? ?  ? ?Request for Surgical Clearance   ? ?Procedure:   COLONOSCOPY ? ?Date of Surgery:  Clearance 08/31/21                              ?   ?Surgeon:  DR. Michail Sermon ?Surgeon's Group or Practice Name:  [facility] ?Phone number:  [phone] ?Fax number:  [phone] ?  ?Type of Clearance Requested:   ?- Medical  ?- Pharmacy:  Hold Rivaroxaban (Xarelto)   ?  ?Type of Anesthesia:   PROPOFOL ?  ?Additional requests/questions:   ? ?Signed, ?Julaine Hua   ?05/22/2021, 5:24 PM  ? ?

## 2021-05-25 ENCOUNTER — Ambulatory Visit (INDEPENDENT_AMBULATORY_CARE_PROVIDER_SITE_OTHER): Payer: PPO

## 2021-05-25 VITALS — Ht 65.0 in | Wt 117.0 lb

## 2021-05-25 DIAGNOSIS — Z0000 Encounter for general adult medical examination without abnormal findings: Secondary | ICD-10-CM | POA: Diagnosis not present

## 2021-05-25 NOTE — Patient Instructions (Addendum)
?Ms. Gibeault , ?Thank you for taking time to come for your Medicare Wellness Visit. I appreciate your ongoing commitment to your health goals. Please review the following plan we discussed and let me know if I can assist you in the future.  ? ?These are the goals we discussed: ? Goals   ? ?   DIET - INCREASE LEAN PROTEINS   ?   Low cholesterol ?  ?   Increase physical activity (pt-stated)   ?   Would like to start walking more. ?  ? ?  ?  ?This is a list of the screening recommended for you and due dates:  ?Health Maintenance  ?Topic Date Due  ? COVID-19 Vaccine (6 - Booster) 06/10/2021*  ? Zoster (Shingles) Vaccine (1 of 2) 08/24/2021*  ? Colon Cancer Screening  06/17/2021  ? Flu Shot  09/22/2021  ? Mammogram  03/02/2022  ? Tetanus Vaccine  04/30/2024  ? Pneumonia Vaccine  Completed  ? DEXA scan (bone density measurement)  Completed  ? Hepatitis C Screening: USPSTF Recommendation to screen - Ages 44-79 yo.  Completed  ? HPV Vaccine  Aged Out  ?*Topic was postponed. The date shown is not the original due date.  ?  ?Advanced directives: Yes ? ?Conditions/risks identified: None ? ?Next appointment: Follow up in one year for your annual wellness visit  ? ? ?Preventive Care 30 Years and Older, Female ?Preventive care refers to lifestyle choices and visits with your health care provider that can promote health and wellness. ?What does preventive care include? ?A yearly physical exam. This is also called an annual well check. ?Dental exams once or twice a year. ?Routine eye exams. Ask your health care provider how often you should have your eyes checked. ?Personal lifestyle choices, including: ?Daily care of your teeth and gums. ?Regular physical activity. ?Eating a healthy diet. ?Avoiding tobacco and drug use. ?Limiting alcohol use. ?Practicing safe sex. ?Taking low-dose aspirin every day. ?Taking vitamin and mineral supplements as recommended by your health care provider. ?What happens during an annual well check? ?The  services and screenings done by your health care provider during your annual well check will depend on your age, overall health, lifestyle risk factors, and family history of disease. ?Counseling  ?Your health care provider may ask you questions about your: ?Alcohol use. ?Tobacco use. ?Drug use. ?Emotional well-being. ?Home and relationship well-being. ?Sexual activity. ?Eating habits. ?History of falls. ?Memory and ability to understand (cognition). ?Work and work Statistician. ?Reproductive health. ?Screening  ?You may have the following tests or measurements: ?Height, weight, and BMI. ?Blood pressure. ?Lipid and cholesterol levels. These may be checked every 5 years, or more frequently if you are over 45 years old. ?Skin check. ?Lung cancer screening. You may have this screening every year starting at age 17 if you have a 30-pack-year history of smoking and currently smoke or have quit within the past 15 years. ?Fecal occult blood test (FOBT) of the stool. You may have this test every year starting at age 73. ?Flexible sigmoidoscopy or colonoscopy. You may have a sigmoidoscopy every 5 years or a colonoscopy every 10 years starting at age 31. ?Hepatitis C blood test. ?Hepatitis B blood test. ?Sexually transmitted disease (STD) testing. ?Diabetes screening. This is done by checking your blood sugar (glucose) after you have not eaten for a while (fasting). You may have this done every 1-3 years. ?Bone density scan. This is done to screen for osteoporosis. You may have this done starting at age  65. ?Mammogram. This may be done every 1-2 years. Talk to your health care provider about how often you should have regular mammograms. ?Talk with your health care provider about your test results, treatment options, and if necessary, the need for more tests. ?Vaccines  ?Your health care provider may recommend certain vaccines, such as: ?Influenza vaccine. This is recommended every year. ?Tetanus, diphtheria, and acellular  pertussis (Tdap, Td) vaccine. You may need a Td booster every 10 years. ?Zoster vaccine. You may need this after age 40. ?Pneumococcal 13-valent conjugate (PCV13) vaccine. One dose is recommended after age 47. ?Pneumococcal polysaccharide (PPSV23) vaccine. One dose is recommended after age 59. ?Talk to your health care provider about which screenings and vaccines you need and how often you need them. ?This information is not intended to replace advice given to you by your health care provider. Make sure you discuss any questions you have with your health care provider. ?Document Released: 03/07/2015 Document Revised: 10/29/2015 Document Reviewed: 12/10/2014 ?Elsevier Interactive Patient Education ? 2017 [place]. ? ?Fall Prevention in the Home ?Falls can cause injuries. They can happen to people of all ages. There are many things you can do to make your home safe and to help prevent falls. ?What can I do on the outside of my home? ?Regularly fix the edges of walkways and driveways and fix any cracks. ?Remove anything that might make you trip as you walk through a door, such as a raised step or threshold. ?Trim any bushes or trees on the path to your home. ?Use bright outdoor lighting. ?Clear any walking paths of anything that might make someone trip, such as rocks or tools. ?Regularly check to see if handrails are loose or broken. Make sure that both sides of any steps have handrails. ?Any raised decks and porches should have guardrails on the edges. ?Have any leaves, snow, or ice cleared regularly. ?Use sand or salt on walking paths during winter. ?Clean up any spills in your garage right away. This includes oil or grease spills. ?What can I do in the bathroom? ?Use night lights. ?Install grab bars by the toilet and in the tub and shower. Do not use towel bars as grab bars. ?Use non-skid mats or decals in the tub or shower. ?If you need to sit down in the shower, use a plastic, non-slip stool. ?Keep the floor  dry. Clean up any water that spills on the floor as soon as it happens. ?Remove soap buildup in the tub or shower regularly. ?Attach bath mats securely with double-sided non-slip rug tape. ?Do not have throw rugs and other things on the floor that can make you trip. ?What can I do in the bedroom? ?Use night lights. ?Make sure that you have a light by your bed that is easy to reach. ?Do not use any sheets or blankets that are too big for your bed. They should not hang down onto the floor. ?Have a firm chair that has side arms. You can use this for support while you get dressed. ?Do not have throw rugs and other things on the floor that can make you trip. ?What can I do in the kitchen? ?Clean up any spills right away. ?Avoid walking on wet floors. ?Keep items that you use a lot in easy-to-reach places. ?If you need to reach something above you, use a strong step stool that has a grab bar. ?Keep electrical cords out of the way. ?Do not use floor polish or wax that makes floors slippery.  If you must use wax, use non-skid floor wax. ?Do not have throw rugs and other things on the floor that can make you trip. ?What can I do with my stairs? ?Do not leave any items on the stairs. ?Make sure that there are handrails on both sides of the stairs and use them. Fix handrails that are broken or loose. Make sure that handrails are as long as the stairways. ?Check any carpeting to make sure that it is firmly attached to the stairs. Fix any carpet that is loose or worn. ?Avoid having throw rugs at the top or bottom of the stairs. If you do have throw rugs, attach them to the floor with carpet tape. ?Make sure that you have a light switch at the top of the stairs and the bottom of the stairs. If you do not have them, ask someone to add them for you. ?What else can I do to help prevent falls? ?Wear shoes that: ?Do not have high heels. ?Have rubber bottoms. ?Are comfortable and fit you well. ?Are closed at the toe. Do not wear  sandals. ?If you use a stepladder: ?Make sure that it is fully opened. Do not climb a closed stepladder. ?Make sure that both sides of the stepladder are locked into place. ?Ask someone to hold it for you, if possi

## 2021-05-25 NOTE — Progress Notes (Signed)
? ?Subjective:  ? Tammy [name] is a 71 y.o. female who presents for Medicare Annual (Subsequent) preventive examination. ? ?Review of Systems    ?Virtual Visit via Telephone Note ? ?I connected with  Tammy [name] on 05/25/21 at  3:15 PM EDT by telephone and verified that I am speaking with the correct person using two identifiers. ? ?Location: ?Patient: Home ?Provider: Office ?Persons participating in the virtual visit: patient/Nurse Health Advisor ?  ?I discussed the limitations, risks, security and privacy concerns of performing an evaluation and management service by telephone and the availability of in person appointments. The patient expressed understanding and agreed to proceed. ? ?Interactive audio and video telecommunications were attempted between this nurse and patient, however failed, due to patient having technical difficulties OR patient did not have access to video capability.  We continued and completed visit with audio only. ? ?Some vital signs may be absent or patient reported.  ? ?Criselda Peaches, LPN  ?Cardiac Risk Factors include: advanced age (>67mn, >>49women) ? ?   ?Objective:  ?  ?Today's Vitals  ? 05/25/21 1521  ?Weight: 117 lb (53.1 kg)  ?Height: '5\' 5"'$  (1.651 m)  ? ?Body mass index is 19.47 kg/m?. ? ? ?  05/25/2021  ?  3:32 PM 05/16/2020  ?  1:27 PM 01/20/2016  ?  8:44 PM 02/15/2015  ?  3:32 PM  ?Advanced Directives  ?Does Patient Have a Medical Advance Directive? Yes Yes Yes Yes  ?Type of AParamedicof ADerryLiving will HCreteLiving will HMuseLiving will   ?Does patient want to make changes to medical advance directive? No - Patient declined No - Patient declined    ?Copy of HHomelandin Chart? No - copy requested No - copy requested    ? ? ?Current Medications (verified) ?Outpatient Encounter Medications as of 05/25/2021  ?Medication Sig  ? BORON PO Take 1.5 mg by mouth in the morning  and at bedtime.  ? Calcium Carbonate-Vitamin D (CALCIUM-D PO)   ? Emollient (AMBI EVEN & CLEAR SPF 30 EX) apply  ? MAGNESIUM PO Take 175 mg by mouth in the morning and at bedtime.  ? metoprolol succinate (TOPROL-XL) 25 MG 24 hr tablet Take 0.5 tablets (12.5 mg total) by mouth 2 (two) times daily.  ? metoprolol tartrate (LOPRESSOR) 25 MG tablet Take 1 tablet (25 mg total) by mouth as needed.  ? Multiple Vitamin (MULTIVITAMIN) tablet Algaecal- Taking 2 tablets by mouth in the am and 2 tablets at dinner  ? predniSONE (DELTASONE) 50 MG tablet For Cardiac CT: Prednisone 50 mg - take 13 hours prior to test Take another Prednisone 50 mg 7 hours prior to test Take another Prednisone 50 mg 1 hour prior to test Take Benadryl 50 mg 1 hour prior to test  ? PREVIDENT 5000 BOOSTER PLUS 1.1 % PSTE Place onto teeth.  ? rivaroxaban (XARELTO) 20 MG TABS tablet TAKE 1 TABLET(20 MG) BY MOUTH DAILY WITH SUPPER  ? Strontium Chloride CRYS Taking 2 tablets by mouth at bedtime  ? ?No facility-administered encounter medications on file as of 05/25/2021.  ? ? ?Allergies (verified) ?Penicillins, Codeine, and Contrast media [iodinated contrast media]  ? ?History: ?Past Medical History:  ?Diagnosis Date  ? Hyperlipidemia   ? ?Past Surgical History:  ?Procedure Laterality Date  ? DILATION AND CURETTAGE OF UTERUS N/A   ? ?Family History  ?Problem Relation Age of Onset  ? Heart disease Mother  80  ?     CABG  ? Cancer Mother   ?     bladder cancer  ? Heart disease Father   ?     MVP, atrial fibrilation  ? Cancer Father   ?     colon cancer  ? Stroke Father   ? Cancer Sister   ?     colon cancer  ? Stroke Sister   ? ?Social History  ? ?Socioeconomic History  ? Marital status: Married  ?  Spouse name: Not on file  ? Number of children: Not on file  ? Years of education: Not on file  ? Highest education level: Not on file  ?Occupational History  ? Not on file  ?Tobacco Use  ? Smoking status: Former  ?  Packs/day: 1.00  ?  Years: 5.00  ?  Pack years: 5.00   ?  Types: Cigarettes  ?  Quit date: 05/26/1973  ?  Years since quitting: 48.0  ? Smokeless tobacco: Never  ? Tobacco comments:  ?  Former smoker 11/17/2020  ?Vaping Use  ? Vaping Use: Never used  ?Substance and Sexual Activity  ? Alcohol use: Yes  ?  Alcohol/week: 1.0 standard drink  ?  Types: 1 Glasses of wine per week  ?  Comment: 4 x's a wk a glass of wine  ? Drug use: Never  ? Sexual activity: Not on file  ?Other Topics Concern  ? Not on file  ?Social History Narrative  ? Not on file  ? ?Social Determinants of Health  ? ?Financial Resource Strain: Low Risk   ? Difficulty of Paying Living Expenses: Not hard at all  ?Food Insecurity: No Food Insecurity  ? Worried About Charity fundraiser in the Last Year: Never true  ? Ran Out of Food in the Last Year: Never true  ?Transportation Needs: No Transportation Needs  ? Lack of Transportation (Medical): No  ? Lack of Transportation (Non-Medical): No  ?Physical Activity: Sufficiently Active  ? Days of Exercise per Week: 4 days  ? Minutes of Exercise per Session: 70 min  ?Stress: No Stress Concern Present  ? Feeling of Stress : Only a little  ?Social Connections: Socially Integrated  ? Frequency of Communication with Friends and Family: More than three times a week  ? Frequency of Social Gatherings with Friends and Family: Three times a week  ? Attends Religious Services: More than 4 times per year  ? Active Member of Clubs or Organizations: Yes  ? Attends Archivist Meetings: More than 4 times per year  ? Marital Status: Married  ? ? ? ?Clinical Intake: ? ?Pre-visit preparation completed: Yes ? ?How often do you need to have someone help you when you read instructions, pamphlets, or other written materials from your doctor or pharmacy?: 1 - Never ? ?Diabetic?  No ? ? ?Activities of Daily Living ? ?  05/25/2021  ?  3:29 PM 05/23/2021  ?  8:19 PM  ?In your present state of health, do you have any difficulty performing the following activities:  ?Hearing? 0 0  ?Vision?  0 0  ?Difficulty concentrating or making decisions? 0 0  ?Walking or climbing stairs? 0 0  ?Dressing or bathing? 0 0  ?Doing errands, shopping? 0 0  ?Preparing Food and eating ? N N  ?Using the Toilet? N N  ?In the past six months, have you accidently leaked urine? N Y  ?Do you have problems with loss of bowel  control? N N  ?Managing your Medications? N N  ?Managing your Finances? N N  ?Housekeeping or managing your Housekeeping? N N  ? ? ?Patient Care Team: ?Eulas Post, MD as PCP - General ?Jerline Pain, MD as PCP - Cardiology (Cardiology) ?Vickie Epley, MD as PCP - Electrophysiology (Cardiology) ? ?Indicate any recent Medical Services you may have received from other than [facility] providers in the past year (date may be approximate). ? ?   ?Assessment:  ? This is a routine wellness examination for Tammy [name]. ? ?Hearing/Vision screen ?Hearing Screening - Comments:: No hearing difficulty ?Vision Screening - Comments:: Wears reading glasses. Followed by Dr Nicki Reaper ? ?Dietary issues and exercise activities discussed: ?Exercise limited by: None identified ? ? Goals Addressed   ? ?  ?  ?  ?  ?  ? This Visit's Progress  ?   Increase physical activity (pt-stated)     ?   Would like to start walking more. ?  ? ?  ? ?Depression Screen ? ?  05/25/2021  ?  3:25 PM 03/09/2021  ?  9:51 AM 05/16/2020  ?  1:30 PM 01/26/2019  ?  9:00 AM 01/09/2018  ?  1:18 PM 02/27/2016  ? 10:46 AM 01/31/2015  ? 11:09 AM  ?PHQ 2/9 Scores  ?PHQ - 2 Score 0 0 0 0 0 0 0  ?PHQ- 9 Score    0     ?  ?Fall Risk ? ?  05/25/2021  ?  3:30 PM 05/23/2021  ?  8:19 PM 03/09/2021  ?  9:51 AM 05/16/2020  ?  1:28 PM 09/18/2019  ?  2:49 PM  ?Fall Risk   ?Falls in the past year? 0 0 1 0 0  ?Comment     Emmi Telephone Survey: data to providers prior to load  ?Number falls in past yr: 0  0 0   ?Injury with Fall? 0 0 0 0   ?Risk for fall due to : No Fall Risks      ?Follow up    Falls evaluation completed   ? ? ?FALL RISK PREVENTION PERTAINING TO THE HOME: ? ?Any stairs in or  around the home? Yes  ?If so, are there any without handrails? No  ?Home free of loose throw rugs in walkways, pet beds, electrical cords, etc? Yes  ?Adequate lighting in your home to reduce risk of

## 2021-05-26 NOTE — Telephone Encounter (Signed)
Please find out if the procedure is urgent or elective.  Patient has upcoming atrial fibrillation ablation procedure on 07/28/2021, after which, patient likely will need uninterrupted Xarelto therapy for 3 months.  If GI procedure is urgent, then we will need to discuss with Dr. Quentin Ore. ?

## 2021-05-26 NOTE — Telephone Encounter (Signed)
Clinical pharmacist to review Xarelto 

## 2021-05-27 NOTE — Telephone Encounter (Signed)
Patient with diagnosis of atrial fibrillation on Xarelto for anticoagulation.   ? ?Procedure: colonoscopy ?Date of procedure: 08/31/21 ? ? ?CHA2DS2-VASc Score = 2  ? This indicates a 2.2% annual risk of stroke. ?The patient's score is based upon: ?CHF History: 0 ?HTN History: 0 ?Diabetes History: 0 ?Stroke History: 0 ?Vascular Disease History: 0 ?Age Score: 1 ?Gender Score: 1 ?  ?   ? ?CrCl 55  ?Platelet count 256 ? ?Per office protocol, patient can hold Xarelto for 2 days prior to procedure.   ?Patient will not need bridging with Lovenox (enoxaparin) around procedure. ? ?See note from [facility].  If patient is to have AF ablation, then colonoscopy will need to be held off until mid-September. ? ?

## 2021-05-28 NOTE — Telephone Encounter (Signed)
I tried to reach requesting office to ask about urgency. No answer, VR system just kept going around. I will fax these notes to Dr. Michail Sermon to please see notes from Almyra Deforest, [facility].  ?

## 2021-06-01 NOTE — Telephone Encounter (Signed)
Please made certain patient is aware and agreeable to postponing procedure.  ?

## 2021-06-01 NOTE — Telephone Encounter (Signed)
Call placed to pt, left a detailed message regarding procedures being postponed and if pt had any questions, she could call back and ask for the preop team. ? ?Barb from [facility] was going to place call to the pt as well.  ?

## 2021-06-01 NOTE — Telephone Encounter (Signed)
Spoke with [facility], pt's Colonoscopy is screening with family history, but she said pt could push it out since she has Ablation scheduled. ? ? ?

## 2021-06-16 ENCOUNTER — Ambulatory Visit (HOSPITAL_COMMUNITY)
Admission: RE | Admit: 2021-06-16 | Discharge: 2021-06-16 | Disposition: A | Discharge status: Home / Self Care | Payer: PPO | Source: Ambulatory Visit | Attending: Physician Assistant | Admitting: Physician Assistant

## 2021-06-16 ENCOUNTER — Encounter (HOSPITAL_COMMUNITY): Payer: Self-pay | Admitting: Physician Assistant

## 2021-06-16 VITALS — BP 124/76 | HR 78 | Ht 65.0 in | Wt 116.6 lb

## 2021-06-16 DIAGNOSIS — I480 Paroxysmal atrial fibrillation: Secondary | ICD-10-CM | POA: Insufficient documentation

## 2021-06-16 DIAGNOSIS — Z7901 Long term (current) use of anticoagulants: Secondary | ICD-10-CM | POA: Insufficient documentation

## 2021-06-16 DIAGNOSIS — E785 Hyperlipidemia, unspecified: Secondary | ICD-10-CM | POA: Diagnosis not present

## 2021-06-16 DIAGNOSIS — D6869 Other thrombophilia: Secondary | ICD-10-CM | POA: Diagnosis not present

## 2021-06-16 DIAGNOSIS — I6529 Occlusion and stenosis of unspecified carotid artery: Secondary | ICD-10-CM | POA: Diagnosis not present

## 2021-06-16 DIAGNOSIS — Z79899 Other long term (current) drug therapy: Secondary | ICD-10-CM | POA: Diagnosis not present

## 2021-06-16 DIAGNOSIS — I471 Supraventricular tachycardia: Secondary | ICD-10-CM | POA: Insufficient documentation

## 2021-06-16 NOTE — Progress Notes (Signed)
? ? ?Primary Care Physician: Eulas Post, MD ?Primary Cardiologist: Dr Marlou Porch ?Primary Electrophysiologist: Dr Quentin Ore ?Referring Physician: Dr Marlou Porch ? ? ?Tammy [name] is a 71 y.o. female with a history of HLD, carotid artery stenosis, atrial fibrillation, atrial tachycardia who presents for follow up in the [facility]. Patient is on Xarelto for a CHADS2VASC score of 3. She called [facility] 07/10/20 with symptoms of tachypalpitations. She took PRN BB and converted to SR after ~20 hours. She then started daily metoprolol to help maintain SR.  ? ?On follow up today, patient reports she continues to have paroxysmal episodes of afib, fortunately brief episodes. She has seen Dr Quentin Ore and is scheduled for afib ablation 07/28/21. No bleeding issues on anticoagulation.  ? ?Today, she denies symptoms of palpitations, chest pain, shortness of breath, orthopnea, PND, lower extremity edema, dizziness, presyncope, syncope, snoring, daytime somnolence, bleeding, or neurologic sequela. The patient is tolerating medications without difficulties and is otherwise without complaint today.  ? ? ?Atrial Fibrillation Risk Factors: ? ?she does not have symptoms or diagnosis of sleep apnea. ?she does not have a history of rheumatic fever. ? ? ?she has a BMI of Body mass index is 19.4 kg/m?Marland KitchenMarland Kitchen ?Filed Weights  ? 06/16/21 1059  ?Weight: 52.9 kg  ? ? ?Family History  ?Problem Relation Age of Onset  ? Heart disease Mother 73  ?     CABG  ? Cancer Mother   ?     bladder cancer  ? Heart disease Father   ?     MVP, atrial fibrilation  ? Cancer Father   ?     colon cancer  ? Stroke Father   ? Cancer Sister   ?     colon cancer  ? Stroke Sister   ? ? ? ?Atrial Fibrillation Management history: ? ?Previous antiarrhythmic drugs: none ?Previous cardioversions: none ?Previous ablations: none ?CHADS2VASC score: 3 ?Anticoagulation history: Xarelto ? ? ?Past Medical History:  ?Diagnosis Date  ? Hyperlipidemia    ? ?Past Surgical History:  ?Procedure Laterality Date  ? DILATION AND CURETTAGE OF UTERUS N/A   ? ? ?Current Outpatient Medications  ?Medication Sig Dispense Refill  ? Calcium Carbonate-Vitamin D (CALCIUM-D PO)     ? metoprolol succinate (TOPROL-XL) 25 MG 24 hr tablet Take 0.5 tablets (12.5 mg total) by mouth 2 (two) times daily. 90 tablet 3  ? metoprolol tartrate (LOPRESSOR) 25 MG tablet Take 1 tablet (25 mg total) by mouth as needed. 30 tablet 11  ? Multiple Vitamin (MULTIVITAMIN) tablet Algaecal- Taking 2 tablets by mouth in the am and 2 tablets at dinner    ? predniSONE (DELTASONE) 50 MG tablet For Cardiac CT: Prednisone 50 mg - take 13 hours prior to test Take another Prednisone 50 mg 7 hours prior to test Take another Prednisone 50 mg 1 hour prior to test Take Benadryl 50 mg 1 hour prior to test 3 tablet 0  ? PREVIDENT 5000 BOOSTER PLUS 1.1 % PSTE Place onto teeth.    ? rivaroxaban (XARELTO) 20 MG TABS tablet TAKE 1 TABLET(20 MG) BY MOUTH DAILY WITH SUPPER 90 tablet 1  ? ?No current facility-administered medications for this encounter.  ? ? ?Allergies  ?Allergen Reactions  ? Penicillins Rash  ? Codeine Other (See Comments)  ?  Pt. States "crazy dreams"  ? Contrast Media [Iodinated Contrast Media] Itching  ?  Eye Swelling  ? ? ?Social History  ? ?Socioeconomic History  ? Marital status: Married  ?  Spouse name: Not on file  ? Number of children: Not on file  ? Years of education: Not on file  ? Highest education level: Not on file  ?Occupational History  ? Not on file  ?Tobacco Use  ? Smoking status: Former  ?  Packs/day: 1.00  ?  Years: 5.00  ?  Pack years: 5.00  ?  Types: Cigarettes  ?  Quit date: 05/26/1973  ?  Years since quitting: 48.0  ? Smokeless tobacco: Never  ? Tobacco comments:  ?  Former smoker 11/17/2020  ?Vaping Use  ? Vaping Use: Never used  ?Substance and Sexual Activity  ? Alcohol use: Not Currently  ?  Alcohol/week: 1.0 standard drink  ?  Types: 1 Glasses of wine per week  ?  Comment: stopped  drinking 06/16/21  ? Drug use: Never  ? Sexual activity: Not on file  ?Other Topics Concern  ? Not on file  ?Social History Narrative  ? Not on file  ? ?Social Determinants of Health  ? ?Financial Resource Strain: Low Risk   ? Difficulty of Paying Living Expenses: Not hard at all  ?Food Insecurity: No Food Insecurity  ? Worried About Charity fundraiser in the Last Year: Never true  ? Ran Out of Food in the Last Year: Never true  ?Transportation Needs: No Transportation Needs  ? Lack of Transportation (Medical): No  ? Lack of Transportation (Non-Medical): No  ?Physical Activity: Sufficiently Active  ? Days of Exercise per Week: 4 days  ? Minutes of Exercise per Session: 70 min  ?Stress: No Stress Concern Present  ? Feeling of Stress : Only a little  ?Social Connections: Socially Integrated  ? Frequency of Communication with Friends and Family: More than three times a week  ? Frequency of Social Gatherings with Friends and Family: Three times a week  ? Attends Religious Services: More than 4 times per year  ? Active Member of Clubs or Organizations: Yes  ? Attends Archivist Meetings: More than 4 times per year  ? Marital Status: Married  ?Intimate Partner Violence: Not At Risk  ? Fear of Current or Ex-Partner: No  ? Emotionally Abused: No  ? Physically Abused: No  ? Sexually Abused: No  ? ? ? ?ROS- All systems are reviewed and negative except as per the HPI above. ? ?Physical Exam: ?Vitals:  ? 06/16/21 1059  ?BP: 124/76  ?Pulse: 78  ?Weight: 52.9 kg  ?Height: '5\' 5"'$  (1.651 m)  ? ? ? ?GEN- The patient is a well appearing female, alert and oriented x 3 today.   ?HEENT-head normocephalic, atraumatic, sclera clear, conjunctiva pink, hearing intact, trachea midline. ?Lungs- Clear to ausculation bilaterally, normal work of breathing ?Heart- Regular rate and rhythm, occasional ectopic beat, no murmurs, rubs or gallops  ?GI- soft, NT, ND, + BS ?Extremities- no clubbing, cyanosis, or edema ?MS- no significant  deformity or atrophy ?Skin- no rash or lesion ?Psych- euthymic mood, full affect ?Neuro- strength and sensation are intact ? ? ?Wt Readings from Last 3 Encounters:  ?06/16/21 52.9 kg  ?05/25/21 53.1 kg  ?05/05/21 53.2 kg  ? ? ?EKG today demonstrates  ?SR, frequent PACs ?Vent. rate 78 BPM ?PR interval 152 ms ?QRS duration 82 ms ?QT/QTcB 358/408 ms ? ?Echo 02/19/16 demonstrated  ?- Left ventricle: The cavity size was normal. Systolic function was  ?  normal. The estimated ejection fraction was in the range of 55%  ?  to 60%. Wall motion was normal;  there were no regional wall  ?  motion abnormalities. Left ventricular diastolic function  ?  parameters were normal.  ?- Pericardium, extracardiac: A trivial pericardial effusion was  ?  identified.  ? ?Epic records are reviewed at length today ? ?CHA2DS2-VASc Score = 3  ?The patient's score is based upon: ?CHF History: 0 ?HTN History: 0 ?Diabetes History: 0 ?Stroke History: 0 ?Vascular Disease History: 1 (carotid artery disease) ?Age Score: 1 ?Gender Score: 1 ?    ? ? ?ASSESSMENT AND PLAN: ?1. Paroxysmal Atrial Fibrillation/atrial tachycardia  ?The patient's CHA2DS2-VASc score is 3, indicating a 3.2% annual risk of stroke.   ?Patient is scheduled for ablation 07/28/21. We discussed the procedure in detail today, her questions were answered to her satisfaction. She is still agreeable to proceed.  ?Continue Toprol 12.5 mg daily with Lopressor 25 mg q 6 hours for heart racing. ?Continue Xarelto 20 mg daily ? ?2. Secondary Hypercoagulable State (ICD10:  D68.69) ?The patient is at significant risk for stroke/thromboembolism based upon her CHA2DS2-VASc Score of 3.  Continue Rivaroxaban (Xarelto).  ? ? ?Follow up for CT and ablation as scheduled.  ? ? ?Ricky Hazel Leveille PA-C ?[facility] ?[facility] ?[address] ?[place], [place] [address] ?[phone] ?06/16/2021 ?11:11 AM ? ?

## 2021-06-17 ENCOUNTER — Ambulatory Visit (INDEPENDENT_AMBULATORY_CARE_PROVIDER_SITE_OTHER): Payer: PPO | Admitting: Family Medicine

## 2021-06-17 ENCOUNTER — Encounter: Payer: Self-pay | Admitting: Family Medicine

## 2021-06-17 VITALS — BP 100/60 | HR 70 | Temp 97.3°F | Ht 65.0 in | Wt 117.1 lb

## 2021-06-17 DIAGNOSIS — F419 Anxiety disorder, unspecified: Secondary | ICD-10-CM | POA: Diagnosis not present

## 2021-06-17 MED ORDER — SERTRALINE HCL 25 MG PO TABS: 25.0000 mg | ORAL_TABLET | Freq: Every day | ORAL | 3 refills | Status: DC

## 2021-06-17 NOTE — Patient Instructions (Signed)
Give me some feedback in one month if we need to increase the Sertraline further.   ? ? ?

## 2021-06-17 NOTE — Progress Notes (Signed)
? ?Established Patient Office Visit ? ?Subjective   ?Patient ID: Tammy [name], female    DOB: 04/24/1950  Age: 71 y.o. MRN: [id number] ? ?Chief Complaint  ?Patient presents with  ? Anxiety  ? ? ?HPI ? ? ?Tammy [name] is seen with increased anxiety symptoms.  She states that for years she has had tendencies toward anxiety.  Also strong family history of anxiety and multiple siblings as well as her daughters.  Feels generally anxious but certainly more so in anticipation of cardiac ablation June 6.  She states that she becomes very anxious and is thinking about this.  Symptoms are daily.  Denies any depression symptoms.  No history of panic disorder.  No history of social anxiety.  No caffeine use.  No regular alcohol use.  Has not been treated for anxiety previously other than use of lorazepam prior to flying ? ?Her A-fib has become more frequent recently which has led to the decision for ablation. ? ?Past Medical History:  ?Diagnosis Date  ? Hyperlipidemia   ? ?Past Surgical History:  ?Procedure Laterality Date  ? DILATION AND CURETTAGE OF UTERUS N/A   ? ? reports that she quit smoking about 48 years ago. Her smoking use included cigarettes. She has a 5.00 pack-year smoking history. She has never used smokeless tobacco. She reports that she does not currently use alcohol after a past usage of about 1.0 standard drink per week. She reports that she does not use drugs. ?family history includes Cancer in her father, mother, and sister; Heart disease in her father; Heart disease (age of onset: 68) in her mother; Stroke in her father and sister. ?Allergies  ?Allergen Reactions  ? Penicillins Rash  ? Codeine Other (See Comments)  ?  Pt. States "crazy dreams"  ? Contrast Media [Iodinated Contrast Media] Itching  ?  Eye Swelling  ? ? ? ?Review of Systems  ?Cardiovascular:  Negative for chest pain.  ?Psychiatric/Behavioral:  Negative for depression. The patient is nervous/anxious.   ? ?  ?Objective:  ?  ? ?BP 100/60 (BP  Location: Left Arm, Patient Position: Sitting, Cuff Size: Normal)   Pulse 70   Temp (!) 97.3 ?F (36.3 ?C) (Oral)   Ht '5\' 5"'$  (1.651 m)   Wt 117 lb 1.6 oz (53.1 kg)   LMP  (LMP Unknown)   SpO2 98%   BMI 19.49 kg/m?  ? ? ?Physical Exam ?Vitals reviewed.  ?Constitutional:   ?   Appearance: Normal appearance.  ?Cardiovascular:  ?   Rate and Rhythm: Normal rate.  ?Pulmonary:  ?   Effort: Pulmonary effort is normal.  ?   Breath sounds: Normal breath sounds.  ?Neurological:  ?   General: No focal deficit present.  ?   Mental Status: She is alert.  ? ? ? ?No results found for any visits on 06/17/21. ? ? ? ?The 10-year ASCVD risk score (Arnett DK, et al., 2019) is: 6.7% ? ?  ?Assessment & Plan:  ? ?Problem List Items Addressed This Visit   ?None ?Visit Diagnoses   ? ? Anxiety-like symptoms    -  Primary  ? ?  ?Progressive anxiety symptoms.  Recent TSH in January normal.  We discussed possible trial of low-dose sertraline 25 mg once daily and give feedback in 3 to 4 weeks.  Could consider further titration if indicated at that time.  Reviewed potential side effects such as nausea. ?-Continue regular exercise and avoidance of caffeine ? ?No follow-ups on file.  ? ? ?  Carolann Littler, MD ? ?

## 2021-06-22 ENCOUNTER — Encounter: Payer: Self-pay | Admitting: Cardiology

## 2021-07-02 ENCOUNTER — Encounter: Payer: Self-pay | Admitting: Cardiology

## 2021-07-02 DIAGNOSIS — Z01818 Encounter for other preprocedural examination: Secondary | ICD-10-CM

## 2021-07-02 DIAGNOSIS — I480 Paroxysmal atrial fibrillation: Secondary | ICD-10-CM

## 2021-07-06 ENCOUNTER — Other Ambulatory Visit: Payer: PPO | Admitting: *Deleted

## 2021-07-06 DIAGNOSIS — I4891 Unspecified atrial fibrillation: Secondary | ICD-10-CM

## 2021-07-06 DIAGNOSIS — Z01818 Encounter for other preprocedural examination: Secondary | ICD-10-CM | POA: Diagnosis not present

## 2021-07-07 LAB — CBC WITH DIFFERENTIAL/PLATELET
Basophils Absolute: 0.1 10*3/uL (ref 0.0–0.2)
Basos: 1 %
EOS (ABSOLUTE): 0.1 10*3/uL (ref 0.0–0.4)
Eos: 1 %
Hematocrit: 42.5 % (ref 34.0–46.6)
Hemoglobin: 14.4 g/dL (ref 11.1–15.9)
Immature Grans (Abs): 0 10*3/uL (ref 0.0–0.1)
Immature Granulocytes: 0 %
Lymphocytes Absolute: 3.3 10*3/uL — ABNORMAL HIGH (ref 0.7–3.1)
Lymphs: 42 %
MCH: 32.1 pg (ref 26.6–33.0)
MCHC: 33.9 g/dL (ref 31.5–35.7)
MCV: 95 fL (ref 79–97)
Monocytes Absolute: 0.7 10*3/uL (ref 0.1–0.9)
Monocytes: 10 %
Neutrophils Absolute: 3.6 10*3/uL (ref 1.4–7.0)
Neutrophils: 46 %
Platelets: 259 10*3/uL (ref 150–450)
RBC: 4.49 x10E6/uL (ref 3.77–5.28)
RDW: 12.6 % (ref 11.7–15.4)
WBC: 7.7 10*3/uL (ref 3.4–10.8)

## 2021-07-07 LAB — BASIC METABOLIC PANEL
BUN/Creatinine Ratio: 20 (ref 12–28)
BUN: 18 mg/dL (ref 8–27)
CO2: 23 mmol/L (ref 20–29)
Calcium: 9 mg/dL (ref 8.7–10.3)
Chloride: 102 mmol/L (ref 96–106)
Creatinine, Ser: 0.91 mg/dL (ref 0.57–1.00)
Glucose: 82 mg/dL (ref 70–99)
Potassium: 4.2 mmol/L (ref 3.5–5.2)
Sodium: 137 mmol/L (ref 134–144)
eGFR: 67 mL/min/{1.73_m2} (ref >59)

## 2021-07-12 ENCOUNTER — Other Ambulatory Visit: Payer: Self-pay | Admitting: Cardiology

## 2021-07-15 ENCOUNTER — Ambulatory Visit (HOSPITAL_COMMUNITY): Payer: PPO | Attending: Cardiovascular Disease

## 2021-07-15 DIAGNOSIS — Z01818 Encounter for other preprocedural examination: Secondary | ICD-10-CM | POA: Insufficient documentation

## 2021-07-15 DIAGNOSIS — I480 Paroxysmal atrial fibrillation: Secondary | ICD-10-CM | POA: Diagnosis not present

## 2021-07-15 LAB — ECHOCARDIOGRAM COMPLETE
Area-P 1/2: 4.58 cm2
MV VTI: 1.71 cm2
S' Lateral: 1.7 cm

## 2021-07-21 ENCOUNTER — Telehealth (HOSPITAL_COMMUNITY): Payer: Self-pay | Admitting: *Deleted

## 2021-07-21 NOTE — Telephone Encounter (Signed)
Reaching out to patient to offer assistance regarding upcoming cardiac imaging study; pt verbalizes understanding of appt date/time, parking situation and where to check in, pre-test NPO status and medications ordered, and verified current allergies; name and call back number provided for further questions should they arise  Gordy Clement RN [facility] [phone] office [phone] cell  Patient verbalized how to understanding to how to take 13 hour prep. She is aware to take a '25mg'$  metoprolol tartrate two hours prior to her cardiac CT if HR greater than 80bpm and to arrive at 1pm.

## 2021-07-21 NOTE — Telephone Encounter (Signed)
Attempted to call patient regarding upcoming cardiac CT appointment. °Left message on voicemail with name and callback number ° °Schwanda Zima RN Navigator Cardiac Imaging °[facility] °[phone] Office °[phone] Cell ° °

## 2021-07-22 ENCOUNTER — Ambulatory Visit (HOSPITAL_COMMUNITY)
Admission: RE | Admit: 2021-07-22 | Discharge: 2021-07-22 | Disposition: A | Discharge status: Home / Self Care | Payer: PPO | Source: Ambulatory Visit | Attending: Cardiology | Admitting: Cardiology

## 2021-07-22 DIAGNOSIS — I4891 Unspecified atrial fibrillation: Secondary | ICD-10-CM | POA: Insufficient documentation

## 2021-07-22 MED ORDER — IOHEXOL 350 MG/ML SOLN
100.0000 mL | Freq: Once | INTRAVENOUS | Status: AC | PRN
  Administered 2021-07-22: 100 mL via INTRAVENOUS

## 2021-07-27 ENCOUNTER — Telehealth: Payer: Self-pay | Admitting: Cardiology

## 2021-07-27 ENCOUNTER — Encounter: Payer: Self-pay | Admitting: Cardiology

## 2021-07-27 NOTE — Pre-Procedure Instructions (Signed)
Instructed patient on the following items: Arrival time 1030 Nothing to eat or drink after midnight No meds AM of procedure Responsible person to drive you home and stay with you for 24 hrs  Have you missed any doses of anti-coagulant-Xarelto- hasn't missed any doses

## 2021-07-27 NOTE — Telephone Encounter (Signed)
Patient could not locate her Ablation instructions in Epic. Walked her through how to find it and answered all her questions.  Patient verbalized understanding and agreement.

## 2021-07-27 NOTE — Telephone Encounter (Signed)
Patient spoke to [facility] in the Cath lab.

## 2021-07-27 NOTE — Telephone Encounter (Signed)
   Pt is requesting to speak with a nurse regarding her procedure tomorrow. She said, she needs more details and instructions for her procedure

## 2021-07-28 ENCOUNTER — Encounter (HOSPITAL_COMMUNITY)
Admission: RE | Disposition: A | Discharge status: Home / Self Care | Payer: PPO | Source: Home / Self Care | Attending: Cardiology

## 2021-07-28 ENCOUNTER — Ambulatory Visit (HOSPITAL_BASED_OUTPATIENT_CLINIC_OR_DEPARTMENT_OTHER): Payer: PPO | Admitting: Anesthesiology

## 2021-07-28 ENCOUNTER — Encounter (HOSPITAL_COMMUNITY): Payer: Self-pay | Admitting: Cardiology

## 2021-07-28 ENCOUNTER — Ambulatory Visit (HOSPITAL_COMMUNITY)
Admission: RE | Admit: 2021-07-28 | Discharge: 2021-07-28 | Disposition: A | Discharge status: Home / Self Care | Payer: PPO | Attending: Cardiology | Admitting: Cardiology

## 2021-07-28 ENCOUNTER — Ambulatory Visit (HOSPITAL_COMMUNITY): Payer: PPO | Admitting: Anesthesiology

## 2021-07-28 DIAGNOSIS — I6523 Occlusion and stenosis of bilateral carotid arteries: Secondary | ICD-10-CM | POA: Insufficient documentation

## 2021-07-28 DIAGNOSIS — E785 Hyperlipidemia, unspecified: Secondary | ICD-10-CM | POA: Diagnosis not present

## 2021-07-28 DIAGNOSIS — Z7901 Long term (current) use of anticoagulants: Secondary | ICD-10-CM | POA: Insufficient documentation

## 2021-07-28 DIAGNOSIS — Z87891 Personal history of nicotine dependence: Secondary | ICD-10-CM | POA: Insufficient documentation

## 2021-07-28 DIAGNOSIS — I10 Essential (primary) hypertension: Secondary | ICD-10-CM

## 2021-07-28 DIAGNOSIS — Z91041 Radiographic dye allergy status: Secondary | ICD-10-CM | POA: Diagnosis not present

## 2021-07-28 DIAGNOSIS — I4891 Unspecified atrial fibrillation: Secondary | ICD-10-CM

## 2021-07-28 DIAGNOSIS — I480 Paroxysmal atrial fibrillation: Secondary | ICD-10-CM | POA: Insufficient documentation

## 2021-07-28 HISTORY — PX: ATRIAL FIBRILLATION ABLATION: EP1191

## 2021-07-28 LAB — POCT ACTIVATED CLOTTING TIME
Activated Clotting Time: 311 seconds
Activated Clotting Time: 311 seconds

## 2021-07-28 SURGERY — ATRIAL FIBRILLATION ABLATION
Anesthesia: General

## 2021-07-28 MED ORDER — FENTANYL CITRATE (PF) 250 MCG/5ML IJ SOLN
INTRAMUSCULAR | Status: DC | PRN
  Administered 2021-07-28: 50 ug via INTRAVENOUS

## 2021-07-28 MED ORDER — ACETAMINOPHEN 500 MG PO TABS
ORAL_TABLET | ORAL | Status: AC
  Filled 2021-07-28: qty 2

## 2021-07-28 MED ORDER — HEPARIN SODIUM (PORCINE) 1000 UNIT/ML IJ SOLN
INTRAMUSCULAR | Status: AC
  Filled 2021-07-28: qty 10

## 2021-07-28 MED ORDER — PANTOPRAZOLE SODIUM 40 MG PO TBEC
40.0000 mg | DELAYED_RELEASE_TABLET | Freq: Every day | ORAL | Status: DC
  Administered 2021-07-28: 40 mg via ORAL
  Filled 2021-07-28: qty 1

## 2021-07-28 MED ORDER — ISOPROTERENOL HCL 0.2 MG/ML IJ SOLN
INTRAVENOUS | Status: DC | PRN
  Administered 2021-07-28: 4 ug/min via INTRAVENOUS

## 2021-07-28 MED ORDER — DEXAMETHASONE SODIUM PHOSPHATE 10 MG/ML IJ SOLN
INTRAMUSCULAR | Status: DC | PRN
  Administered 2021-07-28: 10 mg via INTRAVENOUS

## 2021-07-28 MED ORDER — SODIUM CHLORIDE 0.9 % IV SOLN: 250.0000 mL | INTRAVENOUS | Status: DC | PRN

## 2021-07-28 MED ORDER — PROTAMINE SULFATE 10 MG/ML IV SOLN
INTRAVENOUS | Status: DC | PRN
  Administered 2021-07-28: 35 mg via INTRAVENOUS

## 2021-07-28 MED ORDER — COLCHICINE 0.6 MG PO TABS: 0.6000 mg | ORAL_TABLET | Freq: Two times a day (BID) | ORAL | 0 refills | Status: DC

## 2021-07-28 MED ORDER — ACETAMINOPHEN 325 MG PO TABS: 650.0000 mg | ORAL_TABLET | ORAL | Status: DC | PRN

## 2021-07-28 MED ORDER — ONDANSETRON HCL 4 MG/2ML IJ SOLN: 4.0000 mg | Freq: Four times a day (QID) | INTRAMUSCULAR | Status: DC | PRN

## 2021-07-28 MED ORDER — HEPARIN (PORCINE) IN NACL 1000-0.9 UT/500ML-% IV SOLN
INTRAVENOUS | Status: AC
  Filled 2021-07-28: qty 2000

## 2021-07-28 MED ORDER — ONDANSETRON HCL 4 MG/2ML IJ SOLN
INTRAMUSCULAR | Status: DC | PRN
  Administered 2021-07-28: 4 mg via INTRAVENOUS

## 2021-07-28 MED ORDER — HEPARIN SODIUM (PORCINE) 1000 UNIT/ML IJ SOLN
INTRAMUSCULAR | Status: DC | PRN
  Administered 2021-07-28: 1000 [IU] via INTRAVENOUS

## 2021-07-28 MED ORDER — ROCURONIUM BROMIDE 10 MG/ML (PF) SYRINGE
PREFILLED_SYRINGE | INTRAVENOUS | Status: DC | PRN
  Administered 2021-07-28: 50 mg via INTRAVENOUS
  Administered 2021-07-28: 10 mg via INTRAVENOUS

## 2021-07-28 MED ORDER — SODIUM CHLORIDE 0.9 % IV SOLN: INTRAVENOUS | Status: DC

## 2021-07-28 MED ORDER — COLCHICINE 0.6 MG PO TABS
0.6000 mg | ORAL_TABLET | Freq: Two times a day (BID) | ORAL | Status: DC
Start: 2021-07-28 — End: 2021-07-29
  Administered 2021-07-28: 0.6 mg via ORAL
  Filled 2021-07-28: qty 1

## 2021-07-28 MED ORDER — PHENYLEPHRINE HCL-NACL 20-0.9 MG/250ML-% IV SOLN
INTRAVENOUS | Status: DC | PRN
  Administered 2021-07-28: 20 ug/min via INTRAVENOUS

## 2021-07-28 MED ORDER — ACETAMINOPHEN 500 MG PO TABS
1000.0000 mg | ORAL_TABLET | Freq: Once | ORAL | Status: AC
  Administered 2021-07-28: 1000 mg via ORAL

## 2021-07-28 MED ORDER — PROPOFOL 10 MG/ML IV BOLUS
INTRAVENOUS | Status: DC | PRN
  Administered 2021-07-28: 100 mg via INTRAVENOUS
  Administered 2021-07-28: 20 mg via INTRAVENOUS

## 2021-07-28 MED ORDER — SUGAMMADEX SODIUM 200 MG/2ML IV SOLN
INTRAVENOUS | Status: DC | PRN
  Administered 2021-07-28: 100 mg via INTRAVENOUS

## 2021-07-28 MED ORDER — SODIUM CHLORIDE 0.9% FLUSH: 3.0000 mL | Freq: Two times a day (BID) | INTRAVENOUS | Status: DC

## 2021-07-28 MED ORDER — PANTOPRAZOLE SODIUM 40 MG PO TBEC: 40.0000 mg | DELAYED_RELEASE_TABLET | Freq: Every day | ORAL | 0 refills | Status: DC

## 2021-07-28 MED ORDER — SODIUM CHLORIDE 0.9% FLUSH: 3.0000 mL | INTRAVENOUS | Status: DC | PRN

## 2021-07-28 MED ORDER — HEPARIN (PORCINE) IN NACL 1000-0.9 UT/500ML-% IV SOLN
INTRAVENOUS | Status: DC | PRN
  Administered 2021-07-28 (×4): 500 mL

## 2021-07-28 MED ORDER — HEPARIN SODIUM (PORCINE) 1000 UNIT/ML IJ SOLN
INTRAMUSCULAR | Status: DC | PRN
  Administered 2021-07-28: 8000 [IU] via INTRAVENOUS
  Administered 2021-07-28: 3000 [IU] via INTRAVENOUS
  Administered 2021-07-28: 2000 [IU] via INTRAVENOUS

## 2021-07-28 MED ORDER — RIVAROXABAN 20 MG PO TABS
20.0000 mg | ORAL_TABLET | Freq: Every day | ORAL | Status: DC
  Administered 2021-07-28: 20 mg via ORAL
  Filled 2021-07-28: qty 1

## 2021-07-28 MED ORDER — PHENYLEPHRINE 80 MCG/ML (10ML) SYRINGE FOR IV PUSH (FOR BLOOD PRESSURE SUPPORT)
PREFILLED_SYRINGE | INTRAVENOUS | Status: DC | PRN
  Administered 2021-07-28 (×3): 80 ug via INTRAVENOUS

## 2021-07-28 MED ORDER — LIDOCAINE 2% (20 MG/ML) 5 ML SYRINGE
INTRAMUSCULAR | Status: DC | PRN
  Administered 2021-07-28: 60 mg via INTRAVENOUS

## 2021-07-28 MED ORDER — ISOPROTERENOL HCL 0.2 MG/ML IJ SOLN
INTRAMUSCULAR | Status: AC
  Filled 2021-07-28: qty 5

## 2021-07-28 SURGICAL SUPPLY — 16 items
CATH OCTARAY 2.0 F 3-3-3-3-3 (CATHETERS) ×1 IMPLANT
CATH SMTCH THERMOCOOL SF DF (CATHETERS) ×1 IMPLANT
CATH SOUNDSTAR ECO 8FR (CATHETERS) ×1 IMPLANT
CATH WEB BI DIR CSDF CRV REPRO (CATHETERS) ×1 IMPLANT
CLOSURE PERCLOSE PROSTYLE (VASCULAR PRODUCTS) ×2 IMPLANT
COVER SWIFTLINK CONNECTOR (BAG) ×3 IMPLANT
PACK EP LATEX FREE (CUSTOM PROCEDURE TRAY) ×2
PACK EP LF (CUSTOM PROCEDURE TRAY) ×2 IMPLANT
PAD DEFIB RADIO PHYSIO CONN (PAD) ×3 IMPLANT
PATCH CARTO3 (PAD) ×1 IMPLANT
SHEATH BAYLIS TRANSSEPTAL 98CM (NEEDLE) ×1 IMPLANT
SHEATH CARTO VIZIGO SM CVD (SHEATH) ×1 IMPLANT
SHEATH PINNACLE 8F 10CM (SHEATH) ×3 IMPLANT
SHEATH PINNACLE 9F 10CM (SHEATH) ×1 IMPLANT
SHEATH PROBE COVER 6X72 (BAG) ×1 IMPLANT
TUBING SMART ABLATE COOLFLOW (TUBING) ×1 IMPLANT

## 2021-07-28 NOTE — Anesthesia Preprocedure Evaluation (Addendum)
Anesthesia Evaluation  Patient identified by MRN, date of birth, ID band Patient awake    Reviewed: Allergy & Precautions, NPO status , Patient's Chart, lab work & pertinent test results, reviewed documented beta blocker date and time   Airway Mallampati: II  TM Distance: >3 FB Neck ROM: Full    Dental  (+) Teeth Intact, Dental Advisory Given   Pulmonary former smoker,    Pulmonary exam normal breath sounds clear to auscultation       Cardiovascular hypertension, Pt. on home beta blockers Normal cardiovascular exam+ dysrhythmias Atrial Fibrillation  Rhythm:Regular Rate:Normal     Neuro/Psych  Headaches,    GI/Hepatic negative GI ROS, Neg liver ROS,   Endo/Other  negative endocrine ROS  Renal/GU negative Renal ROS     Musculoskeletal negative musculoskeletal ROS (+)   Abdominal   Peds  Hematology  (+) Blood dyscrasia (Xarelto), ,   Anesthesia Other Findings Day of surgery medications reviewed with the patient.  Reproductive/Obstetrics                             Anesthesia Physical Anesthesia Plan  ASA: 3  Anesthesia Plan: General   Post-op Pain Management: Tylenol PO (pre-op)*   Induction: Intravenous  PONV Risk Score and Plan: 3 and Dexamethasone, Ondansetron and Treatment may vary due to age or medical condition  Airway Management Planned: Oral ETT  Additional Equipment:   Intra-op Plan:   Post-operative Plan: Extubation in OR  Informed Consent: I have reviewed the patients History and Physical, chart, labs and discussed the procedure including the risks, benefits and alternatives for the proposed anesthesia with the patient or authorized representative who has indicated his/her understanding and acceptance.     Dental advisory given  Plan Discussed with: CRNA  Anesthesia Plan Comments:        Anesthesia Quick Evaluation

## 2021-07-28 NOTE — Anesthesia Procedure Notes (Signed)
Procedure Name: Intubation Date/Time: 07/28/2021 12:43 PM Performed by: Imagene Riches, CRNA Pre-anesthesia Checklist: Patient identified, Emergency Drugs available, Suction available and Patient being monitored Patient Re-evaluated:Patient Re-evaluated prior to induction Oxygen Delivery Method: Circle System Utilized Preoxygenation: Pre-oxygenation with 100% oxygen Induction Type: IV induction Ventilation: Mask ventilation without difficulty Laryngoscope Size: Miller and 2 Grade View: Grade II Tube type: Oral Number of attempts: 1 Airway Equipment and Method: Stylet, Oral airway and Bougie stylet Placement Confirmation: ETT inserted through vocal cords under direct vision, positive ETCO2 and breath sounds checked- equal and bilateral Secured at: 21 cm Tube secured with: Tape Dental Injury: Teeth and Oropharynx as per pre-operative assessment  Difficulty Due To: Difficult Airway- due to anterior larynx

## 2021-07-28 NOTE — H&P (Signed)
Electrophysiology Office Note:     Date:  07/28/2021    ID:  Tammy [name], DOB [birth year], MRN [id number]   PCP:  Eulas Post, MD      [facility] Cardiologist:  Candee Furbish, MD  [facility] Electrophysiologist:  Vickie Epley, MD    Referring MD: Jerline Pain, MD    Chief Complaint: Consult for ablation   History of Present Illness:     Tammy [name] is a 71 y.o. female who presents for an evaluation of Afib ablation at the request of Dr. Marlou Porch. Their medical history includes atrial fibrillation, and hyperlipidemia.   She saw Dr. Marlou Porch 03/19/2021 and reported more frequent episodes of atrial fibrillation, also logged by her Apple watch. The episodes could last for most of a day. A Zio monitor was ordered and she was referred to EP to discuss potential ablative efforts.   Overall, she is feeling well. Lately her arrhythmias have been less frequent.   Typically she knows when she is in atrial fibrillation. When in Afib, she feels "quivering" sensations in her chest. While lying in bed in the middle of the night, this is worse when on her right side. She also is fatigued, but she denies associated SOB.    She denies any chest pain, shortness of breath, or peripheral edema. No lightheadedness, headaches, syncope, orthopnea, or PND.   Of note, she endorses an allergy to contrast dye. She previously developed pruritis.       Objective        Past Medical History:  Diagnosis Date   Hyperlipidemia             Past Surgical History:  Procedure Laterality Date   DILATION AND CURETTAGE OF UTERUS N/A        Current Medications: Active Medications      Current Meds  Medication Sig   BORON PO Take 1.5 mg by mouth in the morning and at bedtime.   Calcium Carbonate-Vitamin D (CALCIUM-D PO)     Emollient (AMBI EVEN & CLEAR SPF 30 EX) apply   MAGNESIUM PO Take 175 mg by mouth in the morning and at bedtime.   metoprolol succinate (TOPROL-XL) 25 MG 24  hr tablet Take 0.5 tablets (12.5 mg total) by mouth 2 (two) times daily.   metoprolol tartrate (LOPRESSOR) 25 MG tablet Take 1 tablet (25 mg total) by mouth as needed.   Multiple Vitamin (MULTIVITAMIN) tablet Algaecal- Taking 2 tablets by mouth in the am and 2 tablets at dinner   PREVIDENT 5000 BOOSTER PLUS 1.1 % PSTE Place onto teeth.   rivaroxaban (XARELTO) 20 MG TABS tablet TAKE 1 TABLET(20 MG) BY MOUTH DAILY WITH SUPPER   Strontium Chloride CRYS Taking 2 tablets by mouth at bedtime        Allergies:   Penicillins, Codeine, and Contrast media [iodinated contrast media]    Social History         Socioeconomic History   Marital status: Married      Spouse name: Not on file   Number of children: Not on file   Years of education: Not on file   Highest education level: Not on file  Occupational History   Not on file  Tobacco Use   Smoking status: Former      Packs/day: 1.00      Years: 5.00      Pack years: 5.00      Types: Cigarettes      Quit date: 05/26/1973  Years since quitting: 47.9   Smokeless tobacco: Never   Tobacco comments:      Former smoker 11/17/2020  Vaping Use   Vaping Use: Never used  Substance and Sexual Activity   Alcohol use: Yes      Alcohol/week: 1.0 standard drink      Types: 1 Glasses of wine per week      Comment: 4 x's a wk a glass of wine   Drug use: Never   Sexual activity: Not on file  Other Topics Concern   Not on file  Social History Narrative   Not on file    Social Determinants of Health       Financial Resource Strain: Low Risk    Difficulty of Paying Living Expenses: Not hard at all  Food Insecurity: No Food Insecurity   Worried About Charity fundraiser in the Last Year: Never true   Ran Out of Food in the Last Year: Never true  Transportation Needs: No Transportation Needs   Lack of Transportation (Medical): No   Lack of Transportation (Non-Medical): No  Physical Activity: Sufficiently Active   Days of Exercise per Week:  4 days   Minutes of Exercise per Session: 60 min  Stress: No Stress Concern Present   Feeling of Stress : Not at all  Social Connections: Moderately Integrated   Frequency of Communication with Friends and Family: More than three times a week   Frequency of Social Gatherings with Friends and Family: More than three times a week   Attends Religious Services: Never   Marine scientist or Organizations: Yes   Attends Music therapist: More than 4 times per year   Marital Status: Married      Family History: The patient's family history includes Cancer in her father, mother, and sister; Heart disease in her father; Heart disease (age of onset: 52) in her mother; Stroke in her father and sister.   ROS:   Please see the history of present illness.    (+) Palpitations All other systems reviewed and are negative.   EKGs/Labs/Other Studies Reviewed:     The following studies were reviewed today:   Monitor 04/2021: Atrial fibrillation 31% of recording with avg. HR of 88 BPM Occasional PAC's, rare PVCs No pauses Continue with Xarelto   Patch Wear Time:  13 days and 6 hours (2023-01-30T23:27:58-0500 to 2023-02-13T06:21:37-0500)   Patient had a min HR of 45 bpm, max HR of 165 bpm, and avg HR of 68 bpm. Predominant underlying rhythm was Sinus Rhythm. Atrial Fibrillation/Flutter occurred (31% burden), ranging from 53-165 bpm (avg of 88 bpm), the longest lasting 40 mins 57 secs with  an avg rate of 71 bpm. Atrial Flutter may be possible Atrial Tachycardia with variable block. Atrial Fibrillation/Flutter was detected within +/- 45 seconds of symptomatic patient event(s). Isolated SVEs were occasional (2.1%, [id number]), SVE Couplets were  occasional (1.5%, 7521), and SVE Triplets were occasional (1.4%, 4655). Isolated VEs were rare (<1.0%), VE Couplets were rare (<1.0%), and no VE Triplets were present.   EKG:   EKG is personally reviewed.  05/05/2021: EKG was not ordered.      Recent Labs: 03/09/2021: ALT 25; BUN 15; Creatinine, Ser 0.79; Hemoglobin 14.7; Platelets 256.0; Potassium 4.4; Sodium 138; TSH 1.83    Recent Lipid Panel Labs (Brief)          Component Value Date/Time    CHOL 207 (H) 03/09/2021 0959    CHOL 189 06/27/2019 0844  TRIG 86.0 03/09/2021 0959    HDL 55.40 03/09/2021 0959    HDL 63 06/27/2019 0844    CHOLHDL 4 03/09/2021 0959    VLDL 17.2 03/09/2021 0959    LDLCALC 135 (H) 03/09/2021 0959    LDLCALC 113 (H) 06/27/2019 0844    LDLDIRECT 142.6 03/04/2010 1001        Physical Exam:     VS:  BP 142/63   Pulse 70   Ht '5\' 5"'$  (1.651 m)   Wt 117 lb 3.2 oz (53.2 kg)   LMP  (LMP Unknown)   SpO2 98%   BMI 19.50 kg/m         Wt Readings from Last 3 Encounters:  05/05/21 117 lb 3.2 oz (53.2 kg)  03/19/21 114 lb (51.7 kg)  03/09/21 114 lb 3.2 oz (51.8 kg)      GEN: Well nourished, well developed in no acute distress HEENT: Normal NECK: No JVD; No carotid bruits LYMPHATICS: No lymphadenopathy CARDIAC: RRR, no murmurs, rubs, gallops RESPIRATORY:  Clear to auscultation without rales, wheezing or rhonchi  ABDOMEN: Soft, non-tender, non-distended MUSCULOSKELETAL:  No edema; No deformity  SKIN: Warm and dry NEUROLOGIC:  Alert and oriented x 3 PSYCHIATRIC:  Normal affect          Assessment     ASSESSMENT:     1. Paroxysmal atrial fibrillation (HCC)   2. Atherosclerosis of both carotid arteries     PLAN:     In order of problems listed above:   #Paroxysmal atrial fibrillation Symptomatic.  On Xarelto for stroke prophylaxis.  I discussed the treatment options available for her atrial fibrillation during today's appointment including a continued watchful waiting approach, antiarrhythmic drug therapy and catheter ablation.  I do think she is a good candidate for catheter ablation.  I discussed the catheter ablation procedure in detail with the patient including the risks, recovery and likelihood of success.  I discussed the  potential need for repeat ablations and antiarrhythmic drug therapy after an initial ablation attempt.  The patient would like to think about her options with her family which is very reasonable and she will let us know how she would like to proceed.   Risk, benefits, and alternatives to EP study and radiofrequency ablation for afib were also discussed in detail today. These risks include but are not limited to stroke, bleeding, vascular damage, tamponade, perforation, damage to the esophagus, lungs, and other structures, pulmonary vein stenosis, worsening renal function, and death. Carto, ICE, anesthesia are requested for the procedure.  Will also obtain CT PV protocol prior to the procedure to exclude LAA thrombus and further evaluate atrial anatomy.  She would also need an echocardiogram prior to the procedure.   Presents today for PVI.          Signed, Hilton Cork. Quentin Ore, MD, [facility], [facility] 07/28/2021 Electrophysiology [facility]

## 2021-07-28 NOTE — Progress Notes (Signed)
Purewick placed and education provided.

## 2021-07-28 NOTE — Transfer of Care (Signed)
Immediate Anesthesia Transfer of Care Note  Patient: Tammy [name]  Procedure(s) Performed: ATRIAL FIBRILLATION ABLATION  Patient Location: Cath Lab  Anesthesia Type:General  Level of Consciousness: awake, alert  and oriented  Airway & Oxygen Therapy: Patient Spontanous Breathing and Patient connected to nasal cannula oxygen  Post-op Assessment: Report given to RN and Post -op Vital signs reviewed and stable  Post vital signs: Reviewed and stable  Last Vitals:  Vitals Value Taken Time  BP 131/65 07/28/21 1438  Temp    Pulse 79 07/28/21 1440  Resp 13 07/28/21 1440  SpO2 99 % 07/28/21 1440  Vitals shown include unvalidated device data.  Last Pain:  Vitals:   07/28/21 1439  TempSrc:   PainSc: 0-No pain         Complications: No notable events documented.

## 2021-07-28 NOTE — Discharge Instructions (Signed)
Post procedure care instructions No driving for 4 days. No lifting over 5 lbs for 1 week. No vigorous or sexual activity for 1 week. You may return to work/your usual activities on 08/04/21. Keep procedure site clean & dry. If you notice increased pain, swelling, bleeding or pus, call/return!  You may shower after 24 hours, but no soaking in baths/hot tubs/pools for 1 week.     You have an appointment set up with the [facility].  Multiple studies have shown that being followed by a dedicated atrial fibrillation clinic in addition to the standard care you receive from your other physicians improves health. We believe that enrollment in the atrial fibrillation clinic will allow [place] to better care for you.   The phone number to the [facility] is [phone]. The clinic is staffed Monday through Friday from 8:30am to 5pm.  Parking Directions: The clinic is located in the Heart and Vascular Building connected to [facility]. 1)From [address] turn on to [address] and go to the 3rd entrance  (Heart and Vascular entrance) on the right. 2)Look to the right for Heart &Vascular Parking Garage. 3)A code for the entrance is required, for July is 1404.   4)Take the elevators to the 1st floor. Registration is in the room with the glass walls at the end of the hallway.  If you have any trouble parking or locating the clinic, please don't hesitate to call [phone].

## 2021-07-28 NOTE — Anesthesia Postprocedure Evaluation (Signed)
Anesthesia Post Note  Patient: Tammy [name]  Procedure(s) Performed: ATRIAL FIBRILLATION ABLATION     Patient location during evaluation: PACU Anesthesia Type: General Level of consciousness: awake and alert Pain management: pain level controlled Vital Signs Assessment: post-procedure vital signs reviewed and stable Respiratory status: spontaneous breathing, nonlabored ventilation and respiratory function stable Cardiovascular status: blood pressure returned to baseline and stable Postop Assessment: no apparent nausea or vomiting Anesthetic complications: no   No notable events documented.  Last Vitals:  Vitals:   07/28/21 1815 07/28/21 1830  BP: 127/64 131/67  Pulse: 74 76  Resp: 13 (!) 9  Temp:    SpO2: 97% 97%    Last Pain:  Vitals:   07/28/21 1515  TempSrc:   PainSc: 0-No pain                 [name]

## 2021-08-06 ENCOUNTER — Other Ambulatory Visit: Payer: Self-pay | Admitting: Cardiology

## 2021-08-13 ENCOUNTER — Encounter: Payer: Self-pay | Admitting: Cardiology

## 2021-08-26 ENCOUNTER — Ambulatory Visit (HOSPITAL_COMMUNITY): Payer: PPO | Admitting: Physician Assistant

## 2021-09-01 ENCOUNTER — Ambulatory Visit (HOSPITAL_COMMUNITY)
Admission: RE | Admit: 2021-09-01 | Discharge: 2021-09-01 | Disposition: A | Discharge status: Home / Self Care | Payer: PPO | Source: Ambulatory Visit | Attending: Physician Assistant | Admitting: Physician Assistant

## 2021-09-01 ENCOUNTER — Encounter (HOSPITAL_COMMUNITY): Payer: Self-pay | Admitting: Physician Assistant

## 2021-09-01 VITALS — BP 112/80 | HR 71 | Ht 65.0 in | Wt 119.0 lb

## 2021-09-01 DIAGNOSIS — I471 Supraventricular tachycardia: Secondary | ICD-10-CM | POA: Diagnosis not present

## 2021-09-01 DIAGNOSIS — D6869 Other thrombophilia: Secondary | ICD-10-CM | POA: Diagnosis not present

## 2021-09-01 DIAGNOSIS — E785 Hyperlipidemia, unspecified: Secondary | ICD-10-CM | POA: Insufficient documentation

## 2021-09-01 DIAGNOSIS — I6529 Occlusion and stenosis of unspecified carotid artery: Secondary | ICD-10-CM | POA: Insufficient documentation

## 2021-09-01 DIAGNOSIS — Z7901 Long term (current) use of anticoagulants: Secondary | ICD-10-CM | POA: Diagnosis not present

## 2021-09-01 DIAGNOSIS — Z79899 Other long term (current) drug therapy: Secondary | ICD-10-CM | POA: Diagnosis not present

## 2021-09-01 DIAGNOSIS — I480 Paroxysmal atrial fibrillation: Secondary | ICD-10-CM

## 2021-09-01 NOTE — Progress Notes (Signed)
Primary Care Physician: Eulas Post, MD Primary Cardiologist: Dr Marlou Porch Primary Electrophysiologist: Dr Quentin Ore Referring Physician: Dr Denny Levy Tammy [name] is a 71 y.o. female with a history of HLD, carotid artery stenosis, atrial fibrillation, atrial tachycardia who presents for follow up in the [facility]. Patient is on Xarelto for a CHADS2VASC score of 3. She called [facility] 07/10/20 with symptoms of tachypalpitations. She took PRN BB and converted to SR after ~20 hours. She then started daily metoprolol to help maintain SR.   On follow up today, patient is s/p afib ablation with Dr Quentin Ore on 07/28/21. Patient reports that she has not had any afib since her ablation. She denies any CP, swallowing pain, or groin issues.   Today, she denies symptoms of palpitations, chest pain, shortness of breath, orthopnea, PND, lower extremity edema, dizziness, presyncope, syncope, snoring, daytime somnolence, bleeding, or neurologic sequela. The patient is tolerating medications without difficulties and is otherwise without complaint today.    Atrial Fibrillation Risk Factors:  she does not have symptoms or diagnosis of sleep apnea. she does not have a history of rheumatic fever.   she has a BMI of Body mass index is 19.8 kg/m.Marland Kitchen Filed Weights   09/01/21 1403  Weight: 54 kg     Family History  Problem Relation Age of Onset   Heart disease Mother 33       CABG   Cancer Mother        bladder cancer   Heart disease Father        MVP, atrial fibrilation   Cancer Father        colon cancer   Stroke Father    Cancer Sister        colon cancer   Stroke Sister      Atrial Fibrillation Management history:  Previous antiarrhythmic drugs: none Previous cardioversions: none Previous ablations: 07/28/21 CHADS2VASC score: 3 Anticoagulation history: Xarelto   Past Medical History:  Diagnosis Date   Hyperlipidemia    Past Surgical History:   Procedure Laterality Date   ATRIAL FIBRILLATION ABLATION N/A 07/28/2021   Procedure: ATRIAL FIBRILLATION ABLATION;  Surgeon: Vickie Epley, MD;  Location: [facility];  Service: Cardiovascular;  Laterality: N/A;   DILATION AND CURETTAGE OF UTERUS N/A     Current Outpatient Medications  Medication Sig Dispense Refill   metoprolol succinate (TOPROL-XL) 25 MG 24 hr tablet TAKE 1 TABLET(25 MG) BY MOUTH DAILY 90 tablet 2   metoprolol tartrate (LOPRESSOR) 25 MG tablet Take 1 tablet (25 mg total) by mouth as needed. 30 tablet 11   OVER THE COUNTER MEDICATION Take 2 capsules by mouth 2 (two) times daily. Algae cal +     OVER THE COUNTER MEDICATION Take 2 capsules by mouth at bedtime. Stronsium Boost     pantoprazole (PROTONIX) 40 MG tablet Take 1 tablet (40 mg total) by mouth daily. 45 tablet 0   PREVIDENT 5000 BOOSTER PLUS 1.1 % PSTE Place 1 application. onto teeth See admin instructions. 1 daily full mouth and after that brush one tooth daily     rivaroxaban (XARELTO) 20 MG TABS tablet TAKE 1 TABLET(20 MG) BY MOUTH DAILY WITH SUPPER 90 tablet 1   sertraline (ZOLOFT) 25 MG tablet Take 1 tablet (25 mg total) by mouth daily. 30 tablet 3   No current facility-administered medications for this encounter.    Allergies  Allergen Reactions   Novocain [Procaine] Palpitations   Penicillins Rash  Codeine Other (See Comments)    Pt. States "crazy dreams"   Contrast Media [Iodinated Contrast Media] Itching    Eye Swelling    Social History   Socioeconomic History   Marital status: Married    Spouse name: Not on file   Number of children: Not on file   Years of education: Not on file   Highest education level: Some college, no degree  Occupational History   Not on file  Tobacco Use   Smoking status: Former    Packs/day: 1.00    Years: 5.00    Total pack years: 5.00    Types: Cigarettes    Quit date: 05/26/1973    Years since quitting: 48.3   Smokeless tobacco: Never   Tobacco  comments:    Former smoker 11/17/2020  Vaping Use   Vaping Use: Never used  Substance and Sexual Activity   Alcohol use: Not Currently    Alcohol/week: 1.0 standard drink of alcohol    Types: 1 Glasses of wine per week    Comment: stopped drinking 06/16/21   Drug use: Never   Sexual activity: Not on file  Other Topics Concern   Not on file  Social History Narrative   Not on file   Social Determinants of Health   Financial Resource Strain: Low Risk  (06/17/2021)   Overall Financial Resource Strain (CARDIA)    Difficulty of Paying Living Expenses: Not hard at all  Food Insecurity: No Food Insecurity (06/17/2021)   Hunger Vital Sign    Worried About Running Out of Food in the Last Year: Never true    [place] in the Last Year: Never true  Transportation Needs: No Transportation Needs (06/17/2021)   PRAPARE - Hydrologist (Medical): No    Lack of Transportation (Non-Medical): No  Physical Activity: Unknown (06/17/2021)   Exercise Vital Sign    Days of Exercise per Week: Patient refused    Minutes of Exercise per Session: 70 min  Stress: Stress Concern Present (06/17/2021)   [place]    Feeling of Stress : Rather much  Social Connections: Socially Integrated (06/17/2021)   Social Connection and Isolation Panel [NHANES]    Frequency of Communication with Friends and Family: Once a week    Frequency of Social Gatherings with Friends and Family: Twice a week    Attends Religious Services: More than 4 times per year    Active Member of [employer] or Organizations: Yes    Attends Music therapist: More than 4 times per year    Marital Status: Married  Human resources officer Violence: Not At Risk (05/25/2021)   Humiliation, Afraid, Rape, and Kick questionnaire    Fear of Current or Ex-Partner: No    Emotionally Abused: No    Physically Abused: No    Sexually Abused: No     ROS-  All systems are reviewed and negative except as per the HPI above.  Physical Exam: Vitals:   09/01/21 1403  BP: 112/80  Pulse: 71  Weight: 54 kg  Height: '5\' 5"'$  (1.651 m)     GEN- The patient is a well appearing female, alert and oriented x 3 today.   HEENT-head normocephalic, atraumatic, sclera clear, conjunctiva pink, hearing intact, trachea midline. Lungs- Clear to ausculation bilaterally, normal work of breathing Heart- Regular rate and rhythm, no murmurs, rubs or gallops  GI- soft, NT, ND, + BS Extremities- no clubbing,  cyanosis, or edema MS- no significant deformity or atrophy Skin- no rash or lesion Psych- euthymic mood, full affect Neuro- strength and sensation are intact   Wt Readings from Last 3 Encounters:  09/01/21 54 kg  07/28/21 53.1 kg  06/17/21 53.1 kg    EKG today demonstrates  SR Vent. rate 71 BPM PR interval 132 ms QRS duration 80 ms QT/QTcB 400/434 ms  Echo 07/15/21 demonstrated   1. Left ventricular ejection fraction, by estimation, is 60 to 65%. The  left ventricle has normal function. The left ventricle has no regional  wall motion abnormalities. Left ventricular diastolic parameters were  normal.   2. Right ventricular systolic function is normal. The right ventricular  size is normal. There is normal pulmonary artery systolic pressure. The  estimated right ventricular systolic pressure is 29.5 mmHg.   3. The mitral valve is myxomatous. Mild mitral valve regurgitation. No  evidence of mitral stenosis. There is mild late systolic prolapse of both  leaflets of the mitral valve.   4. The aortic valve is tricuspid. Aortic valve regurgitation is not  visualized. No aortic stenosis is present.   5. The inferior vena cava is normal in size with greater than 50%  respiratory variability, suggesting right atrial pressure of 3 mmHg.   Comparison(s): No significant change from prior study. Prior images  reviewed side by side.   Epic records are  reviewed at length today  CHA2DS2-VASc Score = 3  The patient's score is based upon: CHF History: 0 HTN History: 0 Diabetes History: 0 Stroke History: 0 Vascular Disease History: 1 (carotid artery disease) Age Score: 1 Gender Score: 1       ASSESSMENT AND PLAN: 1. Paroxysmal Atrial Fibrillation/atrial tachycardia  The patient's CHA2DS2-VASc score is 3, indicating a 3.2% annual risk of stroke.   S/p afib ablation 07/28/21 Patient appears to be maintaining SR. Continue Toprol 25 mg daily with Lopressor 25 mg q 6 hours for heart racing. Continue Xarelto 20 mg daily with no missed doses for 3 months post ablation.  2. Secondary Hypercoagulable State (ICD10:  D68.69) The patient is at significant risk for stroke/thromboembolism based upon her CHA2DS2-VASc Score of 3.  Continue Rivaroxaban (Xarelto).    Follow up with Dr Quentin Ore as scheduled.    [facility] [address] [place], [place] [address] [phone] 09/01/2021 2:09 PM

## 2021-09-21 ENCOUNTER — Other Ambulatory Visit: Payer: Self-pay | Admitting: Cardiology

## 2021-09-21 DIAGNOSIS — I480 Paroxysmal atrial fibrillation: Secondary | ICD-10-CM

## 2021-09-21 NOTE — Telephone Encounter (Signed)
Prescription refill request for Xarelto received.  Indication: PAF Last office visit: 09/01/21  C Fenton PA Weight: 54kg Age: 71 Scr: 0.91 CrCl: 48.34    Based on above findings Xarelto '20mg'$  daily should be reduced to '15mg'$  daily for CrCl of 48.34.  Message sent to C Fenton PA to advise on dose change before refilling medicine.

## 2021-10-01 ENCOUNTER — Ambulatory Visit (INDEPENDENT_AMBULATORY_CARE_PROVIDER_SITE_OTHER): Payer: PPO | Admitting: Family

## 2021-10-01 ENCOUNTER — Encounter: Payer: Self-pay | Admitting: Family

## 2021-10-01 VITALS — BP 124/72 | HR 74 | Temp 98.6°F | Ht 65.0 in | Wt 118.4 lb

## 2021-10-01 DIAGNOSIS — R053 Chronic cough: Secondary | ICD-10-CM

## 2021-10-01 MED ORDER — AZITHROMYCIN 250 MG PO TABS: ORAL_TABLET | ORAL | 0 refills | Status: AC

## 2021-10-01 NOTE — Progress Notes (Signed)
Patient ID: Tammy [name], female    DOB: 1951-01-09, 71 y.o.   MRN: [id number]  Chief Complaint  Patient presents with   Cough    Pt c/o coughing and nasal/throat congestion for the past 2 weeks. Has not tried tylenol which. Drainage is dark yellow/ green color.     HPI: Persistent cough:  pt reports starting with sinus sx 2 weeks ago and then quickly went down in her chest and she has been coughing for about last 10days, productive with yellow/green mucus. Denies sore throat, clearing throat frequently, fever, or SOB. Pt did not test for Covid, she had mild sx back in March when she was positive at that time.   Assessment & Plan:  1. Persistent cough sending Zpack - advised on use & SE. Continue to drink 2 L of water qd, take tylenol prn for any pain or headache.  - azithromycin (ZITHROMAX) 250 MG tablet; Take 2 tablets on day 1, then 1 tablet daily on days 2 through 5  Dispense: 6 tablet; Refill: 0   Subjective:    Outpatient Medications Prior to Visit  Medication Sig Dispense Refill   metoprolol succinate (TOPROL-XL) 25 MG 24 hr tablet TAKE 1 TABLET(25 MG) BY MOUTH DAILY 90 tablet 2   metoprolol tartrate (LOPRESSOR) 25 MG tablet Take 1 tablet (25 mg total) by mouth as needed. 30 tablet 11   OVER THE COUNTER MEDICATION Take 2 capsules by mouth 2 (two) times daily. Algae cal +     OVER THE COUNTER MEDICATION Take 2 capsules by mouth at bedtime. Stronsium Boost     PREVIDENT 5000 BOOSTER PLUS 1.1 % PSTE Place 1 application. onto teeth See admin instructions. 1 daily full mouth and after that brush one tooth daily     sertraline (ZOLOFT) 25 MG tablet Take 1 tablet (25 mg total) by mouth daily. 30 tablet 3   XARELTO 20 MG TABS tablet TAKE 1 TABLET(20 MG) BY MOUTH DAILY WITH SUPPER 90 tablet 1   pantoprazole (PROTONIX) 40 MG tablet Take 1 tablet (40 mg total) by mouth daily. 45 tablet 0   No facility-administered medications prior to visit.   Past Medical History:  Diagnosis  Date   Hyperlipidemia    Past Surgical History:  Procedure Laterality Date   ATRIAL FIBRILLATION ABLATION N/A 07/28/2021   Procedure: ATRIAL FIBRILLATION ABLATION;  Surgeon: Vickie Epley, MD;  Location: [facility];  Service: Cardiovascular;  Laterality: N/A;   DILATION AND CURETTAGE OF UTERUS N/A    Allergies  Allergen Reactions   Novocain [Procaine] Palpitations   Penicillins Rash   Codeine Other (See Comments)    Pt. States "crazy dreams"   Contrast Media [Iodinated Contrast Media] Itching    Eye Swelling      Objective:    Physical Exam Vitals and nursing note reviewed.  Constitutional:      Appearance: Normal appearance.  HENT:     Right Ear: Tympanic membrane and ear canal normal.     Left Ear: Tympanic membrane and ear canal normal.     Nose:     Right Sinus: No frontal sinus tenderness.     Left Sinus: No frontal sinus tenderness.     Mouth/Throat:     Mouth: Mucous membranes are moist.     Pharynx: Posterior oropharyngeal erythema (mild) present. No pharyngeal swelling, oropharyngeal exudate or uvula swelling.  Cardiovascular:     Rate and Rhythm: Normal rate and regular rhythm.  Pulmonary:  Effort: Pulmonary effort is normal.     Breath sounds: Normal breath sounds.  Musculoskeletal:        General: Normal range of motion.  Lymphadenopathy:     Cervical: No cervical adenopathy.     Upper Body:     Right upper body: No supraclavicular adenopathy.     Left upper body: No supraclavicular adenopathy.  Skin:    General: Skin is warm and dry.  Neurological:     Mental Status: She is alert.  Psychiatric:        Mood and Affect: Mood normal.        Behavior: Behavior normal.   BP 124/72 (BP Location: Left Arm, Patient Position: Sitting, Cuff Size: Large)   Pulse 74   Temp 98.6 F (37 C) (Temporal)   Ht '5\' 5"'$  (1.651 m)   Wt 118 lb 6 oz (53.7 kg)   LMP  (LMP Unknown)   SpO2 96%   BMI 19.70 kg/m  Wt Readings from Last 3 Encounters:   10/01/21 118 lb 6 oz (53.7 kg)  09/01/21 119 lb (54 kg)  07/28/21 117 lb (53.1 kg)       Jeanie Sewer, NP

## 2021-10-07 ENCOUNTER — Encounter: Payer: Self-pay | Admitting: Family Medicine

## 2021-10-08 MED ORDER — SERTRALINE HCL 25 MG PO TABS: 25.0000 mg | ORAL_TABLET | Freq: Every day | ORAL | 3 refills | Status: DC

## 2021-10-08 NOTE — Telephone Encounter (Signed)
Great to hear.   Thanks for the feedback!    I have refilled the Zoloft.

## 2021-10-22 DIAGNOSIS — M810 Age-related osteoporosis without current pathological fracture: Secondary | ICD-10-CM | POA: Diagnosis not present

## 2021-10-22 DIAGNOSIS — Z681 Body mass index (BMI) 19 or less, adult: Secondary | ICD-10-CM | POA: Diagnosis not present

## 2021-10-22 DIAGNOSIS — Z124 Encounter for screening for malignant neoplasm of cervix: Secondary | ICD-10-CM | POA: Diagnosis not present

## 2021-10-22 DIAGNOSIS — Z8262 Family history of osteoporosis: Secondary | ICD-10-CM | POA: Diagnosis not present

## 2021-10-22 DIAGNOSIS — N319 Neuromuscular dysfunction of bladder, unspecified: Secondary | ICD-10-CM | POA: Diagnosis not present

## 2021-10-22 DIAGNOSIS — R636 Underweight: Secondary | ICD-10-CM | POA: Diagnosis not present

## 2021-10-22 DIAGNOSIS — N958 Other specified menopausal and perimenopausal disorders: Secondary | ICD-10-CM | POA: Diagnosis not present

## 2021-11-02 ENCOUNTER — Ambulatory Visit: Payer: PPO | Attending: Cardiology | Admitting: Cardiology

## 2021-11-02 ENCOUNTER — Encounter: Payer: Self-pay | Admitting: Cardiology

## 2021-11-02 VITALS — BP 120/74 | HR 70 | Ht 65.0 in | Wt 119.4 lb

## 2021-11-02 DIAGNOSIS — I491 Atrial premature depolarization: Secondary | ICD-10-CM | POA: Diagnosis not present

## 2021-11-02 DIAGNOSIS — I480 Paroxysmal atrial fibrillation: Secondary | ICD-10-CM | POA: Diagnosis not present

## 2021-11-02 NOTE — Progress Notes (Signed)
Electrophysiology Office Follow up Visit Note:    Date:  11/02/2021   ID:  Tammy [name], DOB [birth year], MRN [id number]  PCP:  Eulas Post, MD  [facility] Cardiologist:  Candee Furbish, MD  [facility] Electrophysiologist:  Vickie Epley, MD    Interval History:    Tammy [name] is a 71 y.o. female who presents for a follow up visit. She had an A-fib ablation on July 28, 2021.  During that procedure the veins were isolated.  She is all Ricky in the A-fib clinic for follow-up September 01, 2021.  At that appointment she reported no recurrent A-fib following her ablation.  She uses Xarelto for stroke prophylaxis.  Today, she reports 2 brief episodes of atrial fibrillation over 1 month ago. She continues to have occasional PAC's.  At home her blood pressure is typically lower than in clinic (120/74 today).  She remains compliant with Xarelto and metoprolol. Currently she is taking 1/2 tablet of metoprolol at breakfast, and 1/2 tablet at dinner. She did this to help lessen the fatigue side effects.  For exercise she walks regularly.  She denies any chest pain, shortness of breath, or peripheral edema. No lightheadedness, headaches, syncope, orthopnea, or PND.      Past Medical History:  Diagnosis Date   Hyperlipidemia     Past Surgical History:  Procedure Laterality Date   ATRIAL FIBRILLATION ABLATION N/A 07/28/2021   Procedure: ATRIAL FIBRILLATION ABLATION;  Surgeon: Vickie Epley, MD;  Location: [facility];  Service: Cardiovascular;  Laterality: N/A;   DILATION AND CURETTAGE OF UTERUS N/A     Current Medications: Current Meds  Medication Sig   metoprolol succinate (TOPROL-XL) 25 MG 24 hr tablet TAKE 1 TABLET(25 MG) BY MOUTH DAILY   metoprolol tartrate (LOPRESSOR) 25 MG tablet Take 1 tablet (25 mg total) by mouth as needed.   OVER THE COUNTER MEDICATION Take 2 capsules by mouth 2 (two) times daily. Algae cal +   OVER THE COUNTER MEDICATION  Take 2 capsules by mouth at bedtime. Stronsium Boost   PREVIDENT 5000 BOOSTER PLUS 1.1 % PSTE Place 1 application. onto teeth See admin instructions. 1 daily full mouth and after that brush one tooth daily   sertraline (ZOLOFT) 25 MG tablet Take 1 tablet (25 mg total) by mouth daily.   XARELTO 20 MG TABS tablet TAKE 1 TABLET(20 MG) BY MOUTH DAILY WITH SUPPER     Allergies:   Novocain [procaine], Penicillins, Codeine, and Contrast media [iodinated contrast media]   Social History   Socioeconomic History   Marital status: Married    Spouse name: Not on file   Number of children: Not on file   Years of education: Not on file   Highest education level: Some college, no degree  Occupational History   Not on file  Tobacco Use   Smoking status: Former    Packs/day: 1.00    Years: 5.00    Total pack years: 5.00    Types: Cigarettes    Quit date: 05/26/1973    Years since quitting: 48.4   Smokeless tobacco: Never   Tobacco comments:    Former smoker 11/17/2020  Vaping Use   Vaping Use: Never used  Substance and Sexual Activity   Alcohol use: Not Currently    Alcohol/week: 1.0 standard drink of alcohol    Types: 1 Glasses of wine per week    Comment: stopped drinking 06/16/21   Drug use: Never   Sexual activity:  Not on file  Other Topics Concern   Not on file  Social History Narrative   Not on file   Social Determinants of Health   Financial Resource Strain: Low Risk  (06/17/2021)   Overall Financial Resource Strain (CARDIA)    Difficulty of Paying Living Expenses: Not hard at all  Food Insecurity: No Food Insecurity (06/17/2021)   Hunger Vital Sign    Worried About Running Out of Food in the Last Year: Never true    Ran Out of Food in the Last Year: Never true  Transportation Needs: No Transportation Needs (06/17/2021)   PRAPARE - Hydrologist (Medical): No    Lack of Transportation (Non-Medical): No  Physical Activity: Unknown (06/17/2021)    Exercise Vital Sign    Days of Exercise per Week: Patient refused    Minutes of Exercise per Session: 70 min  Stress: Stress Concern Present (06/17/2021)   [place]    Feeling of Stress : Rather much  Social Connections: Socially Integrated (06/17/2021)   Social Connection and Isolation Panel [NHANES]    Frequency of Communication with Friends and Family: Once a week    Frequency of Social Gatherings with Friends and Family: Twice a week    Attends Religious Services: More than 4 times per year    Active Member of [employer] or Organizations: Yes    Attends Music therapist: More than 4 times per year    Marital Status: Married     Family History: The patient's family history includes Cancer in her father, mother, and sister; Heart disease in her father; Heart disease (age of onset: 37) in her mother; Stroke in her father and sister.  ROS:   Please see the history of present illness.    (+) Palpitations (+) Fatigue All other systems reviewed and are negative.  EKGs/Labs/Other Studies Reviewed:    The following studies were reviewed today:  Echo  07/15/2021:  1. Left ventricular ejection fraction, by estimation, is 60 to 65%. The  left ventricle has normal function. The left ventricle has no regional  wall motion abnormalities. Left ventricular diastolic parameters were  normal.   2. Right ventricular systolic function is normal. The right ventricular  size is normal. There is normal pulmonary artery systolic pressure. The  estimated right ventricular systolic pressure is 41.9 mmHg.   3. The mitral valve is myxomatous. Mild mitral valve regurgitation. No  evidence of mitral stenosis. There is mild late systolic prolapse of both  leaflets of the mitral valve.   4. The aortic valve is tricuspid. Aortic valve regurgitation is not  visualized. No aortic stenosis is present.   5. The inferior vena cava is  normal in size with greater than 50%  respiratory variability, suggesting right atrial pressure of 3 mmHg.   Comparison(s): No significant change from prior study. Prior images  reviewed side by side.   EKG:  EKG is personally reviewed.  11/02/2021: Sinus rhythm.  PACs.  Recent Labs: 03/09/2021: ALT 25; TSH 1.83 07/06/2021: BUN 18; Creatinine, Ser 0.91; Hemoglobin 14.4; Platelets 259; Potassium 4.2; Sodium 137   Recent Lipid Panel    Component Value Date/Time   CHOL 207 (H) 03/09/2021 0959   CHOL 189 06/27/2019 0844   TRIG 86.0 03/09/2021 0959   HDL 55.40 03/09/2021 0959   HDL 63 06/27/2019 0844   CHOLHDL 4 03/09/2021 0959   VLDL 17.2 03/09/2021 0959   LDLCALC 135 (  H) 03/09/2021 0959   LDLCALC 113 (H) 06/27/2019 0844   LDLDIRECT 142.6 03/04/2010 1001    Physical Exam:    VS:  BP 120/74   Pulse 70   Ht '5\' 5"'$  (1.651 m)   Wt 119 lb 6.4 oz (54.2 kg)   LMP  (LMP Unknown)   SpO2 98%   BMI 19.87 kg/m     Wt Readings from Last 3 Encounters:  11/02/21 119 lb 6.4 oz (54.2 kg)  10/01/21 118 lb 6 oz (53.7 kg)  09/01/21 119 lb (54 kg)     GEN: Well nourished, well developed in no acute distress HEENT: Normal NECK: No JVD; No carotid bruits LYMPHATICS: No lymphadenopathy CARDIAC: RRR, no murmurs, rubs, gallops RESPIRATORY:  Clear to auscultation without rales, wheezing or rhonchi  ABDOMEN: Soft, non-tender, non-distended MUSCULOSKELETAL:  No edema; No deformity  SKIN: Warm and dry NEUROLOGIC:  Alert and oriented x 3 PSYCHIATRIC:  Normal affect        ASSESSMENT:    1. Paroxysmal atrial fibrillation (HCC)   2. PREMATURE ATRIAL CONTRACTIONS    PLAN:    In order of problems listed above:  #Paroxysmal atrial fibrillation and atrial tachycardia Doing well after her ablation July 28, 2021. Continue Xarelto for an elevated CHA2DS2-VASc. Continue metoprolol.     Follow-up 1 year with APP.  Total time spent with patient today 20 minutes. This includes reviewing  records, evaluating the patient and coordinating care.   Medication Adjustments/Labs and Tests Ordered: Current medicines are reviewed at length with the patient today.  Concerns regarding medicines are outlined above.  No orders of the defined types were placed in this encounter.  No orders of the defined types were placed in this encounter.   I,Mathew Stumpf,acting as a Education administrator for Vickie Epley, MD.,have documented all relevant documentation on the behalf of Vickie Epley, MD,as directed by  Vickie Epley, MD while in the presence of Vickie Epley, MD.  I, Vickie Epley, MD, have reviewed all documentation for this visit. The documentation on 11/02/21 for the exam, diagnosis, procedures, and orders are all accurate and complete.   Signed, Lars Mage, MD, [facility], [facility] 11/02/2021 2:26 PM    Electrophysiology [facility]

## 2021-11-02 NOTE — Patient Instructions (Signed)
Medication Instructions:  none *If you need a refill on your cardiac medications before your next appointment, please call your pharmacy*   Lab Work: none If you have labs (blood work) drawn today and your tests are completely normal, you will receive your results only by: MyChart Message (if you have MyChart) OR A paper copy in the mail If you have any lab test that is abnormal or we need to change your treatment, we will call you to review the results.   Testing/Procedures: none   Follow-Up: At [facility], you and your health needs are our priority.  As part of our continuing mission to provide you with exceptional heart care, we have created designated Provider Care Teams.  These Care Teams include your primary Cardiologist (physician) and Advanced Practice Providers (APPs -  Physician Assistants and Nurse Practitioners) who all work together to provide you with the care you need, when you need it.  We recommend signing up for the patient portal called "MyChart".  Sign up information is provided on this After Visit Summary.  MyChart is used to connect with patients for Virtual Visits (Telemedicine).  Patients are able to view lab/test results, encounter notes, upcoming appointments, etc.  Non-urgent messages can be sent to your provider as well.   To learn more about what you can do with MyChart, go to [email or web address].    Your next appointment:   1 year(s)  The format for your next appointment:   In Person  Provider:   You will see one of the following Advanced Practice Providers on your designated Care Team:   Renee Ursuy, PA-C Michael "Andy" Tillery, PA-C      Other Instructions none  Important Information About Sugar       

## 2021-11-02 NOTE — Progress Notes (Deleted)
Electrophysiology Office Follow up Visit Note:    Date:  11/02/2021   ID:  Tammy [name], DOB 06-05-1950, MRN [id number]  PCP:  Eulas Post, MD  [facility] Cardiologist:  Candee Furbish, MD  [facility] Electrophysiologist:  Vickie Epley, MD    Interval History:    Tammy [name] is a 71 y.o. female who presents for a follow up visit.  She had an A-fib ablation on July 28, 2021.  During that procedure the veins were isolated.  She is all Ricky in the A-fib clinic for follow-up September 01, 2021.  At that appointment she reported no recurrent A-fib following her ablation.  She uses Xarelto for stroke prophylaxis.       Past Medical History:  Diagnosis Date   Hyperlipidemia     Past Surgical History:  Procedure Laterality Date   ATRIAL FIBRILLATION ABLATION N/A 07/28/2021   Procedure: ATRIAL FIBRILLATION ABLATION;  Surgeon: Vickie Epley, MD;  Location: [facility];  Service: Cardiovascular;  Laterality: N/A;   DILATION AND CURETTAGE OF UTERUS N/A     Current Medications: No outpatient medications have been marked as taking for the 11/02/21 encounter (Appointment) with Vickie Epley, MD.     Allergies:   Novocain [procaine], Penicillins, Codeine, and Contrast media [iodinated contrast media]   Social History   Socioeconomic History   Marital status: Married    Spouse name: Not on file   Number of children: Not on file   Years of education: Not on file   Highest education level: Some college, no degree  Occupational History   Not on file  Tobacco Use   Smoking status: Former    Packs/day: 1.00    Years: 5.00    Total pack years: 5.00    Types: Cigarettes    Quit date: 05/26/1973    Years since quitting: 48.4   Smokeless tobacco: Never   Tobacco comments:    Former smoker 11/17/2020  Vaping Use   Vaping Use: Never used  Substance and Sexual Activity   Alcohol use: Not Currently    Alcohol/week: 1.0 standard drink of alcohol     Types: 1 Glasses of wine per week    Comment: stopped drinking 06/16/21   Drug use: Never   Sexual activity: Not on file  Other Topics Concern   Not on file  Social History Narrative   Not on file   Social Determinants of Health   Financial Resource Strain: Low Risk  (06/17/2021)   Overall Financial Resource Strain (CARDIA)    Difficulty of Paying Living Expenses: Not hard at all  Food Insecurity: No Food Insecurity (06/17/2021)   Hunger Vital Sign    Worried About Running Out of Food in the Last Year: Never true    [place] in the Last Year: Never true  Transportation Needs: No Transportation Needs (06/17/2021)   PRAPARE - Hydrologist (Medical): No    Lack of Transportation (Non-Medical): No  Physical Activity: Unknown (06/17/2021)   Exercise Vital Sign    Days of Exercise per Week: Patient refused    Minutes of Exercise per Session: 70 min  Stress: Stress Concern Present (06/17/2021)   [place]    Feeling of Stress : Rather much  Social Connections: Socially Integrated (06/17/2021)   Social Connection and Isolation Panel [NHANES]    Frequency of Communication with Friends and Family: Once a  week    Frequency of Social Gatherings with Friends and Family: Twice a week    Attends Religious Services: More than 4 times per year    Active Member of [employer] or Organizations: Yes    Attends Music therapist: More than 4 times per year    Marital Status: Married     Family History: The patient's family history includes Cancer in her father, mother, and sister; Heart disease in her father; Heart disease (age of onset: 20) in her mother; Stroke in her father and sister.  ROS:   Please see the history of present illness.    All other systems reviewed and are negative.  EKGs/Labs/Other Studies Reviewed:    The following studies were reviewed today:    EKG:  The ekg  ordered today demonstrates ***  Recent Labs: 03/09/2021: ALT 25; TSH 1.83 07/06/2021: BUN 18; Creatinine, Ser 0.91; Hemoglobin 14.4; Platelets 259; Potassium 4.2; Sodium 137  Recent Lipid Panel    Component Value Date/Time   CHOL 207 (H) 03/09/2021 0959   CHOL 189 06/27/2019 0844   TRIG 86.0 03/09/2021 0959   HDL 55.40 03/09/2021 0959   HDL 63 06/27/2019 0844   CHOLHDL 4 03/09/2021 0959   VLDL 17.2 03/09/2021 0959   LDLCALC 135 (H) 03/09/2021 0959   LDLCALC 113 (H) 06/27/2019 0844   LDLDIRECT 142.6 03/04/2010 1001    Physical Exam:    VS:  LMP  (LMP Unknown)     Wt Readings from Last 3 Encounters:  10/01/21 118 lb 6 oz (53.7 kg)  09/01/21 119 lb (54 kg)  07/28/21 117 lb (53.1 kg)     GEN: *** Well nourished, well developed in no acute distress HEENT: Normal NECK: No JVD; No carotid bruits LYMPHATICS: No lymphadenopathy CARDIAC: ***RRR, no murmurs, rubs, gallops RESPIRATORY:  Clear to auscultation without rales, wheezing or rhonchi  ABDOMEN: Soft, non-tender, non-distended MUSCULOSKELETAL:  No edema; No deformity  SKIN: Warm and dry NEUROLOGIC:  Alert and oriented x 3 PSYCHIATRIC:  Normal affect        ASSESSMENT:    No diagnosis found. PLAN:    In order of problems listed above:  #Paroxysmal atrial fibrillation and atrial tachycardia Doing well after her ablation July 28, 2021. Continue Xarelto for an elevated CHA2DS2-VASc. Continue metoprolol.     Follow-up 1 year with APP.    Total time spent with patient today *** minutes. This includes reviewing records, evaluating the patient and coordinating care.   Medication Adjustments/Labs and Tests Ordered: Current medicines are reviewed at length with the patient today.  Concerns regarding medicines are outlined above.  No orders of the defined types were placed in this encounter.  No orders of the defined types were placed in this encounter.    Signed, Lars Mage, MD, [facility], [facility] 11/02/2021  5:29 AM    Electrophysiology [facility]

## 2021-12-07 DIAGNOSIS — Z809 Family history of malignant neoplasm, unspecified: Secondary | ICD-10-CM | POA: Diagnosis not present

## 2021-12-09 DIAGNOSIS — H40053 Ocular hypertension, bilateral: Secondary | ICD-10-CM | POA: Diagnosis not present

## 2021-12-17 ENCOUNTER — Telehealth: Payer: Self-pay | Admitting: Cardiology

## 2021-12-17 NOTE — Telephone Encounter (Signed)
Pt aware she will need to see Dr Marlou Porch once a yr.   Appt has been scheduled for 03/05/22 at 9:20.  She will c/b before them if any questions or concerns.

## 2021-12-17 NOTE — Telephone Encounter (Signed)
Called patient to schedule 6 month f/u with an APP, pt states that she has been seeing Dr. Quentin Ore and has since had an ablation. She wants to know if it is necessary to continue following with Dr. Marlou Porch, I advised that Dr. Marlou Porch would follow up with her on her general cardiology needs and that he does recommend following up. She would still like to hear from him or his nurse, please advise.

## 2022-01-19 ENCOUNTER — Ambulatory Visit (INDEPENDENT_AMBULATORY_CARE_PROVIDER_SITE_OTHER): Payer: PPO

## 2022-01-19 DIAGNOSIS — Z23 Encounter for immunization: Secondary | ICD-10-CM

## 2022-02-18 DIAGNOSIS — I4819 Other persistent atrial fibrillation: Secondary | ICD-10-CM | POA: Diagnosis not present

## 2022-02-18 DIAGNOSIS — Z800 Family history of malignant neoplasm of digestive organs: Secondary | ICD-10-CM | POA: Diagnosis not present

## 2022-02-24 ENCOUNTER — Other Ambulatory Visit: Payer: Self-pay | Admitting: Family Medicine

## 2022-02-24 DIAGNOSIS — Z1231 Encounter for screening mammogram for malignant neoplasm of breast: Secondary | ICD-10-CM

## 2022-03-05 ENCOUNTER — Ambulatory Visit: Payer: PPO | Attending: Cardiology | Admitting: Cardiology

## 2022-03-05 ENCOUNTER — Encounter: Payer: Self-pay | Admitting: Cardiology

## 2022-03-05 ENCOUNTER — Telehealth: Payer: Self-pay | Admitting: *Deleted

## 2022-03-05 VITALS — BP 118/58 | HR 68 | Ht 65.0 in | Wt 121.0 lb

## 2022-03-05 DIAGNOSIS — E782 Mixed hyperlipidemia: Secondary | ICD-10-CM | POA: Diagnosis not present

## 2022-03-05 DIAGNOSIS — I6523 Occlusion and stenosis of bilateral carotid arteries: Secondary | ICD-10-CM

## 2022-03-05 DIAGNOSIS — I480 Paroxysmal atrial fibrillation: Secondary | ICD-10-CM | POA: Diagnosis not present

## 2022-03-05 NOTE — Telephone Encounter (Signed)
   Pre-operative Risk Assessment    Patient Name: Tammy [name]  DOB: April 17, 1950 MRN: [id number]    [place] FORM BOTH DR. Marlou Porch AND DR. Quentin Ore  Request for Surgical Clearance    Procedure:   COLONOSCOPY  Date of Surgery:  Clearance 04/06/22                                 Surgeon:  DR. Michail Sermon Surgeon's Group or Practice Name:  [facility] Phone number:  [phone] Fax number:  [phone]   Type of Clearance Requested:   - Medical  - Pharmacy:  Hold Rivaroxaban (Xarelto) x 3 DAYS PRIOR   Type of Anesthesia:   PROPOFOL   Additional requests/questions:    Jiles Prows   03/05/2022, 8:55 AM

## 2022-03-05 NOTE — Patient Instructions (Signed)
Medication Instructions:  The current medical regimen is effective;  continue present plan and medications.  *If you need a refill on your cardiac medications before your next appointment, please call your pharmacy*  Follow-Up: At [facility], you and your health needs are our priority.  As part of our continuing mission to provide you with exceptional heart care, we have created designated Provider Care Teams.  These Care Teams include your primary Cardiologist (physician) and Advanced Practice Providers (APPs -  Physician Assistants and Nurse Practitioners) who all work together to provide you with the care you need, when you need it.  We recommend signing up for the patient portal called "MyChart".  Sign up information is provided on this After Visit Summary.  MyChart is used to connect with patients for Virtual Visits (Telemedicine).  Patients are able to view lab/test results, encounter notes, upcoming appointments, etc.  Non-urgent messages can be sent to your provider as well.   To learn more about what you can do with MyChart, go to [email or web address].    Your next appointment:   1 year(s)  Provider:   Mark Skains, MD      

## 2022-03-05 NOTE — Progress Notes (Signed)
Cardiology Office Note   Date:  03/05/2022   ID:  Tammy, [name] Mar 25, 1950, MRN [id number]  PCP:  Tammy Post, MD  Cardiologist:  Tammy Furbish, MD  Electrophysiologist:  Tammy Epley, MD    History of Present Illness:    Tammy [name] is a 72 y.o. female here for the follow-up of atrial fibrillation [name] ablation 07/28/21 Dr. Quentin [name] and hyperlipidemia.  Overall doing well.  Very rare PACs.  She has abstained from wine.  Feels well.  She does state that her diet may have been rich in cholesterol recently.  Would like to forego lipid panel at this time.  This is fine.  She called our office 03/17/2021 and reported palpitations with intermittently irregular heart rhythm and heart rates over 100 bpm. She would try increasing her metoprolol back to 25 mg daily. This had been previously reduced due to fatigue, but she believes her fatigue may have been caused by an illness instead. She was advised to call if she didn't tolerate 25 mg metoprolol and follow-up with cardiology as scheduled.  Atrial fibrillation previous rapid ventricular response of 141 bpm spontaneously converted.  Episode was about 3 hours duration.  CHADSVASc score at that time was 3.  She was placed on Xarelto.  Troponins were normal and EF was normal on echocardiogram.  At her last appointment she continued to suffer from intermittent palpitations/tachycardia occurring at least once per day. Sometimes the episodes will result in rapid heart rate of about 140 beats a minute and then break for 15 seconds or so and then go back to 140 beats a minute.  Previous monitor in June 2021 personally reviewed shows episodes of what appear to be paroxysmal atrial tachycardia.  I do not see any true episodes of atrial fibrillation at that time.  She does enjoy tennis.  One time she did have to stop her again when she was going too quickly.  This sometimes makes her anxious.  She is clearly symptomatic with these  palpitations.  She will take an occasional extra Toprol.  In fact she previously called Tammy Ferries, PA on call who advised her to take her metoprolol but to watch out for any signs of significant hypotension.  She also was infected with Covid, approximately 3 days of cold-like symptoms.  She was fully vaccinated and boosted.  No residual symptoms  She remembers having PACs for many years.  Her father had PACs as well.  She recounts that her father had atrial fibrillation but refused to take anticoagulation and ended up having a stroke.  Her mother had CABG in her 70s. One of her sisters had carotid stenosis bilaterally and a stroke.   Prior visit she reports being in atrial fibrillation, and presents her Apple watch phone log today. She notes these episodes seem to be more frequent lately, and may last for most of the day. In clinic today she felt her "heart going crazy" following her EKG. To her it feels like a "bubbling" in her heart.  Brief since then. PAC's noted. Dr. Quentin [name]  She denies any chest pain, or shortness of breath. No lightheadedness, headaches, syncope, orthopnea, PND, lower extremity edema or exertional symptoms.    Past Medical History:  Diagnosis Date   Hyperlipidemia    Past Surgical History:  Procedure Laterality Date   ATRIAL FIBRILLATION ABLATION N/A 07/28/2021   Procedure: ATRIAL FIBRILLATION ABLATION;  Surgeon: Tammy Epley, MD;  Location: [facility];  Service: Cardiovascular;  Laterality: N/A;   DILATION AND CURETTAGE OF UTERUS N/A      Current Meds  Medication Sig   metoprolol succinate (TOPROL-XL) 25 MG 24 hr tablet TAKE 1 TABLET(25 MG) BY MOUTH DAILY   metoprolol tartrate (LOPRESSOR) 25 MG tablet Take 1 tablet (25 mg total) by mouth as needed.   OVER THE COUNTER MEDICATION Take 2 capsules by mouth 2 (two) times daily. Algae cal +   OVER THE COUNTER MEDICATION Take 2 capsules by mouth at bedtime. Stronsium Boost   PREVIDENT 5000 BOOSTER  PLUS 1.1 % PSTE Place 1 application. onto teeth See admin instructions. 1 daily full mouth and after that brush one tooth daily   sertraline (ZOLOFT) 25 MG tablet Take 1 tablet (25 mg total) by mouth daily.   XARELTO 20 MG TABS tablet TAKE 1 TABLET(20 MG) BY MOUTH DAILY WITH SUPPER     Allergies:   Novocain [procaine], Penicillins, Codeine, and Contrast media [iodinated contrast media]   Social History   Tobacco Use   Smoking status: Former    Packs/day: 1.00    Years: 5.00    Total pack years: 5.00    Types: Cigarettes    Quit date: 05/26/1973    Years since quitting: 48.8   Smokeless tobacco: Never   Tobacco comments:    Former smoker 11/17/2020  Vaping Use   Vaping Use: Never used  Substance Use Topics   Alcohol use: Not Currently    Alcohol/week: 1.0 standard drink of alcohol    Types: 1 Glasses of wine per week    Comment: stopped drinking 06/16/21   Drug use: Never     Family Hx: The patient's family history includes Cancer in her father, mother, and sister; Heart disease in her father; Heart disease (age of onset: 36) in her mother; Stroke in her father and sister.  ROS:   Please see the history of present illness.    (Rare palpitations, no bleeding All other systems reviewed and are negative.   Prior CV studies:   The following studies were reviewed today:   ZIO 2023:  Atrial fibrillation 31% of recording with avg. HR of 88 BPM  Occasional PAC's, rare PVCs  No pauses  Continue with Xarelto  Carotid ultrasound 05/26/2017 showed mild right carotid artery plaque  Echocardiogram 02/19/2016 showed EF of 60% with trivial pericardial effusion   Labs/Other Tests and Data Reviewed:    EKG:   EKG is personally reviewed and interpreted. 03/19/2021: Sinus rhythm. Rate 70 bpm. EKG in 2020 showed normal sinus rhythm with sinus arrhythmia atrial enlargement  Recent Labs: 03/09/2021: ALT 25; TSH 1.83 07/06/2021: BUN 18; Creatinine, Ser 0.91; Hemoglobin 14.4;  Platelets 259; Potassium 4.2; Sodium 137   Recent Lipid Panel Lab Results  Component Value Date/Time   CHOL 207 (H) 03/09/2021 09:59 AM   CHOL 189 06/27/2019 08:44 AM   TRIG 86.0 03/09/2021 09:59 AM   HDL 55.40 03/09/2021 09:59 AM   HDL 63 06/27/2019 08:44 AM   CHOLHDL 4 03/09/2021 09:59 AM   LDLCALC 135 (H) 03/09/2021 09:59 AM   LDLCALC 113 (H) 06/27/2019 08:44 AM   LDLDIRECT 142.6 03/04/2010 10:01 AM    Wt Readings from Last 3 Encounters:  03/05/22 121 lb (54.9 kg)  11/02/21 119 lb 6.4 oz (54.2 kg)  10/01/21 118 lb 6 oz (53.7 kg)     Risk Assessment/Calculations:    CHA2DS2-VASc Score = 3  This indicates a 3.2% annual risk of stroke. The patient's score is based upon:  CHF History: 0 HTN History: 0 Diabetes History: 0 Stroke History: 0 Vascular Disease History: 1 (carotid artery disease) Age Score: 1 Gender Score: 1      Objective:    VS:  BP (!) 118/58   Pulse 68   Ht '5\' 5"'$  (1.651 m)   Wt 121 lb (54.9 kg)   LMP  (LMP Unknown)   SpO2 99%   BMI 20.14 kg/m     Wt Readings from Last 3 Encounters:  03/05/22 121 lb (54.9 kg)  11/02/21 119 lb 6.4 oz (54.2 kg)  10/01/21 118 lb 6 oz (53.7 kg)     GEN: Well nourished, well developed in no acute distress HEENT: Normal NECK: No JVD; No carotid bruits LYMPHATICS: No lymphadenopathy CARDIAC: RRR, no murmurs, rubs, gallops RESPIRATORY:  Clear to auscultation without rales, wheezing or rhonchi  ABDOMEN: Soft, non-tender, non-distended MUSCULOSKELETAL:  No edema; No deformity  SKIN: Warm and dry NEUROLOGIC:  Alert and oriented x 3 PSYCHIATRIC:  Normal affect    ASSESSMENT & PLAN:    Preop cardiac exam She may proceed with colonoscopy with low overall risk.  She may hold her Xarelto for 2 days prior.  Resume [name] colonoscopy per GI.  Atrial fibrillation ([place]) [name] ABLATION Feeling well [name] ablation June 2023 Dr. Quentin [name].  She is continuing with metoprolol XL 12.5 twice a day split.  Echocardiogram  reassuring.  She has decreased her caffeine.  Previously glass of wine may have triggered A-fib.  She has a family history of A. fib Father with stroke after A. fib.    Zio patch 04/10/21 (prior to ablation in June 2023)  Atrial fibrillation 31% of recording with avg. HR of 88 BPM  Occasional PAC's, rare PVCs  No pauses  Continue with Xarelto  Hyperlipidemia Prior LDL 113 checked by her primary physician.  With her mild carotid plaque and family history would benefit from Crestor.  Diet therapy.  At this time she would like to wait on checking the cholesterol again.  Secondary hypercoagulable state ([place]) Continue with Xarelto CHADSVASc 3.  Female, carotid artery plaque Prior creatinine 0.8 hemoglobin 15.  Outside labs.  Carotid artery plaque Mild right carotid artery stenosis previously, no flow limitation  Nonspecific abnormal electrocardiogram (ECG) (EKG) Similar EKGs have shown "septal infarct or anterior infarct pattern, poor R wave progression ".  I went back and reviewed several different EKGs.  I reviewed her EKG just prior to her echocardiogram performed in 2017 and the echocardiogram did not show any evidence of anterior wall motion abnormality.  EKG is demonstrating a false positive likely secondary to lead placement     Medication Adjustments/Labs and Tests Ordered: Current medicines are reviewed at length with the patient today.  Concerns regarding medicines are outlined above.   Tests Ordered: No orders of the defined types were placed in this encounter.  Medication Changes: No orders of the defined types were placed in this encounter.  Follow Up:  Refer for consultation with EP regarding ablation.  Signed, Tammy Furbish, MD  03/05/2022 10:10 AM    [facility]

## 2022-03-05 NOTE — Telephone Encounter (Signed)
Attaching Dr. Marlou Porch' note from in person office visit on 03/05/22. It is not our policy that patients receive separate clearances from general and EP cardiologists, I think Dr. Marlou Porch' note addresses all possible concerns requesting provider may have regarding patient's clearance to undergo colonoscopy.  Emmaline Life, NP-C  03/05/2022, 11:38 AM [address], [address] Office [phone] Fax [phone]

## 2022-03-12 ENCOUNTER — Encounter: Payer: PPO | Admitting: Family Medicine

## 2022-03-26 ENCOUNTER — Other Ambulatory Visit: Payer: Self-pay | Admitting: Cardiology

## 2022-03-26 DIAGNOSIS — I480 Paroxysmal atrial fibrillation: Secondary | ICD-10-CM

## 2022-03-26 NOTE — Telephone Encounter (Signed)
Xarelto '20mg'$  refill request received. Pt is 72 years old, weight-54.9kg, Crea-0.91 on 07/06/2021, last seen by Dr. Quentin Ore on 11/02/2021, Diagnosis-Afib, [place].43 mL/min.   Per last refill message on 09/21/2021 from [place], [place] about dose it states: "Would recommend continuing Xarelto 20 mg daily dosing. Her Cr of 0.91 was slightly above her baseline of 0.73-0.83 going back several years. She is post afib ablation and we do not want her being subtherapeutic with an increased risk of stroke if her CrCl trends back to her baseline. Will reassess on follow up lab work."   Will send in refill to requested pharmacy.

## 2022-04-01 ENCOUNTER — Encounter (HOSPITAL_COMMUNITY): Payer: Self-pay | Admitting: *Deleted

## 2022-04-06 DIAGNOSIS — Z800 Family history of malignant neoplasm of digestive organs: Secondary | ICD-10-CM | POA: Diagnosis not present

## 2022-04-06 DIAGNOSIS — D128 Benign neoplasm of rectum: Secondary | ICD-10-CM | POA: Diagnosis not present

## 2022-04-06 DIAGNOSIS — K648 Other hemorrhoids: Secondary | ICD-10-CM | POA: Diagnosis not present

## 2022-04-06 DIAGNOSIS — Z1211 Encounter for screening for malignant neoplasm of colon: Secondary | ICD-10-CM | POA: Diagnosis not present

## 2022-04-06 LAB — HM COLONOSCOPY

## 2022-04-08 DIAGNOSIS — D128 Benign neoplasm of rectum: Secondary | ICD-10-CM | POA: Diagnosis not present

## 2022-04-13 ENCOUNTER — Ambulatory Visit
Admission: RE | Admit: 2022-04-13 | Discharge: 2022-04-13 | Disposition: A | Discharge status: Home / Self Care | Payer: PPO | Source: Ambulatory Visit | Attending: Family Medicine | Admitting: Family Medicine

## 2022-04-13 DIAGNOSIS — Z1231 Encounter for screening mammogram for malignant neoplasm of breast: Secondary | ICD-10-CM

## 2022-04-26 ENCOUNTER — Other Ambulatory Visit: Payer: Self-pay | Admitting: Cardiology

## 2022-04-26 ENCOUNTER — Telehealth: Payer: Self-pay | Admitting: Cardiology

## 2022-04-26 MED ORDER — METOPROLOL TARTRATE 25 MG PO TABS: 25.0000 mg | ORAL_TABLET | ORAL | 11 refills | Status: DC | PRN

## 2022-04-26 NOTE — Telephone Encounter (Signed)
Pt c/o medication issue:  1. Name of Medication:   metoprolol tartrate (LOPRESSOR) 25 MG tablet    2. How are you currently taking this medication (dosage and times per day)?   3. Are you having a reaction (difficulty breathing--STAT)?   4. What is your medication issue? Pharmacy called in stating the instructions for this prescription need to be more specific for insurance purposes. She states she cannot run it" as needed" it needs to have a max and frequency

## 2022-05-27 ENCOUNTER — Encounter: Payer: PPO | Admitting: Family Medicine

## 2022-06-07 ENCOUNTER — Telehealth: Payer: Self-pay | Admitting: Family Medicine

## 2022-06-07 NOTE — Telephone Encounter (Signed)
Contacted [facility] Lunger [facility]ougle to schedule their annual wellness visit. Appointment made for 06/10/22.  Rudell Cobb AWV direct phone # [phone]

## 2022-06-07 NOTE — Telephone Encounter (Signed)
Called patient to schedule Medicare Annual Wellness Visit (AWV). Left message for patient to call back and schedule Medicare Annual Wellness Visit (AWV).  Last date of AWV: 05/25/21  Please schedule an appointment at any time with [facility] or [employer].  If any questions, please contact me at [phone].  Thank you ,  Rudell Cobb AWV direct phone # [phone]

## 2022-06-10 ENCOUNTER — Telehealth (INDEPENDENT_AMBULATORY_CARE_PROVIDER_SITE_OTHER): Payer: PPO | Admitting: Family Medicine

## 2022-06-10 ENCOUNTER — Encounter: Payer: Self-pay | Admitting: Family Medicine

## 2022-06-10 VITALS — BP 123/73 | HR 77 | Ht 65.0 in | Wt 121.0 lb

## 2022-06-10 DIAGNOSIS — Z0000 Encounter for general adult medical examination without abnormal findings: Secondary | ICD-10-CM

## 2022-06-10 NOTE — Progress Notes (Signed)
PATIENT CHECK-IN and HEALTH RISK ASSESSMENT QUESTIONNAIRE:  -completed by phone/video for upcoming Medicare Preventive Visit  Pre-Visit Check-in: 1)Vitals (height, wt, BP, etc) - record in vitals section for visit on day of visit 2)Review and Update Medications, Allergies PMH, Surgeries, Social history in Epic 3)Hospitalizations in the last year with date/reason? July 28, 2021 had heart ablation  4)Review and Update Care Team (patient's specialists) in Epic 5) Complete PHQ9 in Epic  6) Complete Fall Screening in Epic 7)Review all Health Maintenance Due and order under PCP if not done.  8)Medicare Wellness Questionnaire: Answer theses question about your habits: Do you drink alcohol? no If yes, how many drinks do you have a day?n/a Have you ever smoked?yes Quit date if applicable? 05/26/1973  How many packs a day do/did you smoke? A pk or less a day Do you use smokeless tobacco?no Do you use an illicit drugs?no Do you exercises? Walk, play tennis, play pickle ball IF so, what type and how many days/minutes per week? 3-4 days of the wks, walk 2 miles. 30-40 minutes, 1hr to 1hr1/2 when playing tennis/pickleball Are you sexually active? No Number of partners? Typical breakfast: oatmeal/fruits/walnut/cherios Typical lunch: turkey/spinach sandwich or sometimes leftover from the night Typical dinner: varies but last night- bbq chicken, roasted brocolli. Typical snacks:chips, fruits, peanut butter cracker. Kind breakfast  Beverages: water and coffee. Once in while few sip of soda  Answer theses question about you: Can you perform most household chores?yes  Do you find it hard to follow a conversation in a noisy room?no Do you often ask people to speak up or repeat themselves?no Do you feel that you have a problem with memory? no Do you balance your checkbook and or bank acounts? yes  Do you feel safe at home? yes Last dentist visit?January 2024 on [facility] Do you  need assistance with any of the following: Please note if so No  Driving?  Feeding yourself?  Getting from bed to chair?  Getting to the toilet?  Bathing or showering?  Dressing yourself?  Managing money?  Climbing a flight of stairs  Preparing meals?  Do you have Advanced Directives in place (Living Will, Healthcare Power or Attorney)? Yes   Last eye Exam and location?October of 2023, with [facility]. Going back next week.    Do you currently use prescribed or non-prescribed narcotic or opioid pain medications?No  Do you have a history or close family history of breast, ovarian, tubal or peritoneal cancer or a family member with BRCA (breast cancer susceptibility 1 and 2) gene mutations? Aunt on mother's side had ovarian cancer.   Nurse/Assistant Credentials/time stamp: Karpuih Moyun/CMA/ 2:22pm   ----------------------------------------------------------------------------------------------------------------------------------------------------------------------------------------------------------------------   MEDICARE ANNUAL PREVENTIVE VISIT WITH PROVIDER: (Welcome to [employer], initial annual wellness or annual wellness exam)  Virtual Visit via Video Note  I connected with Ulyses Southward on 06/10/22 by a video enabled telemedicine application and verified that I am speaking with the correct person using two identifiers.  Location patient: home Location provider:work or home office Persons participating in the virtual visit: patient, provider  Concerns and/or follow up today: no concerns   See HM section in Epic for other details of completed HM.    ROS: negative for report of fevers, unintentional weight loss, vision changes, vision loss, hearing loss or change, chest pain, sob, hemoptysis, melena, hematochezia, hematuria, falls, bleeding or bruising, thoughts of suicide or self harm, memory loss  Patient-completed extensive health risk assessment -  reviewed and discussed with  the patient: See Health Risk Assessment completed with patient prior to the visit either above or in recent phone note. This was reviewed in detailed with the patient today and appropriate recommendations, orders and referrals were placed as needed per Summary below and patient instructions.   Review of Medical History: -PMH, PSH, Family History and current specialty and care providers reviewed and updated and listed below   Patient Care Team: Kristian Covey, MD as PCP - General Jake Bathe, MD as PCP - Cardiology (Cardiology) Lanier Prude, MD as PCP - Electrophysiology (Cardiology)   Past Medical History:  Diagnosis Date   Hyperlipidemia     Past Surgical History:  Procedure Laterality Date   ATRIAL FIBRILLATION ABLATION N/A 07/28/2021   Procedure: ATRIAL FIBRILLATION ABLATION;  Surgeon: Lanier Prude, MD;  Location: [facility];  Service: Cardiovascular;  Laterality: N/A;   DILATION AND CURETTAGE OF UTERUS N/A     Social History   Socioeconomic History   Marital status: Married    Spouse name: Not on file   Number of children: Not on file   Years of education: Not on file   Highest education level: Some college, no degree  Occupational History   Not on file  Tobacco Use   Smoking status: Former    Packs/day: 1.00    Years: 5.00    Additional pack years: 0.00    Total pack years: 5.00    Types: Cigarettes    Quit date: 05/26/1973    Years since quitting: 49.0   Smokeless tobacco: Never   Tobacco comments:    Former smoker 11/17/2020  Vaping Use   Vaping Use: Never used  Substance and Sexual Activity   Alcohol use: Not Currently    Alcohol/week: 1.0 standard drink of alcohol    Types: 1 Glasses of wine per week    Comment: stopped drinking 06/16/21   Drug use: Never   Sexual activity: Not on file  Other Topics Concern   Not on file  Social History Narrative   Not on file   Social Determinants of Health    Financial Resource Strain: Low Risk  (06/17/2021)   Overall Financial Resource Strain (CARDIA)    Difficulty of Paying Living Expenses: Not hard at all  Food Insecurity: No Food Insecurity (06/17/2021)   Hunger Vital Sign    Worried About Running Out of Food in the Last Year: Never true    Ran Out of Food in the Last Year: Never true  Transportation Needs: No Transportation Needs (06/17/2021)   PRAPARE - Administrator, Civil Service (Medical): No    Lack of Transportation (Non-Medical): No  Physical Activity: Unknown (06/17/2021)   Exercise Vital Sign    Days of Exercise per Week: Patient declined    Minutes of Exercise per Session: Not on file  Stress: Stress Concern Present (06/17/2021)   [employer] of Occupational Health - Occupational Stress Questionnaire    Feeling of Stress : Rather much  Social Connections: Socially Integrated (06/17/2021)   Social Connection and Isolation Panel [NHANES]    Frequency of Communication with Friends and Family: Once a week    Frequency of Social Gatherings with Friends and Family: Twice a week    Attends Religious Services: More than 4 times per year    Active Member of [employer] or Organizations: Yes    Attends Engineer, structural: More than 4 times per year    Marital Status: Married  Intimate Partner Violence: Not At Risk (05/25/2021)   Humiliation, Afraid, Rape, and Kick questionnaire    Fear of Current or Ex-Partner: No    Emotionally Abused: No    Physically Abused: No    Sexually Abused: No    Family History  Problem Relation Age of Onset   Heart disease Mother 19       CABG   Cancer Mother        bladder cancer   Heart disease Father        MVP, atrial fibrilation   Cancer Father        colon cancer   Stroke Father    Cancer Sister        colon cancer   Stroke Sister     Current Outpatient Medications on File Prior to Visit  Medication Sig Dispense Refill   metoprolol succinate (TOPROL-XL) 25 MG 24  hr tablet TAKE 1 TABLET(25 MG) BY MOUTH DAILY 90 tablet 3   metoprolol tartrate (LOPRESSOR) 25 MG tablet Take 1 tablet (25 mg total) by mouth as needed (once daily as needed for break through At Fib/tachycardia). 30 tablet 11   OVER THE COUNTER MEDICATION Take 2 capsules by mouth 2 (two) times daily. Algae cal +     OVER THE COUNTER MEDICATION Take 2 capsules by mouth at bedtime. Stronsium Boost     PREVIDENT 5000 BOOSTER PLUS 1.1 % PSTE Place 1 application. onto teeth See admin instructions. 1 daily full mouth and after that brush one tooth daily     sertraline (ZOLOFT) 25 MG tablet Take 1 tablet (25 mg total) by mouth daily. 90 tablet 3   XARELTO 20 MG TABS tablet TAKE 1 TABLET(20 MG) BY MOUTH DAILY WITH SUPPER 90 tablet 1   No current facility-administered medications on file prior to visit.    Allergies  Allergen Reactions   Novocain [Procaine] Palpitations   Penicillins Rash   Codeine Other (See Comments)    Pt. States "crazy dreams"   Contrast Media [Iodinated Contrast Media] Itching    Eye Swelling       Physical Exam Vitals:   06/10/22 1409  BP: 123/73  Pulse: 77   Estimated body mass index is 20.14 kg/m as calculated from the following:   Height as of this encounter: 5\' 5"  (1.651 m).   Weight as of this encounter: 121 lb (54.9 kg).  EKG (optional): deferred due to virtual visit  GENERAL: alert, oriented, no acute distress detected, full vision exam deferred due to pandemic and/or virtual encounter   HEENT: atraumatic, conjunttiva clear, no obvious abnormalities on inspection of external nose and ears  NECK: normal movements of the head and neck  LUNGS: on inspection no signs of respiratory distress, breathing rate appears normal, no obvious gross SOB, gasping or wheezing  CV: no obvious cyanosis  MS: moves all visible extremities without noticeable abnormality  PSYCH/NEURO: pleasant and cooperative, no obvious depression or anxiety, speech and thought  processing grossly intact, Cognitive function grossly intact  Flowsheet Row Office Visit from [facility]/26/[facility] in [facility]  PHQ-9 Total Score 0           06/10/2022    2:10 PM 05/25/2021    3:25 PM 03/09/2021    9:51 AM 05/16/2020    1:30 PM 01/26/2019    9:00 AM  Depression screen PHQ 2/9  Decreased Interest 0 0 0 0 0  Down, Depressed, Hopeless 0 0 0 0 0  PHQ -  2 Score 0 0 0 0 0  Altered sleeping     0  Tired, decreased energy     0  Change in appetite     0  Feeling bad or failure about yourself      0  Trouble concentrating     0  Moving slowly or fidgety/restless     0  Suicidal thoughts     0  PHQ-9 Score     0  Difficult doing work/chores     Not difficult at all       05/23/2021    8:19 PM 05/25/2021    3:30 PM 06/17/2021   12:04 PM 07/28/2021   10:57 AM 06/10/2022    2:10 PM  Fall Risk  Falls in the past year? 0 0 0  0  Was there an injury with Fall? 0 0   0  Fall Risk Category Calculator  0   0  Fall Risk Category (Retired)  Low     (RETIRED) Patient Fall Risk Level  Low fall risk  High fall risk   Patient at Risk for Falls Due to  No Fall Risks   No Fall Risks  Fall risk Follow up     Falls evaluation completed     SUMMARY AND PLAN:  Encounter for Medicare annual wellness exam    Discussed applicable health maintenance/preventive health measures and advised and referred or ordered per patient preferences: -discussed the vaccines due and she knows she can get them at the pharmacy if she wishes Health Maintenance  Topic Date Due   Zoster Vaccines- Shingrix (1 of 2) Never done   COVID-19 Vaccine (7 - 2023-24 season) 10/23/2021   INFLUENZA VACCINE  09/23/2022   MAMMOGRAM  04/14/2023   Medicare Annual Wellness (AWV)  06/10/2023   DTaP/Tdap/Td (4 - Td or Tdap) 04/30/2024   COLONOSCOPY (Pts 45-96yrs Insurance coverage will need to be confirmed)  04/07/2027   Pneumonia Vaccine 63+ Years old  Completed   DEXA SCAN  Completed    Hepatitis C Screening  Completed   HPV VACCINES  Aged Out   Education and counseling on the following was provided based on the above review of health and a plan/checklist for the patient, along with additional information discussed, was provided for the patient in the patient instructions :  - -Advised and counseled on a healthy lifestyle - including the importance of a healthy diet, regular physical activity, social connections and stress management. -Reviewed patient's current diet. Advised and counseled on a whole foods based healthy diet. A summary of a healthy diet was provided in the Patient Instructions. She seems to be eating a pretty healthy diet already and congratulated and encouraged.  -reviewed patient's current physical activity level and discussed exercise guidelines for adults. She is doing great and congratulated and encouraged to continue her regular healthy exercise habits. Provided community resources and ideas for safe exercise at home to assist in meeting exercise guideline recommendations in a safe and healthy way.  -Advise yearly dental visits at minimum and regular eye exams   Follow up: see patient instructions     Patient Instructions  I really enjoyed getting to talk with you today! I am available on Tuesdays and Thursdays for virtual visits if you have any questions or concerns, or if I can be of any further assistance.   CHECKLIST FROM ANNUAL WELLNESS VISIT:  -Follow up (please call to schedule if not scheduled after visit):   -yearly for annual  wellness visit with primary care office  Here is a list of your preventive care/health maintenance measures and the plan for each if any are due:  PLAN For any measures below that may be due:  -can get the shingles and covid boosters at the pharmacy if you wish  Health Maintenance  Topic Date Due   Zoster Vaccines- Shingrix (1 of 2) Never done   COVID-19 Vaccine (7 - 2023-24 season) 10/23/2021   INFLUENZA  VACCINE  09/23/2022   MAMMOGRAM  04/14/2023   Medicare Annual Wellness (AWV)  06/10/2023   DTaP/Tdap/Td (4 - Td or Tdap) 04/30/2024   COLONOSCOPY (Pts 45-52yrs Insurance coverage will need to be confirmed)  04/07/2027   Pneumonia Vaccine 33+ Years old  Completed   DEXA SCAN  Completed   Hepatitis C Screening  Completed   HPV VACCINES  Aged Out    -See a dentist at least yearly  -Get your eyes checked and then per your eye specialist's recommendations  -Other issues addressed today:   -I have included below further information regarding a healthy whole foods based diet, physical activity guidelines for adults, stress management and opportunities for social connections. I hope you find this information useful.   -----------------------------------------------------------------------------------------------------------------------------------------------------------------------------------------------------------------------------------------------------------  NUTRITION: -eat real food: lots of colorful vegetables (half the plate) and fruits -5-7 servings of vegetables and fruits per day (fresh or steamed is best), exp. 2 servings of vegetables with lunch and dinner and 2 servings of fruit per day. Berries and greens such as kale and collards are great choices.  -consume on a regular basis: whole grains (make sure first ingredient on label contains the word "whole"), fresh fruits, fish, nuts, seeds, healthy oils (such as olive oil, avocado oil, grape seed oil) -may eat small amounts of dairy and lean meat on occasion, but avoid processed meats such as ham, bacon, lunch meat, etc. -drink water -try to avoid fast food and pre-packaged foods, processed meat -most experts advise limiting sodium to <  per day, should limit further is any chronic conditions such as high blood pressure, heart disease, diabetes, etc. The American Heart Association advised that <  is is ideal -try to  avoid foods that contain any ingredients with names you do not recognize  -try to avoid sugar/sweets (except for the natural sugar that occurs in fresh fruit) -try to avoid sweet drinks -try to avoid white rice, white bread, pasta (unless whole grain), white or yellow potatoes  EXERCISE GUIDELINES FOR ADULTS: -if you wish to increase your physical activity, do so gradually and with the approval of your doctor -STOP and seek medical care immediately if you have any chest pain, chest discomfort or trouble breathing when starting or increasing exercise  -move and stretch your body, legs, feet and arms when sitting for long periods -Physical activity guidelines for optimal health in adults: -least 150 minutes per week of aerobic exercise (can talk, but not sing) once approved by your doctor, 20-30 minutes of sustained activity or two 10 minute episodes of sustained activity every day.  -resistance training at least 2 days per week if approved by your doctor -balance exercises 3+ days per week:   Stand somewhere where you have something sturdy to hold onto if you lose balance.    1) lift up on toes, start with 5x per day and work up to 20x   2) stand and lift on leg straight out to the side so that foot is a few inches of the floor, start with  5x each side and work up to 20x each side   3) stand on one foot, start with 5 seconds each side and work up to 20 seconds on each side  If you need ideas or help with getting more active:  -Silver sneakers https://tools.silversneakers.com  -Walk with a Doc: http://www.duncan-williams.com/  -try to include resistance (weight lifting/strength building) and balance exercises twice per week: or the following link for ideas: http://castillo-powell.com/  BuyDucts.dk  STRESS MANAGEMENT: -can try meditating, or just sitting quietly with deep breathing while intentionally  relaxing all parts of your body for 5 minutes daily -if you need further help with stress, anxiety or depression please follow up with your primary doctor or contact the wonderful folks at [facility]: [phone]  SOCIAL CONNECTIONS: -options in [place] if you wish to engage in more social and exercise related activities:  -Silver sneakers https://tools.silversneakers.com  -Walk with a Doc: http://www.duncan-williams.com/  -Check out the [facility] Active Adults 50+ section on the [place] of [employer] (hiking clubs, book clubs, cards and games, chess, exercise classes, aquatic classes and much more) - see the website for details: https://www.[place].gov/departments/parks-recreation/active-adults50  -YouTube has lots of exercise videos for different ages and abilities as well  -[facility] (a variety of indoor and outdoor inperson activities for adults). [phone]. [address]  -Virtual Online Classes (a variety of topics): see seniorplanet.org or call [phone]  -consider volunteering at a school, hospice center, church, senior center or elsewhere           Terressa Koyanagi, DO

## 2022-06-10 NOTE — Patient Instructions (Signed)
I really enjoyed getting to talk with you today! I am available on Tuesdays and Thursdays for virtual visits if you have any questions or concerns, or if I can be of any further assistance.   CHECKLIST FROM ANNUAL WELLNESS VISIT:  -Follow up (please call to schedule if not scheduled after visit):   -yearly for annual wellness visit with primary care office  Here is a list of your preventive care/health maintenance measures and the plan for each if any are due:  PLAN For any measures below that may be due:  -can get the shingles and covid boosters at the pharmacy if you wish  Health Maintenance  Topic Date Due   Zoster Vaccines- Shingrix (1 of 2) Never done   COVID-19 Vaccine (7 - 2023-24 season) 10/23/2021   INFLUENZA VACCINE  09/23/2022   MAMMOGRAM  04/14/2023   Medicare Annual Wellness (AWV)  06/10/2023   DTaP/Tdap/Td (4 - Td or Tdap) 04/30/2024   COLONOSCOPY (Pts 45-60yrs Insurance coverage will need to be confirmed)  04/07/2027   Pneumonia Vaccine 26+ Years old  Completed   DEXA SCAN  Completed   Hepatitis C Screening  Completed   HPV VACCINES  Aged Out    -See a dentist at least yearly  -Get your eyes checked and then per your eye specialist's recommendations  -Other issues addressed today:   -I have included below further information regarding a healthy whole foods based diet, physical activity guidelines for adults, stress management and opportunities for social connections. I hope you find this information useful.   -----------------------------------------------------------------------------------------------------------------------------------------------------------------------------------------------------------------------------------------------------------  NUTRITION: -eat real food: lots of colorful vegetables (half the plate) and fruits -5-7 servings of vegetables and fruits per day (fresh or steamed is best), exp. 2 servings of vegetables with lunch and  dinner and 2 servings of fruit per day. Berries and greens such as kale and collards are great choices.  -consume on a regular basis: whole grains (make sure first ingredient on label contains the word "whole"), fresh fruits, fish, nuts, seeds, healthy oils (such as olive oil, avocado oil, grape seed oil) -may eat small amounts of dairy and lean meat on occasion, but avoid processed meats such as ham, bacon, lunch meat, etc. -drink water -try to avoid fast food and pre-packaged foods, processed meat -most experts advise limiting sodium to <  per day, should limit further is any chronic conditions such as high blood pressure, heart disease, diabetes, etc. The American Heart Association advised that <  is is ideal -try to avoid foods that contain any ingredients with names you do not recognize  -try to avoid sugar/sweets (except for the natural sugar that occurs in fresh fruit) -try to avoid sweet drinks -try to avoid white rice, white bread, pasta (unless whole grain), white or yellow potatoes  EXERCISE GUIDELINES FOR ADULTS: -if you wish to increase your physical activity, do so gradually and with the approval of your doctor -STOP and seek medical care immediately if you have any chest pain, chest discomfort or trouble breathing when starting or increasing exercise  -move and stretch your body, legs, feet and arms when sitting for long periods -Physical activity guidelines for optimal health in adults: -least 150 minutes per week of aerobic exercise (can talk, but not sing) once approved by your doctor, 20-30 minutes of sustained activity or two 10 minute episodes of sustained activity every day.  -resistance training at least 2 days per week if approved by your doctor -balance exercises 3+ days per week:  Stand somewhere where you have something sturdy to hold onto if you lose balance.    1) lift up on toes, start with 5x per day and work up to 20x   2) stand and lift on leg  straight out to the side so that foot is a few inches of the floor, start with 5x each side and work up to 20x each side   3) stand on one foot, start with 5 seconds each side and work up to 20 seconds on each side  If you need ideas or help with getting more active:  -Silver sneakers https://tools.silversneakers.com  -Walk with a Doc: http://Kirwan-thompson.biz/  -try to include resistance (weight lifting/strength building) and balance exercises twice per week: or the following link for ideas: ChessContest.fr  UpdateClothing.com.cy  STRESS MANAGEMENT: -can try meditating, or just sitting quietly with deep breathing while intentionally relaxing all parts of your body for 5 minutes daily -if you need further help with stress, anxiety or depression please follow up with your primary doctor or contact the wonderful folks at [place]: [place]: -options in [place] if you wish to engage in more social and exercise related activities:  -Silver sneakers https://tools.silversneakers.com  -Walk with a Doc: http://Poli-thompson.biz/  -Check out the [place] 50+ section on the [place] of [employer] (hiking clubs, book clubs, cards and games, chess, exercise classes, aquatic classes and much more) - see the website for details: https://www.[place].gov/departments/parks-recreation/active-adults50  -YouTube has lots of exercise videos for different ages and abilities as well  -[place] (a variety of indoor and outdoor inperson activities for adults). [phone]. [address].  -Virtual Online Classes (a variety of topics): see seniorplanet.org or call [phone]  -consider volunteering at a school, hospice center, church, senior center or elsewhere

## 2022-06-29 ENCOUNTER — Telehealth: Payer: Self-pay

## 2022-06-29 ENCOUNTER — Other Ambulatory Visit: Payer: Self-pay | Admitting: Cardiology

## 2022-06-29 DIAGNOSIS — Z0000 Encounter for general adult medical examination without abnormal findings: Secondary | ICD-10-CM

## 2022-06-29 DIAGNOSIS — I480 Paroxysmal atrial fibrillation: Secondary | ICD-10-CM | POA: Diagnosis not present

## 2022-06-29 NOTE — Telephone Encounter (Signed)
Patient was returning a phone call to the anticoagulation clinic, but dialed the wrong number. Patient stated that she was going to retry her call.   Renaee Munda, MS, ERHD, [facility]  Care Guide, Health & Wellness Coach [address] [place] [address] Telephone: [phone] Email: [email or web address]

## 2022-06-29 NOTE — Telephone Encounter (Signed)
Prescription refill request for Xarelto received.  Indication: afib  Last office visit: [place], 03/05/2022 Weight: 54.9 kg  Age: 72 yo  Scr: 0.91, 07/06/2021 CrCl: 48 ml/min    Pt is due for blood work and could qualify for a dose change. Called pt and LMOM.

## 2022-06-29 NOTE — Telephone Encounter (Signed)
Pt will call PCP to see if she can have labs drawn soon.

## 2022-06-29 NOTE — Telephone Encounter (Signed)
Called and spoke to pt who stated that her PCP is out right now and will not be available until June. Pt will come to the [facility] office for blood work. Pt stated that she would try to go by tomorrow. Lab orders placed.

## 2022-06-30 LAB — CBC
Hematocrit: 41.8 % (ref 34.0–46.6)
Hemoglobin: 14 g/dL (ref 11.1–15.9)
MCH: 31.7 pg (ref 26.6–33.0)
MCHC: 33.5 g/dL (ref 31.5–35.7)
MCV: 95 fL (ref 79–97)
Platelets: 272 10*3/uL (ref 150–450)
RBC: 4.41 x10E6/uL (ref 3.77–5.28)
RDW: 12.1 % (ref 11.7–15.4)
WBC: 6.6 10*3/uL (ref 3.4–10.8)

## 2022-06-30 LAB — BASIC METABOLIC PANEL
BUN/Creatinine Ratio: 26 (ref 12–28)
BUN: 21 mg/dL (ref 8–27)
CO2: 22 mmol/L (ref 20–29)
Calcium: 8.7 mg/dL (ref 8.7–10.3)
Chloride: 103 mmol/L (ref 96–106)
Creatinine, Ser: 0.8 mg/dL (ref 0.57–1.00)
Glucose: 91 mg/dL (ref 70–99)
Potassium: 4 mmol/L (ref 3.5–5.2)
Sodium: 142 mmol/L (ref 134–144)
eGFR: 78 mL/min/{1.73_m2} (ref >59)

## 2022-06-30 NOTE — Telephone Encounter (Signed)
Xarelto 20mg  refill request received. Pt is 72 years old, weight-54.9kg, Crea-0.80 on 06/29/22, last seen by Dr. Anne Fu on 03/05/22, Diagnosis-Afib, CrCl-55.09 mL/min; Dose is appropriate based on dosing criteria. Will send in refill to requested pharmacy.     Called pt and left a message that her xarelto refill has been sent.

## 2022-08-06 ENCOUNTER — Encounter: Payer: Self-pay | Admitting: Pharmacist

## 2022-08-06 DIAGNOSIS — Z9189 Other specified personal risk factors, not elsewhere classified: Secondary | ICD-10-CM

## 2022-08-06 NOTE — Progress Notes (Signed)
[facility])     [facility] Quality Pharmacy Team Statin Quality Measure Assessment  08/06/2022  Tammy [name] [birth year] [id number]  Per review of chart and payor information, patient has a diagnosis of cardiovascular disease but is not currently filling a statin prescription. This places patient into the [facility] (Statin Use In Patients with Cardiovascular Disease) measure for CMS.    Patient declined statin therapy in the past. She has an upcoming appointment 08/09/2022.  If deemed therapeutically appropriate, statin therapy could be assessed at the upcoming appointment. If Patient has experienced statin intolerance, a statin exclusion code could be associated with the upcoming visit.     Component Value Date/Time   CHOL 207 (H) 03/09/2021 0959   CHOL 189 06/27/2019 0844   TRIG 86.0 03/09/2021 0959   HDL 55.40 03/09/2021 0959   HDL 63 06/27/2019 0844   CHOLHDL 4 03/09/2021 0959   VLDL 17.2 03/09/2021 0959   LDLCALC 135 (H) 03/09/2021 0959   LDLCALC 113 (H) 06/27/2019 0844   LDLDIRECT 142.6 03/04/2010 1001    Please consider ONE of the following recommendations:  Initiate high intensity statin Atorvastatin 40mg  once daily, #90, 3 refills   Rosuvastatin 20mg  once daily, #90, 3 refills    Initiate moderate intensity  statin with reduced frequency if prior  statin intolerance 1x weekly, #13, 3 refills   2x weekly, #26, 3 refills   3x weekly, #39, 3 refills    Code for past statin intolerance  (required annually)  Provider Requirements: Must associate code during an office visit or telehealth encounter   Drug Induced Myopathy G72.0   Myalgia (SPC ONLY) M79.1   Myositis, unspecified M60.9   Myopathy, unspecified G72.9   Rhabdomyolysis M62.82    Plan: Route note to Patient's PCP prior to the upcoming visit.   Beecher Mcardle, PharmD, BCACP [facility] Clinical Pharmacist [phone]

## 2022-08-09 ENCOUNTER — Ambulatory Visit (INDEPENDENT_AMBULATORY_CARE_PROVIDER_SITE_OTHER): Payer: PPO | Admitting: Family Medicine

## 2022-08-09 ENCOUNTER — Encounter: Payer: Self-pay | Admitting: Family Medicine

## 2022-08-09 VITALS — BP 126/60 | HR 72 | Temp 98.0°F | Ht 65.55 in | Wt 120.4 lb

## 2022-08-09 DIAGNOSIS — Z0000 Encounter for general adult medical examination without abnormal findings: Secondary | ICD-10-CM

## 2022-08-09 NOTE — Patient Instructions (Signed)
Consider Shingrix (shingles vaccine).

## 2022-08-09 NOTE — Progress Notes (Signed)
Established Patient Office Visit  Subjective   Patient ID: Tammy [name], female    DOB: 30-Nov-1950  Age: 72 y.o. MRN: [id number]  Chief Complaint  Patient presents with   Annual Exam    HPI   Tammy [name] seen today for well exam/physical exam.  She had ablation procedure about a year ago for atrial fibrillation and has remained out of A-fib since then.  She remains on low-dose Toprol-XL.  She does have occasional PACs.  Stays quite active with walking.  She is followed by GYN.  She states she had DEXA scan last year.  Her numbers have apparently improved with algae Cal.  She remains on low-dose sertraline for anxiety symptoms and that seems to be working well.  No depression.  Also remains on Xarelto regarding her atrial fibrillation history.  She had recent CBC and basic metabolic panel through cardiology and these were normal.  Health maintenance reviewed  Health Maintenance  Topic Date Due   Zoster Vaccines- Shingrix (1 of 2) Never done   COVID-19 Vaccine (7 - 2023-24 season) 10/23/2021   INFLUENZA VACCINE  09/23/2022   MAMMOGRAM  04/14/2023   Medicare Annual Wellness (AWV)  06/10/2023   DTaP/Tdap/Td (4 - Td or Tdap) 04/30/2024   Colonoscopy  04/07/2027   Pneumonia Vaccine 71+ Years old  Completed   DEXA SCAN  Completed   Hepatitis C Screening  Completed   HPV VACCINES  Aged Out   Family history significant for sister and father with colon cancer.  Her father had history of atrial fibrillation.  Mother with bladder cancer and CAD around age 22.  Her mother was a smoker   Married with 2 children and 3 grandchildren.  Non-smoker.  No alcohol.  Enjoys tennis.  Review of Systems  Constitutional:  Negative for chills, fever, malaise/fatigue and weight loss.  HENT:  Negative for hearing loss.   Eyes:  Negative for blurred vision and double vision.  Respiratory:  Negative for cough and shortness of breath.   Cardiovascular:  Negative for chest pain, palpitations and leg  swelling.  Gastrointestinal:  Negative for abdominal pain, blood in stool, constipation and diarrhea.  Genitourinary:  Negative for dysuria.  Skin:  Negative for rash.  Neurological:  Negative for dizziness, speech change, seizures, loss of consciousness and headaches.  Psychiatric/Behavioral:  Negative for depression.       Objective:     BP 126/60 (BP Location: Left Arm, Patient Position: Sitting, Cuff Size: Normal)   Pulse 72   Temp 98 F (36.7 C) (Oral)   Ht 5' 5.55" (1.665 m)   Wt 120 lb 6.4 oz (54.6 kg)   LMP  (LMP Unknown)   SpO2 97%   BMI 19.70 kg/m  BP Readings from Last 3 Encounters:  08/09/22 126/60  06/10/22 123/73  03/05/22 (!) 118/58   Wt Readings from Last 3 Encounters:  08/09/22 120 lb 6.4 oz (54.6 kg)  06/10/22 121 lb (54.9 kg)  03/05/22 121 lb (54.9 kg)      Physical Exam Vitals reviewed.  Constitutional:      General: She is not in acute distress.    Appearance: She is well-developed. She is not ill-appearing.  HENT:     Head: Normocephalic and atraumatic.  Eyes:     Pupils: Pupils are equal, round, and reactive to light.  Neck:     Thyroid: No thyromegaly.  Cardiovascular:     Rate and Rhythm: Normal rate and regular rhythm.  Heart sounds: Normal heart sounds. No murmur heard. Pulmonary:     Effort: No respiratory distress.     Breath sounds: Normal breath sounds. No wheezing or rales.  Abdominal:     General: Bowel sounds are normal. There is no distension.     Palpations: Abdomen is soft. There is no mass.     Tenderness: There is no abdominal tenderness. There is no guarding or rebound.  Musculoskeletal:        General: Normal range of motion.     Cervical back: Normal range of motion and neck supple.     Right lower leg: No edema.     Left lower leg: No edema.  Lymphadenopathy:     Cervical: No cervical adenopathy.  Skin:    Findings: No rash.  Neurological:     Mental Status: She is alert and oriented to person, place, and  time.     Cranial Nerves: No cranial nerve deficit.  Psychiatric:        Behavior: Behavior normal.        Thought Content: Thought content normal.        Judgment: Judgment normal.      No results found for any visits on 08/09/22.  Last CBC Lab Results  Component Value Date   WBC 6.6 06/29/2022   HGB 14.0 06/29/2022   HCT 41.8 06/29/2022   MCV 95 06/29/2022   MCH 31.7 06/29/2022   RDW 12.1 06/29/2022   PLT 272 06/29/2022   Last metabolic panel Lab Results  Component Value Date   GLUCOSE 91 06/29/2022   NA 142 06/29/2022   K 4.0 06/29/2022   CL 103 06/29/2022   CO2 22 06/29/2022   BUN 21 06/29/2022   CREATININE 0.80 06/29/2022   EGFR 78 06/29/2022   CALCIUM 8.7 06/29/2022   PROT 7.6 03/09/2021   ALBUMIN 4.3 03/09/2021   BILITOT 0.6 03/09/2021   ALKPHOS 65 03/09/2021   AST 22 03/09/2021   ALT 25 03/09/2021   ANIONGAP 8 01/20/2016   Last lipids Lab Results  Component Value Date   CHOL 207 (H) 03/09/2021   HDL 55.40 03/09/2021   LDLCALC 135 (H) 03/09/2021   LDLDIRECT 142.6 03/04/2010   TRIG 86.0 03/09/2021   CHOLHDL 4 03/09/2021   Last hemoglobin A1c No results found for: "HGBA1C" Last thyroid functions Lab Results  Component Value Date   TSH 1.83 03/09/2021      The 10-year ASCVD risk score (Arnett DK, et al., 2019) is: 11.5%    Assessment & Plan:   Problem List Items Addressed This Visit   None Visit Diagnoses     Physical exam    -  Primary     Generally healthy 72 year old female.  History of atrial fibrillation status post prior ablation.  He does have history of osteoporosis and has done well on allergy cal with improved DEXA scans.  She is conscious of regular weightbearing exercise.  We discussed following health maintenance items  -Consider Shingrix vaccine.  She has not had prior shingles vaccine and knows her lifetime risk is about 1 and 3. -She will continue follow-up with GYN regarding DEXA scans as well as mammogram -Continue  regular weightbearing exercise -Consider follow-up lipids next year though her previous imaging showed coronary calcium score of 0 and carotid Dopplers only very minimal plaque in her ICA bilaterally -She does had repeat colonoscopy this past winter and will be due for repeat in 5 years -Continue annual flu vaccine -Tetanus due  2026  No follow-ups on file.    Evelena Peat, MD

## 2022-09-29 ENCOUNTER — Other Ambulatory Visit: Payer: Self-pay | Admitting: Family Medicine

## 2022-10-04 ENCOUNTER — Telehealth: Payer: Self-pay | Admitting: *Deleted

## 2022-10-04 DIAGNOSIS — M21611 Bunion of right foot: Secondary | ICD-10-CM | POA: Diagnosis not present

## 2022-10-04 DIAGNOSIS — M2022 Hallux rigidus, left foot: Secondary | ICD-10-CM | POA: Diagnosis not present

## 2022-10-04 DIAGNOSIS — M21612 Bunion of left foot: Secondary | ICD-10-CM | POA: Diagnosis not present

## 2022-10-04 NOTE — Telephone Encounter (Signed)
   Pre-operative Risk Assessment    Patient Name: Tammy [name]  DOB: 06-29-1950 MRN: [id number]      Request for Surgical Clearance    Procedure:   Left Hallux MPJ arthrodesis; silver bunionectomy  Date of Surgery:  Clearance TBD                                 Surgeon:  Dr. Toni Arthurs Surgeon's Group or Practice Name:  [facility] Phone number:  [phone] Fax number:  [phone]   Type of Clearance Requested:   - Medical  - Pharmacy:  Hold Rivaroxaban (Xarelto) Not Indicated   Type of Anesthesia:  General    Additional requests/questions:    Signed, Emmit Pomfret   10/04/2022, 10:23 AM

## 2022-10-04 NOTE — Telephone Encounter (Signed)
   Name: Tammy [name]  DOB: Dec 06, 1950  MRN: [id number]  Primary Cardiologist: Donato Schultz, MD  Chart reviewed as part of pre-operative protocol coverage. Because of [name]'s past medical history and time since last visit, she will require a follow-up in-office visit in order to better assess preoperative cardiovascular risk.  Pre-op covering staff: - Please schedule appointment and call patient to inform them. If patient already had an upcoming appointment within acceptable timeframe, please add "pre-op clearance" to the appointment notes so provider is aware. - Please contact requesting surgeon's office via preferred method (i.e, phone, fax) to inform them of need for appointment prior to surgery.  Per office protocol, patient can hold Xarelto for 2 days prior to procedure.   Patient will not need bridging with Lovenox (enoxaparin) around procedure.    Sharlene Dory, PA-C  10/04/2022, 4:02 PM

## 2022-10-04 NOTE — Telephone Encounter (Signed)
Patient with diagnosis of atrial fibrillation on Xarelto for anticoagulation.    Procedure:   Left Hallux MPJ arthrodesis; silver bunionectomy   Date of Surgery:  Clearance TBD   CHA2DS2-VASc Score = 3   This indicates a 3.2% annual risk of stroke. The patient's score is based upon: CHF History: 0 HTN History: 0 Diabetes History: 0 Stroke History: 0 Vascular Disease History: 1 Age Score: 1 Gender Score: 1    CrCl 66 Platelet count 272  Per office protocol, patient can hold Xarelto for 2 days prior to procedure.   Patient will not need bridging with Lovenox (enoxaparin) around procedure.  **This guidance is not considered finalized until pre-operative APP has relayed final recommendations.**

## 2022-10-05 NOTE — Telephone Encounter (Signed)
I s/w the pt and she tells me that she really is not planning on this surgery until probably the first of the year 2025. Pt sates she has a lot going on right now, moving and new grandbaby coming in Nov 2024. We left it that the pt will call us back when she is ready to proceed with the surgery. If more than 2-3 months, will be best if surgeon's office faxes a new request. I will update the surgeon's office and the pre op APP as well.

## 2022-11-09 DIAGNOSIS — Z86018 Personal history of other benign neoplasm: Secondary | ICD-10-CM | POA: Diagnosis not present

## 2022-11-09 DIAGNOSIS — D2272 Melanocytic nevi of left lower limb, including hip: Secondary | ICD-10-CM | POA: Diagnosis not present

## 2022-11-09 DIAGNOSIS — L820 Inflamed seborrheic keratosis: Secondary | ICD-10-CM | POA: Diagnosis not present

## 2022-11-09 DIAGNOSIS — L821 Other seborrheic keratosis: Secondary | ICD-10-CM | POA: Diagnosis not present

## 2022-11-09 DIAGNOSIS — D225 Melanocytic nevi of trunk: Secondary | ICD-10-CM | POA: Diagnosis not present

## 2022-11-09 DIAGNOSIS — L570 Actinic keratosis: Secondary | ICD-10-CM | POA: Diagnosis not present

## 2022-11-09 DIAGNOSIS — L578 Other skin changes due to chronic exposure to nonionizing radiation: Secondary | ICD-10-CM | POA: Diagnosis not present

## 2022-11-10 DIAGNOSIS — Z124 Encounter for screening for malignant neoplasm of cervix: Secondary | ICD-10-CM | POA: Diagnosis not present

## 2022-11-10 DIAGNOSIS — Z681 Body mass index (BMI) 19 or less, adult: Secondary | ICD-10-CM | POA: Diagnosis not present

## 2022-12-15 ENCOUNTER — Other Ambulatory Visit (HOSPITAL_COMMUNITY): Payer: Self-pay | Admitting: Orthopedic Surgery

## 2022-12-15 NOTE — Telephone Encounter (Signed)
I d/w the pre op APP today Edd Fabian, FNP about pre op clearance appt. Per preop APP today to add to appt notes for appt with Dr. Lalla Brothers the pt needs pre op clearance.   I will update all parties involved.

## 2022-12-16 DIAGNOSIS — H40053 Ocular hypertension, bilateral: Secondary | ICD-10-CM | POA: Diagnosis not present

## 2022-12-21 NOTE — Progress Notes (Unsigned)
  Electrophysiology Office Follow up Visit Note:    Date:  12/22/2022   ID:  Tammy, [name] 03-Aug-1950, MRN [id number]  PCP:  Kristian Covey, MD  [facility] Cardiologist:  Donato Schultz, MD  [facility] Electrophysiologist:  Lanier Prude, MD    Interval History:     Tammy [name] is a 72 y.o. female who presents for a follow up visit.   Discussed the use of AI scribe software for clinical note transcription with the patient, who gave verbal consent to proceed.  History of Present Illness   Tammy [name], with a history of atrial fibrillation managed with Xarelto and Toprol XL, presents for follow up. She had a prior afib ablation on July 28, 2021 during which the pulmonary veins were isolated. She reports no full blown AFib and fewer PACs. She tolerates Xarelto well, but experiences fatigue on metoprolol, which she splits and takes twice daily.            Past medical, surgical, social and family history were reviewed.  ROS:   Please see the history of present illness.    All other systems reviewed and are negative.  EKGs/Labs/Other Studies Reviewed:    The following studies were reviewed today:  EKG Interpretation Date/Time:  Wednesday December 22 2022 15:03:54 EDT Ventricular Rate:  67 PR Interval:  136 QRS Duration:  80 QT Interval:  400 QTC Calculation: 422 R Axis:   81  Text Interpretation: Normal sinus rhythm Confirmed by Steffanie Dunn [id number]) on 12/22/2022 3:37:45 PM    Physical Exam:    VS:  BP 114/69   Pulse 67   Ht 5' 5.55" (1.665 m)   Wt 120 lb 6.4 oz (54.6 kg)   LMP  (LMP Unknown)   SpO2 97%   BMI 19.70 kg/m     Wt Readings from Last 3 Encounters:  12/22/22 120 lb 6.4 oz (54.6 kg)  08/09/22 120 lb 6.4 oz (54.6 kg)  06/10/22 121 lb (54.9 kg)    Physical Exam   GENERAL: Appears younger than stated age. CHEST: Lungs clear to auscultation. CARDIOVASCULAR: Regular rate and rhythm.           ASSESSMENT:    1.  Paroxysmal atrial fibrillation (HCC)   2. PREMATURE ATRIAL CONTRACTIONS    PLAN:    In order of problems listed above:  Assessment and Plan    Atrial Fibrillation Post-ablation with no reported episodes of AFib. Currently on Xarelto and Metoprolol. Patient reports fatigue potentially related to Metoprolol. -Reduce Metoprolol to 12.5mg  PO QHS -Continue Xarelto for stroke prophylaxis. -Consider future options for stroke prophylaxis such as Watchman device      Follow up in 1 year or sooner if AFib symptoms return.               Signed, Steffanie Dunn, MD, [facility], [facility] 12/22/2022 8:14 PM    Electrophysiology [facility]

## 2022-12-22 ENCOUNTER — Encounter: Payer: Self-pay | Admitting: Cardiology

## 2022-12-22 ENCOUNTER — Ambulatory Visit: Payer: PPO | Attending: Cardiology | Admitting: Cardiology

## 2022-12-22 VITALS — BP 114/69 | HR 67 | Ht 65.55 in | Wt 120.4 lb

## 2022-12-22 DIAGNOSIS — I480 Paroxysmal atrial fibrillation: Secondary | ICD-10-CM

## 2022-12-22 DIAGNOSIS — I491 Atrial premature depolarization: Secondary | ICD-10-CM

## 2022-12-22 MED ORDER — RIVAROXABAN 20 MG PO TABS: ORAL_TABLET | ORAL | 3 refills | Status: DC

## 2022-12-22 NOTE — Patient Instructions (Signed)
 Medication Instructions:  Your physician recommends that you continue on your current medications as directed. Please refer to the Current Medication list given to you today.  *If you need a refill on your cardiac medications before your next appointment, please call your pharmacy*  Follow-Up: At [facility], you and your health needs are our priority.  As part of our continuing mission to provide you with exceptional heart care, we have created designated Provider Care Teams.  These Care Teams include your primary Cardiologist (physician) and Advanced Practice Providers (APPs -  Physician Assistants and Nurse Practitioners) who all work together to provide you with the care you need, when you need it.  Your next appointment:   1 year  Provider:   You will see one of the following Advanced Practice Providers on your designated Care Team:   Francis Dowse, Charlott Holler [address]" [place], [place] Sherie Don, NP Canary Brim, NP

## 2022-12-23 MED ORDER — METOPROLOL SUCCINATE ER 25 MG PO TB24: 12.5000 mg | ORAL_TABLET | Freq: Every day | ORAL | 3 refills | Status: DC

## 2022-12-23 NOTE — Addendum Note (Signed)
Addended by: Frutoso Schatz on: 12/23/2022 07:26 AM   Modules accepted: Orders

## 2023-01-25 ENCOUNTER — Encounter: Payer: Self-pay | Admitting: Cardiology

## 2023-01-25 ENCOUNTER — Telehealth: Payer: Self-pay | Admitting: *Deleted

## 2023-01-25 NOTE — Telephone Encounter (Signed)
Tammy [name]  P Cv Div Ch St Triage (supporting Jake Bathe, MD)33 minutes ago (1:49 PM)    I will.  I was surprised that she suggested I do it as well..thanks.    Levi Aland, NP  Tammy [name] [phone] minutes ago (1:45 PM)    Please ask Dr. Laverta [place] office to fax [place] a specific request (they do this frequently) regarding the type of surgery and additional details and we will respond appropriately.      Sharin Grave, RN routed conversation to [place]; Cv Div Preop49 minutes ago (1:33 PM)   Tammy Burows, RN routed conversation to Sharin Grave, RN1 hour ago (12:44 PM)   Tammy [name]  P Cv Div Ch St Triage (supporting Jake Bathe, MD)1 hour ago (12:38 PM)    Good Morning.  I am scheduled to have foot surgery on January 9th with Dr. Victorino Dike.  When I spoke with his nurse re. stopping Xarelto she said that they would call your office about that but suggested I should contact you as well.  Could you tell me when I should stop taking Xarelto prior to surgery and when to start taking it afterwards.  Thank you.

## 2023-01-25 NOTE — Telephone Encounter (Signed)
We will await clearance request to come from Dr. Laverta Baltimore office for surgery set for 03/03/23.

## 2023-01-31 NOTE — Telephone Encounter (Signed)
Please advise on holding of Xarelto prior to ARTHRODESIS METATARSAL PHALANGEAL JOINT, LEFT SILVER BUNIONECTOMY on 03/03/23. Thank you

## 2023-01-31 NOTE — Telephone Encounter (Signed)
   Pre-operative Risk Assessment    Patient Name: Tammy [name]  DOB: Oct 14, 1950 MRN: [id number]  DATE OF LAST VISIT: 12/22/22 DR. Lalla Brothers DATE OF NEXT VISIT: NONE    Request for Surgical Clearance    Procedure:   ARTHRODESIS METATARSAL PHALANGEAL JOINT, LEFT SILVER BUNIONECTOMY   Date of Surgery:  Clearance 03/03/23                                 Surgeon:  DR. Jonny Ruiz HEWITT Surgeon's Group or Practice Name:  [facility] Phone number:  [phone] Fax number:  [phone]   Type of Clearance Requested:   - Medical  - Pharmacy:  Hold Rivaroxaban (Xarelto)     Type of Anesthesia:  General    Additional requests/questions:    [name]   01/31/2023, 1:56 PM

## 2023-02-01 NOTE — Telephone Encounter (Signed)
Patient with diagnosis of afib on Xarelto for anticoagulation.    Procedure: ARTHRODESIS METATARSAL PHALANGEAL JOINT, LEFT SILVER BUNIONECTOMY  Date of procedure: 03/03/23   CHA2DS2-VASc Score = 3   This indicates a 3.2% annual risk of stroke. The patient's score is based upon: CHF History: 0 HTN History: 0 Diabetes History: 0 Stroke History: 0 Vascular Disease History: 1 Age Score: 1 Gender Score: 1      CrCl 53 ml/min  Per office protocol, patient can hold Xarelto for 2 days prior to procedure.    **This guidance is not considered finalized until pre-operative APP has relayed final recommendations.**

## 2023-02-01 NOTE — Telephone Encounter (Signed)
S/w pt scheduled ov with Dr. Anne Fu on December 20.  Will send to requesting surgeons office and remove from pool

## 2023-02-01 NOTE — Telephone Encounter (Signed)
   Name: Tammy [name]  DOB: 1950/07/10  MRN: [id number]  Primary Cardiologist: Donato Schultz, MD  Chart reviewed as part of pre-operative protocol coverage. Because of [name]'s past medical history and time since last visit, she will require a follow-up in-office visit in order to better assess preoperative cardiovascular risk.  Pre-op covering staff: - Please schedule appointment and call patient to inform them. If patient already had an upcoming appointment within acceptable timeframe, please add "pre-op clearance" to the appointment notes so provider is aware. - Please contact requesting surgeon's office via preferred method (i.e, phone, fax) to inform them of need for appointment prior to surgery.  Per office protocol, patient can hold Xarelto for 2 days prior to procedure.  Please resume a medic safe to do so.    Sharlene Dory, PA-C  02/01/2023, 8:58 AM

## 2023-02-07 ENCOUNTER — Ambulatory Visit (INDEPENDENT_AMBULATORY_CARE_PROVIDER_SITE_OTHER): Payer: PPO

## 2023-02-07 DIAGNOSIS — Z23 Encounter for immunization: Secondary | ICD-10-CM

## 2023-02-11 ENCOUNTER — Ambulatory Visit: Payer: PPO | Attending: Cardiology | Admitting: Cardiology

## 2023-02-11 ENCOUNTER — Encounter: Payer: Self-pay | Admitting: Cardiology

## 2023-02-11 VITALS — BP 110/70 | HR 65 | Ht 65.5 in | Wt 119.8 lb

## 2023-02-11 DIAGNOSIS — I4891 Unspecified atrial fibrillation: Secondary | ICD-10-CM

## 2023-02-11 DIAGNOSIS — R9431 Abnormal electrocardiogram [ECG] [EKG]: Secondary | ICD-10-CM | POA: Diagnosis not present

## 2023-02-11 DIAGNOSIS — Z01810 Encounter for preprocedural cardiovascular examination: Secondary | ICD-10-CM

## 2023-02-11 NOTE — Patient Instructions (Signed)
Medication Instructions:  The current medical regimen is effective;  continue present plan and medications.  *If you need a refill on your cardiac medications before your next appointment, please call your pharmacy*   Follow-Up: At [facility], you and your health needs are our priority.  As part of our continuing mission to provide you with exceptional heart care, we have created designated Provider Care Teams.  These Care Teams include your primary Cardiologist (physician) and Advanced Practice Providers (APPs -  Physician Assistants and Nurse Practitioners) who all work together to provide you with the care you need, when you need it.  We recommend signing up for the patient portal called "MyChart".  Sign up information is provided on this After Visit Summary.  MyChart is used to connect with patients for Virtual Visits (Telemedicine).  Patients are able to view lab/test results, encounter notes, upcoming appointments, etc.  Non-urgent messages can be sent to your provider as well.   To learn more about what you can do with MyChart, go to [email or web address].    Your next appointment:   1 year(s)  Provider:   Donato Schultz, MD       OK for surgery per Dr Anne Fu.  May hold Xarelto for 3 days prior to surgery if spinal block is needed.  Otherwise, hold for 2 days prior and resume as soon as possible.

## 2023-02-11 NOTE — Progress Notes (Signed)
Cardiology Office Note:  .   Date:  02/11/2023  ID:  Tammy [name], DOB Jul 21, 1950, MRN [id number] PCP: Kristian Covey, MD  [facility] Providers Cardiologist:  Donato Schultz, MD Electrophysiologist:  Lanier Prude, MD     History of Present Illness: .   Tammy [name] is a 72 y.o. female Discussed with the use of AI scribe   History of Present Illness   The 72 year old patient, with a history of atrial fibrillation, is scheduled for bunion surgery.    She underwent successful cardiac ablation therapy in June 2023 (Dr. Lalla Brothers), and has since maintained sinus rhythm. Her medication regimen includes Xarelto 20mg  daily for chronic anticoagulation and Metoprolol, which was recently reduced to 12.5mg  daily. She remains active, enjoying tennis and pickleball. Prior to the ablation, a Zio monitor showed 31% atrial fibrillation. A carotid ultrasound in 2019 revealed mild carotid plaque, and an echocardiogram showed an EF of 60%. Her CHADS-VASc score is 3, and LDL cholesterol is 135.  The patient expressed concern about the possibility of going into AFib during surgery, especially if she was off blood thinners. She reported experiencing a few PACs recently and had a brief episode of AFib during a stressful period about a month ago. She also mentioned a reduction in Metoprolol dosage due to fatigue, which seemed to improve her energy levels. The patient is aware of the need to hold Xarelto for a few days prior to surgery, depending on the type of anesthesia used. No CP, Plays Tennis/Pickleball, >4 METS of activity.      Daughter is CRNA at [facility] [employer] [facility] outpatient.     Studies Reviewed: Marland Kitchen   EKG Interpretation Date/Time:  Friday February 11 2023 10:47:49 EST Ventricular Rate:  65 PR Interval:  138 QRS Duration:  80 QT Interval:  400 QTC Calculation: 416 R Axis:   62  Text Interpretation: Normal sinus rhythm Septal Q waves no change from prior (cited on or before  11-Feb-2023) When compared with ECG of 11-Feb-2023 10:44, No significant change was found Confirmed by Donato Schultz ([id number]) on 02/11/2023 10:55:55 AM    Results   LABS LDL cholesterol: 135  RADIOLOGY Cardiac CT pulmonary vein study: Coronary calcium score of 0 (07/22/2021) Carotid ultrasound: Mild carotid plaque (2019)  DIAGNOSTIC EKG: Sinus rhythm (02/11/2023) Zio monitor: 31% atrial fibrillation (2023) - prior to ablation Echocardiogram: EF 60%     Risk Assessment/Calculations:            Physical Exam:   VS:  BP 110/70   Pulse 65   Ht 5' 5.5" (1.664 m)   Wt 119 lb 12.8 oz (54.3 kg)   LMP  (LMP Unknown)   SpO2 97%   BMI 19.63 kg/m    Wt Readings from Last 3 Encounters:  02/11/23 119 lb 12.8 oz (54.3 kg)  12/22/22 120 lb 6.4 oz (54.6 kg)  08/09/22 120 lb 6.4 oz (54.6 kg)    GEN: Well nourished, well developed in no acute distress NECK: No JVD; No carotid bruits CARDIAC: RRR, no murmurs, no rubs, no gallops RESPIRATORY:  Clear to auscultation without rales, wheezing or rhonchi  ABDOMEN: Soft, non-tender, non-distended EXTREMITIES:  No edema; No deformity   ASSESSMENT AND PLAN: .    Assessment and Plan    Preoperative Evaluation for Bunion Surgery 72 year old scheduled for bunion surgery. Concerned about AFib risk during surgery, especially if not on anticoagulation. Managed with Xarelto and metoprolol. Reassured that AFib risk during surgery for her should be  low and manageable. Discussed holding Xarelto for 2-3 days prior to surgery depending on anesthesia type, with resumption post-surgery once stable. Patient prefers to proceed with surgery due to significant discomfort from bunions. - Hold Xarelto for 2-3 days prior to surgery depending on anesthesia type (spinal 3 days) - Resume Xarelto approximately one day post-surgery once stable - Proceed with surgery as planned, low risk cardiac  Atrial Fibrillation 31% AFib on Zio monitor in 2023. Underwent ablation on  07/28/2021, maintaining sinus rhythm on EKG. On Xarelto 20 mg daily for chronic anticoagulation and metoprolol 12.5 mg daily, reduced due to fatigue. Discussed recent PACs and brief AFib episodes during stress. Patient prefers to maintain current metoprolol dosage until after surgery. - Continue Xarelto 20 mg daily - Continue metoprolol 12.5 mg daily - Monitor for symptoms of AFib, especially during stressful periods  General Health Maintenance Active, engaging in tennis and pickleball. Mild carotid plaque (2019) and echocardiogram showing EF of 60%. LDL cholesterol 135 mg/dL, CHADS-VASc score 3. Low cardiac risk for upcoming surgery. - Annual follow-up in one year  Follow-up - Schedule follow-up appointment in one year.              Signed, Donato Schultz, MD

## 2023-02-24 ENCOUNTER — Other Ambulatory Visit: Payer: Self-pay

## 2023-02-24 ENCOUNTER — Encounter (HOSPITAL_BASED_OUTPATIENT_CLINIC_OR_DEPARTMENT_OTHER): Payer: Self-pay | Admitting: Orthopedic Surgery

## 2023-03-02 NOTE — H&P (Addendum)
 Tammy [name] is an 73 y.o. female.   Chief Complaint: Left forefoot pain HPI: Patient is a 73 year old female that presents to the OR today for elective treatment for her chronic left forefoot pain.  She has painful bunion deformity with underlying degenerative changes at the hallux MP joint.  She has failed nonoperative treatment including activity modification, oral anti-inflammatories, and shoewear modification.  Allergies:  Allergies  Allergen Reactions   Novocain [Procaine] Palpitations   Penicillins Rash   Codeine Other (See Comments)    Pt. States crazy dreams   Contrast Media [Iodinated Contrast Media] Itching    Eye Swelling    Past Medical History:  Diagnosis Date   Hyperlipidemia    PONV (postoperative nausea and vomiting)     Past Surgical History:  Procedure Laterality Date   ATRIAL FIBRILLATION ABLATION N/A 07/28/2021   Procedure: ATRIAL FIBRILLATION ABLATION;  Surgeon: Cindie Ole DASEN, MD;  Location: [facility];  Service: Cardiovascular;  Laterality: N/A;   DILATION AND CURETTAGE OF UTERUS N/A     Family History: Family History  Problem Relation Age of Onset   Heart disease Mother 56       CABG   Cancer Mother        bladder cancer   Heart disease Father        MVP, atrial fibrilation   Cancer Father        colon cancer   Stroke Father    Cancer Sister        colon cancer   Stroke Sister     Social History:   reports that she quit smoking about 49 years ago. Her smoking use included cigarettes. She started smoking about 54 years ago. She has a 5 pack-year smoking history. She has never used smokeless tobacco. She reports that she does not currently use alcohol after a past usage of about 1.0 standard drink of alcohol per week. She reports that she does not use drugs.  Medications: No medications prior to admission.    No results found for this or any previous visit (from the past 48 hours).  No results found.    Height 5' 5.5  (1.664 m), weight 54.4 kg.  PE:  well nourished and well developed.  NAD.  EOMI.  Resp unlabored.  Left bunion deformity.  Tender to palpation over the hallux MP joint dorsally.  Assessment/Plan Left bunion deformity and hallux rigidus  Patient presents to the OR today for definitive treatment of her chronic left bunion deformity and underlying hallux rigidus.  She we will require left hallux MP joint arthrodesis with silver bunionectomy.  After reviewing the procedure, postoperative protocol, and risk of surgery the patient elects for surgical intervention.  The patient specifically understands risks of bleeding, infection, nerve damage, blood clots, need for additional surgery, continued pain, nonunion, post traumatic arthritis, recurrence of deformity, amputation and death.   Eva Barrack PA-C [facility] Office:  [phone]   No changes to findings documented above.  To the OR today for left hallux MPJ arthrodesis.  The risks and benefits of the alternative treatment options have been discussed in detail.  The patient wishes to proceed with surgery and specifically understands risks of bleeding, infection, nerve damage, blood clots, need for additional surgery, amputation and death.

## 2023-03-02 NOTE — Progress Notes (Signed)
 Gave patient pre-surgical drink and instructed her to finish by 0430 on the morning of surgery. Patient verbalized understanding

## 2023-03-02 NOTE — Anesthesia Preprocedure Evaluation (Addendum)
 Anesthesia Evaluation  Patient identified by MRN, date of birth, ID band Patient awake    Reviewed: Allergy & Precautions, NPO status , Patient's Chart, lab work & pertinent test results  History of Anesthesia Complications (+) PONV and history of anesthetic complications  Airway Mallampati: II  TM Distance: >3 FB Neck ROM: Full    Dental no notable dental hx. (+) Teeth Intact, Dental Advisory Given   Pulmonary former smoker   Pulmonary exam normal breath sounds clear to auscultation       Cardiovascular Normal cardiovascular exam Rhythm:Regular Rate:Normal     Neuro/Psych    GI/Hepatic   Endo/Other    Renal/GU      Musculoskeletal   Abdominal   Peds  Hematology   Anesthesia Other Findings All: PCN, Codiene, Contrast  Reproductive/Obstetrics                             Anesthesia Physical Anesthesia Plan  ASA: 2  Anesthesia Plan: General and Regional   Post-op Pain Management: Regional block*, Minimal or no pain anticipated, Tylenol  PO (pre-op)* and Precedex    Induction: Intravenous  PONV Risk Score and Plan: 4 or greater and Propofol  infusion, TIVA, Treatment may vary due to age or medical condition and Ondansetron   Airway Management Planned: LMA  Additional Equipment: None  Intra-op Plan:   Post-operative Plan: Extubation in OR  Informed Consent: I have reviewed the patients History and Physical, chart, labs and discussed the procedure including the risks, benefits and alternatives for the proposed anesthesia with the patient or authorized representative who has indicated his/her understanding and acceptance.     Dental advisory given  Plan Discussed with:   Anesthesia Plan Comments: (LMA TIVA w L adductor + L popliteal)        Anesthesia Quick Evaluation

## 2023-03-03 ENCOUNTER — Other Ambulatory Visit: Payer: Self-pay

## 2023-03-03 ENCOUNTER — Ambulatory Visit (HOSPITAL_BASED_OUTPATIENT_CLINIC_OR_DEPARTMENT_OTHER)
Admission: RE | Admit: 2023-03-03 | Discharge: 2023-03-03 | Disposition: A | Discharge status: Home / Self Care | Payer: PPO | Attending: Orthopedic Surgery | Admitting: Orthopedic Surgery

## 2023-03-03 ENCOUNTER — Ambulatory Visit (HOSPITAL_BASED_OUTPATIENT_CLINIC_OR_DEPARTMENT_OTHER): Payer: PPO | Admitting: Anesthesiology

## 2023-03-03 ENCOUNTER — Encounter (HOSPITAL_BASED_OUTPATIENT_CLINIC_OR_DEPARTMENT_OTHER): Payer: Self-pay | Admitting: Orthopedic Surgery

## 2023-03-03 ENCOUNTER — Ambulatory Visit (HOSPITAL_BASED_OUTPATIENT_CLINIC_OR_DEPARTMENT_OTHER): Payer: PPO

## 2023-03-03 ENCOUNTER — Encounter (HOSPITAL_BASED_OUTPATIENT_CLINIC_OR_DEPARTMENT_OTHER)
Admission: RE | Disposition: A | Discharge status: Home / Self Care | Payer: Self-pay | Source: Home / Self Care | Attending: Orthopedic Surgery

## 2023-03-03 DIAGNOSIS — M21612 Bunion of left foot: Secondary | ICD-10-CM | POA: Insufficient documentation

## 2023-03-03 DIAGNOSIS — E785 Hyperlipidemia, unspecified: Secondary | ICD-10-CM

## 2023-03-03 DIAGNOSIS — G8918 Other acute postprocedural pain: Secondary | ICD-10-CM | POA: Diagnosis not present

## 2023-03-03 DIAGNOSIS — Z87891 Personal history of nicotine dependence: Secondary | ICD-10-CM | POA: Diagnosis not present

## 2023-03-03 DIAGNOSIS — M79672 Pain in left foot: Secondary | ICD-10-CM | POA: Diagnosis present

## 2023-03-03 DIAGNOSIS — I4891 Unspecified atrial fibrillation: Secondary | ICD-10-CM

## 2023-03-03 DIAGNOSIS — M2022 Hallux rigidus, left foot: Secondary | ICD-10-CM | POA: Diagnosis not present

## 2023-03-03 HISTORY — PX: ARTHRODESIS METATARSALPHALANGEAL JOINT (MTPJ): SHX6566

## 2023-03-03 HISTORY — DX: Cardiac arrhythmia, unspecified: I49.9

## 2023-03-03 HISTORY — PX: BUNIONECTOMY: SHX129

## 2023-03-03 HISTORY — DX: Other specified postprocedural states: Z98.890

## 2023-03-03 SURGERY — FUSION, JOINT, GREAT TOE
Anesthesia: Regional | Site: Toe | Laterality: Left

## 2023-03-03 MED ORDER — CEFAZOLIN SODIUM-DEXTROSE 2-4 GM/100ML-% IV SOLN
INTRAVENOUS | Status: AC
  Filled 2023-03-03: qty 100

## 2023-03-03 MED ORDER — VANCOMYCIN HCL 500 MG IV SOLR
INTRAVENOUS | Status: AC
  Filled 2023-03-03: qty 10

## 2023-03-03 MED ORDER — PROPOFOL 10 MG/ML IV BOLUS
INTRAVENOUS | Status: AC
  Filled 2023-03-03: qty 20

## 2023-03-03 MED ORDER — PROPOFOL 10 MG/ML IV BOLUS
INTRAVENOUS | Status: DC | PRN
  Administered 2023-03-03: 40 mg via INTRAVENOUS
  Administered 2023-03-03: 100 mg via INTRAVENOUS

## 2023-03-03 MED ORDER — 0.9 % SODIUM CHLORIDE (POUR BTL) OPTIME
TOPICAL | Status: DC | PRN
  Administered 2023-03-03: 1000 mL

## 2023-03-03 MED ORDER — CLONIDINE HCL (ANALGESIA) 100 MCG/ML EP SOLN
EPIDURAL | Status: DC | PRN
  Administered 2023-03-03 (×2): 100 ug

## 2023-03-03 MED ORDER — ROPIVACAINE HCL 5 MG/ML IJ SOLN
INTRAMUSCULAR | Status: DC | PRN
  Administered 2023-03-03: 25 mL via PERINEURAL
  Administered 2023-03-03: 15 mL via PERINEURAL

## 2023-03-03 MED ORDER — VANCOMYCIN HCL 500 MG IV SOLR
INTRAVENOUS | Status: DC | PRN
  Administered 2023-03-03: 500 mg

## 2023-03-03 MED ORDER — ACETAMINOPHEN 10 MG/ML IV SOLN: 1000.0000 mg | Freq: Once | INTRAVENOUS | Status: DC | PRN

## 2023-03-03 MED ORDER — PROPOFOL 500 MG/50ML IV EMUL
INTRAVENOUS | Status: DC | PRN
  Administered 2023-03-03: 150 ug/kg/min via INTRAVENOUS

## 2023-03-03 MED ORDER — CEFAZOLIN SODIUM-DEXTROSE 2-4 GM/100ML-% IV SOLN
2.0000 g | INTRAVENOUS | Status: AC
  Administered 2023-03-03: 2 g via INTRAVENOUS

## 2023-03-03 MED ORDER — DOCUSATE SODIUM 100 MG PO CAPS: 100.0000 mg | ORAL_CAPSULE | Freq: Two times a day (BID) | ORAL | 0 refills | Status: DC

## 2023-03-03 MED ORDER — FENTANYL CITRATE (PF) 100 MCG/2ML IJ SOLN
INTRAMUSCULAR | Status: AC
  Filled 2023-03-03: qty 2

## 2023-03-03 MED ORDER — DEXAMETHASONE SODIUM PHOSPHATE 10 MG/ML IJ SOLN
INTRAMUSCULAR | Status: AC
  Filled 2023-03-03: qty 1

## 2023-03-03 MED ORDER — SODIUM CHLORIDE 0.9 % IV SOLN: INTRAVENOUS | Status: DC | PRN

## 2023-03-03 MED ORDER — LIDOCAINE HCL (CARDIAC) PF 100 MG/5ML IV SOSY
PREFILLED_SYRINGE | INTRAVENOUS | Status: DC | PRN
  Administered 2023-03-03: 100 mg via INTRAVENOUS

## 2023-03-03 MED ORDER — DEXAMETHASONE SODIUM PHOSPHATE 10 MG/ML IJ SOLN
INTRAMUSCULAR | Status: DC | PRN
  Administered 2023-03-03: 4 mg via INTRAVENOUS

## 2023-03-03 MED ORDER — ONDANSETRON HCL 4 MG/2ML IJ SOLN
INTRAMUSCULAR | Status: DC | PRN
  Administered 2023-03-03: 4 mg via INTRAVENOUS

## 2023-03-03 MED ORDER — PHENYLEPHRINE HCL (PRESSORS) 10 MG/ML IV SOLN
INTRAVENOUS | Status: DC | PRN
  Administered 2023-03-03: 80 ug via INTRAVENOUS

## 2023-03-03 MED ORDER — MIDAZOLAM HCL 2 MG/2ML IJ SOLN
INTRAMUSCULAR | Status: AC
  Filled 2023-03-03: qty 2

## 2023-03-03 MED ORDER — ONDANSETRON HCL 4 MG/2ML IJ SOLN: 4.0000 mg | Freq: Once | INTRAMUSCULAR | Status: DC | PRN

## 2023-03-03 MED ORDER — FENTANYL CITRATE (PF) 100 MCG/2ML IJ SOLN
50.0000 ug | Freq: Once | INTRAMUSCULAR | Status: AC
  Administered 2023-03-03: 50 ug via INTRAVENOUS

## 2023-03-03 MED ORDER — FENTANYL CITRATE (PF) 100 MCG/2ML IJ SOLN: 25.0000 ug | INTRAMUSCULAR | Status: DC | PRN

## 2023-03-03 MED ORDER — MIDAZOLAM HCL 2 MG/2ML IJ SOLN
1.0000 mg | Freq: Once | INTRAMUSCULAR | Status: AC
  Administered 2023-03-03: 1 mg via INTRAVENOUS

## 2023-03-03 MED ORDER — OXYCODONE HCL 5 MG PO TABS: 5.0000 mg | ORAL_TABLET | Freq: Four times a day (QID) | ORAL | 0 refills | Status: AC | PRN

## 2023-03-03 MED ORDER — DEXMEDETOMIDINE HCL IN NACL 80 MCG/20ML IV SOLN
INTRAVENOUS | Status: DC | PRN
  Administered 2023-03-03: 4 ug via INTRAVENOUS

## 2023-03-03 MED ORDER — SENNA 8.6 MG PO TABS
2.0000 | ORAL_TABLET | Freq: Two times a day (BID) | ORAL | 0 refills | Status: DC
Start: 2023-03-03 — End: 2023-09-29

## 2023-03-03 MED ORDER — LIDOCAINE 2% (20 MG/ML) 5 ML SYRINGE
INTRAMUSCULAR | Status: AC
  Filled 2023-03-03: qty 5

## 2023-03-03 MED ORDER — ONDANSETRON HCL 4 MG/2ML IJ SOLN
INTRAMUSCULAR | Status: AC
  Filled 2023-03-03: qty 2

## 2023-03-03 MED ORDER — LACTATED RINGERS IV SOLN: INTRAVENOUS | Status: DC

## 2023-03-03 SURGICAL SUPPLY — 78 items
BANDAGE ESMARK 6X9 LF (GAUZE/BANDAGES/DRESSINGS) IMPLANT
BIT DRILL 2.7XCANN QCK CNCT (BIT) IMPLANT
BIT DRILL Q-C 2.0 DIA 100 (BIT) IMPLANT
BIT DRL 2.7XCANN QCK CNCT (BIT) ×1
BLADE AVERAGE 25X9 (BLADE) IMPLANT
BLADE LONG MED 25X9 (BLADE) IMPLANT
BLADE MICRO SAGITTAL (BLADE) IMPLANT
BLADE MINI RND TIP GREEN BEAV (BLADE) ×2 IMPLANT
BLADE OSC/SAG .038X5.5 CUT EDG (BLADE) IMPLANT
BLADE SURG 15 STRL LF DISP TIS (BLADE) ×4 IMPLANT
BNDG COHESIVE 4X5 WHT NS (GAUZE/BANDAGES/DRESSINGS) IMPLANT
BNDG ELASTIC 4INX 5YD STR LF (GAUZE/BANDAGES/DRESSINGS) ×2 IMPLANT
BNDG ELASTIC 6INX 5YD STR LF (GAUZE/BANDAGES/DRESSINGS) IMPLANT
BNDG ESMARK 6X9 LF (GAUZE/BANDAGES/DRESSINGS)
BNDG STRETCH GAUZE 3IN X12FT (GAUZE/BANDAGES/DRESSINGS) ×2 IMPLANT
BOOT STEPPER DURA LG (SOFTGOODS) IMPLANT
BOOT STEPPER DURA MED (SOFTGOODS) IMPLANT
BOOT STEPPER DURA SM (SOFTGOODS) IMPLANT
BOOT STEPPER DURA XLG (SOFTGOODS) IMPLANT
BUR MIS CONICAL WEDGE 4.3X13 (BURR) IMPLANT
BURR MIS CONICAL WEDGE 4.3X13 (BURR)
CHLORAPREP W/TINT 26 (MISCELLANEOUS) ×2 IMPLANT
COVER BACK TABLE 60X90IN (DRAPES) ×2 IMPLANT
CUFF TRNQT CYL 24X4X16.5-23 (TOURNIQUET CUFF) IMPLANT
CUFF TRNQT CYL 34X4.125X (TOURNIQUET CUFF) IMPLANT
DRAPE EXTREMITY T 121X128X90 (DISPOSABLE) ×2 IMPLANT
DRAPE OEC MINIVIEW 54X84 (DRAPES) ×2 IMPLANT
DRAPE U-SHAPE 47X51 STRL (DRAPES) ×2 IMPLANT
DRESSING MEPILEX FLEX 4X4 (GAUZE/BANDAGES/DRESSINGS) IMPLANT
DRSG MEPILEX FLEX 4X4 (GAUZE/BANDAGES/DRESSINGS)
DRSG MEPITEL 4X7.2 (GAUZE/BANDAGES/DRESSINGS) ×2 IMPLANT
ELECT REM PT RETURN 9FT ADLT (ELECTROSURGICAL) ×1
ELECTRODE REM PT RTRN 9FT ADLT (ELECTROSURGICAL) ×2 IMPLANT
GAUZE PAD ABD 8X10 STRL (GAUZE/BANDAGES/DRESSINGS) ×2 IMPLANT
GAUZE SPONGE 4X4 12PLY STRL (GAUZE/BANDAGES/DRESSINGS) ×2 IMPLANT
GAUZE STRETCH 2X75IN STRL (MISCELLANEOUS) IMPLANT
GLOVE BIO SURGEON STRL SZ8 (GLOVE) ×2 IMPLANT
GLOVE BIOGEL PI IND STRL 8 (GLOVE) ×4 IMPLANT
GLOVE ECLIPSE 8.0 STRL XLNG CF (GLOVE) ×2 IMPLANT
GOWN STRL REUS W/ TWL LRG LVL3 (GOWN DISPOSABLE) ×2 IMPLANT
GOWN STRL REUS W/ TWL XL LVL3 (GOWN DISPOSABLE) ×4 IMPLANT
GUIDEWIRE PIN ORTH 6X1.6XSMTH (WIRE) IMPLANT
K-WIRE DBL .054X9 NSTRL (WIRE) ×1
KWIRE DBL .054X9 NSTRL (WIRE) ×2 IMPLANT
NDL HYPO 22X1.5 SAFETY MO (MISCELLANEOUS) IMPLANT
NDL HYPO 25X1 1.5 SAFETY (NEEDLE) IMPLANT
NEEDLE HYPO 22X1.5 SAFETY MO (MISCELLANEOUS)
NEEDLE HYPO 25X1 1.5 SAFETY (NEEDLE)
NS IRRIG 1000ML POUR BTL (IV SOLUTION) ×2 IMPLANT
PACK BASIN DAY SURGERY FS (CUSTOM PROCEDURE TRAY) ×2 IMPLANT
PAD CAST 4YDX4 CTTN HI CHSV (CAST SUPPLIES) ×2 IMPLANT
PADDING CAST ABS COTTON 4X4 ST (CAST SUPPLIES) IMPLANT
PADDING CAST COTTON 6X4 STRL (CAST SUPPLIES) IMPLANT
PENCIL SMOKE EVACUATOR (MISCELLANEOUS) ×2 IMPLANT
PLATE TUB 39 W/COLLAR 5H (Plate) IMPLANT
SANITIZER HAND PURELL FF 515ML (MISCELLANEOUS) ×2 IMPLANT
SCREW CANN 4X30 (Screw) IMPLANT
SCREW CORTICAL 2.7X14MM (Screw) IMPLANT
SCREW CORTICAL 2.7X18MM (Screw) IMPLANT
SCREW NLOCK CORT 2.7X16 NS (Screw) IMPLANT
SHEET MEDIUM DRAPE 40X70 STRL (DRAPES) ×2 IMPLANT
SLEEVE SCD COMPRESS KNEE MED (STOCKING) ×2 IMPLANT
SPLINT PLASTER CAST FAST 5X30 (CAST SUPPLIES) IMPLANT
SPONGE SURGIFOAM ABS GEL 12-7 (HEMOSTASIS) IMPLANT
SPONGE T-LAP 18X18 ~~LOC~~+RFID (SPONGE) ×2 IMPLANT
STOCKINETTE 6 STRL (DRAPES) ×2 IMPLANT
SUCTION TUBE FRAZIER 10FR DISP (SUCTIONS) ×2 IMPLANT
SUT ETHILON 3 0 PS 1 (SUTURE) ×2 IMPLANT
SUT MNCRL AB 3-0 PS2 18 (SUTURE) ×2 IMPLANT
SUT VIC AB 2-0 SH 27XBRD (SUTURE) ×2 IMPLANT
SUT VICRYL 0 SH 27 (SUTURE) IMPLANT
SUT VICRYL 0 UR6 27IN ABS (SUTURE) IMPLANT
SYR BULB EAR ULCER 3OZ GRN STR (SYRINGE) ×2 IMPLANT
SYR CONTROL 10ML LL (SYRINGE) IMPLANT
TOWEL GREEN STERILE FF (TOWEL DISPOSABLE) ×4 IMPLANT
TUBE CONNECTING 20X1/4 (TUBING) ×2 IMPLANT
UNDERPAD 30X36 HEAVY ABSORB (UNDERPADS AND DIAPERS) ×2 IMPLANT
YANKAUER SUCT BULB TIP NO VENT (SUCTIONS) IMPLANT

## 2023-03-03 NOTE — Transfer of Care (Signed)
 Immediate Anesthesia Transfer of Care Note  Patient: Tammy [name]  Procedure(s) Performed: ARTHRODESIS METATARSALPHALANGEAL JOINT (MTPJ) (Left: Toe) left silver bunionectomy (Left: Toe)  Patient Location: PACU  Anesthesia Type:GA combined with regional for post-op pain  Level of Consciousness: awake, alert , and oriented  Airway & Oxygen Therapy: Patient Spontanous Breathing and Patient connected to nasal cannula oxygen  Post-op Assessment: Report given to RN and Post -op Vital signs reviewed and stable  Post vital signs: Reviewed and stable  Last Vitals:  Vitals Value Taken Time  BP 112/58 03/03/23 0836  Temp    Pulse 62 03/03/23 0839  Resp 14 03/03/23 0839  SpO2 99 % 03/03/23 0839  Vitals shown include unfiled device data.  Last Pain:  Vitals:   03/03/23 9367  TempSrc: Temporal  PainSc: 0-No pain      Patients Stated Pain Goal: 3 (03/03/23 9367)  Complications: No notable events documented.

## 2023-03-03 NOTE — Progress Notes (Signed)
Assisted Dr. Houser with left, adductor canal, popliteal, ultrasound guided block. Side rails up, monitors on throughout procedure. See vital signs in flow sheet. Tolerated Procedure well. 

## 2023-03-03 NOTE — Anesthesia Procedure Notes (Signed)
 Procedure Name: LMA Insertion Date/Time: 03/03/2023 7:44 AM  Performed by: Julieanne Fairy BROCKS, CRNAPre-anesthesia Checklist: Patient identified, Emergency Drugs available, Suction available and Patient being monitored Patient Re-evaluated:Patient Re-evaluated prior to induction Oxygen Delivery Method: Circle system utilized Preoxygenation: Pre-oxygenation with 100% oxygen Induction Type: IV induction Ventilation: Mask ventilation without difficulty LMA: LMA inserted LMA Size: 4.0 Number of attempts: 1 Airway Equipment and Method: Bite block Placement Confirmation: positive ETCO2 Tube secured with: Tape Dental Injury: Teeth and Oropharynx as per pre-operative assessment

## 2023-03-03 NOTE — Anesthesia Postprocedure Evaluation (Signed)
 Anesthesia Post Note  Patient: Kobie Chilton Hartfield  Procedure(s) Performed: ARTHRODESIS METATARSALPHALANGEAL JOINT (MTPJ) (Left: Toe) left silver bunionectomy (Left: Toe)     Patient location during evaluation: PACU Anesthesia Type: Regional and General Level of consciousness: awake and alert Pain management: pain level controlled Vital Signs Assessment: post-procedure vital signs reviewed and stable Respiratory status: spontaneous breathing, nonlabored ventilation, respiratory function stable and patient connected to nasal cannula oxygen Cardiovascular status: blood pressure returned to baseline and stable Postop Assessment: no apparent nausea or vomiting Anesthetic complications: no   No notable events documented.  Last Vitals:  Vitals:   03/03/23 0915 03/03/23 0945  BP: (!) 103/55 (!) 108/51  Pulse: 63 63  Resp: 14 16  Temp:  36.7 C  SpO2: 98% 98%    Last Pain:  Vitals:   03/03/23 0945  TempSrc:   PainSc: 0-No pain                 Garnette DELENA Gab

## 2023-03-03 NOTE — Anesthesia Procedure Notes (Signed)
 Anesthesia Regional Block: Adductor canal block   Pre-Anesthetic Checklist: , timeout performed,  Correct Patient, Correct Site, Correct Laterality,  Correct Procedure, Correct Position, site marked,  Risks and benefits discussed,  Surgical consent,  Pre-op evaluation,  At surgeon's request and post-op pain management  Laterality: Lower and Left  Prep: chloraprep       Needles:  Injection technique: Single-shot  Needle Type: Echogenic Needle     Needle Length: 9cm  Needle Gauge: 22     Additional Needles:   Procedures:,,,, ultrasound used (permanent image in chart),,    Narrative:  Start time: 03/03/2023 7:07 AM End time: 03/03/2023 7:11 AM Injection made incrementally with aspirations every 5 mL.  Performed by: Personally  Anesthesiologist: Jefm Garnette LABOR, MD  Additional Notes: Block assessed prior to surgery. Pt tolerated procedure well.

## 2023-03-03 NOTE — Anesthesia Procedure Notes (Signed)
 Anesthesia Regional Block: Popliteal block   Pre-Anesthetic Checklist: , timeout performed,  Correct Patient, Correct Site, Correct Laterality,  Correct Procedure, Correct Position, site marked,  Risks and benefits discussed,  Pre-op evaluation,  At surgeon's request and post-op pain management  Laterality: Lower and Left  Prep: Maximum Sterile Barrier Precautions used, chloraprep       Needles:  Injection technique: Single-shot  Needle Type: Echogenic Needle     Needle Length: 9cm  Needle Gauge: 21     Additional Needles:   Procedures:,,,, ultrasound used (permanent image in chart),,    Narrative:  Start time: 03/03/2023 7:01 AM End time: 03/03/2023 7:06 AM Injection made incrementally with aspirations every 5 mL.  Performed by: Personally  Anesthesiologist: Jefm Garnette LABOR, MD  Additional Notes: Block assessed. Patient tolerated procedure well.

## 2023-03-03 NOTE — Discharge Instructions (Addendum)
 Toni Arthurs, MD [facility]  Please read the following information regarding your care after surgery.  Medications  You only need a prescription for the narcotic pain medicine (ex. oxycodone, Percocet, Norco).  All of the other medicines listed below are available over the counter. ? Aleve 2 pills twice a day for the first 3 days after surgery. ? acetominophen (Tylenol) 650 mg every 4-6 hours as you need for minor to moderate pain ? oxycodone as prescribed for severe pain  Narcotic pain medicine (ex. oxycodone, Percocet, Vicodin) will cause constipation.  To prevent this problem, take the following medicines while you are taking any pain medicine. ? docusate sodium (Colace) 100 mg twice a day ? senna (Senokot) 2 tablets twice a day  Weight Bearing ? Bear weight only on your operated foot in the CAM boot.   Cast / Splint / Dressing ? Keep your splint, cast or dressing clean and dry.  Don't put anything (coat hanger, pencil, etc) down inside of it.  If it gets damp, use a hair dryer on the cool setting to dry it.  If it gets soaked, call the office to schedule an appointment for a cast change.   After your dressing, cast or splint is removed; you may shower, but do not soak or scrub the wound.  Allow the water to run over it, and then gently pat it dry.  Swelling It is normal for you to have swelling where you had surgery.  To reduce swelling and pain, keep your toes above your nose for at least 3 days after surgery.  It may be necessary to keep your foot or leg elevated for several weeks.  If it hurts, it should be elevated.  Follow Up Call my office at [phone] when you are discharged from the hospital or surgery center to schedule an appointment to be seen two weeks after surgery.  Call my office at [phone] if you develop a fever >101.5 F, nausea, vomiting, bleeding from the surgical site or severe pain.      Post Anesthesia Home Care Instructions  Activity: Get plenty  of rest for the remainder of the day. A responsible individual must stay with you for 24 hours following the procedure.  For the next 24 hours, DO NOT: -Drive a car -Advertising copywriter -Drink alcoholic beverages -Take any medication unless instructed by your physician -Make any legal decisions or sign important papers.  Meals: Start with liquid foods such as gelatin or soup. Progress to regular foods as tolerated. Avoid greasy, spicy, heavy foods. If nausea and/or vomiting occur, drink only clear liquids until the nausea and/or vomiting subsides. Call your physician if vomiting continues.  Special Instructions/Symptoms: Your throat may feel dry or sore from the anesthesia or the breathing tube placed in your throat during surgery. If this causes discomfort, gargle with warm salt water. The discomfort should disappear within 24 hours.  If you had a scopolamine patch placed behind your ear for the management of post- operative nausea and/or vomiting:  1. The medication in the patch is effective for 72 hours, after which it should be removed.  Wrap patch in a tissue and discard in the trash. Wash hands thoroughly with soap and water. 2. You may remove the patch earlier than 72 hours if you experience unpleasant side effects which may include dry mouth, dizziness or visual disturbances. 3. Avoid touching the patch. Wash your hands with soap and water after contact with the patch.   Regional Anesthesia Blocks  1.  You may not be able to move or feel the "blocked" extremity after a regional anesthetic block. This may last may last from 3-48 hours after placement, but it will go away. The length of time depends on the medication injected and your individual response to the medication. As the nerves start to wake up, you may experience tingling as the movement and feeling returns to your extremity. If the numbness and inability to move your extremity has not gone away after 48 hours, please call your  surgeon.   2. The extremity that is blocked will need to be protected until the numbness is gone and the strength has returned. Because you cannot feel it, you will need to take extra care to avoid injury. Because it may be weak, you may have difficulty moving it or using it. You may not know what position it is in without looking at it while the block is in effect.  3. For blocks in the legs and feet, returning to weight bearing and walking needs to be done carefully. You will need to wait until the numbness is entirely gone and the strength has returned. You should be able to move your leg and foot normally before you try and bear weight or walk. You will need someone to be with you when you first try to ensure you do not fall and possibly risk injury.  4. Bruising and tenderness at the needle site are common side effects and will resolve in a few days.  5. Persistent numbness or new problems with movement should be communicated to the surgeon or the [facility] [phone] [facility] [phone]).

## 2023-03-03 NOTE — Op Note (Signed)
 03/03/2023  8:32 AM  PATIENT:  Tammy [name]  73 y.o. female  PRE-OPERATIVE DIAGNOSIS:   1.  Left hallux rigidus      2.  Left foot bunion  POST-OPERATIVE DIAGNOSIS:   same  Procedure(s): 1.  Left foot Silver bunionectomy   2.  Left hallux MPJ arthrodesis   3.  Left foot AP and lateral xrays  SURGEON:  Norleen Armor, MD  ASSISTANT: Eva Barrack, PA-C  ANESTHESIA:   General, regional  EBL:  minimal   TOURNIQUET:   Total Tourniquet Time Documented: Thigh (Left) - 29 minutes Total: Thigh (Left) - 29 minutes  COMPLICATIONS:  None apparent  DISPOSITION:  Extubated, awake and stable to recovery.  INDICATION FOR PROCEDURE: 73 year old female without significant past medical history presents today for surgical treatment of her painful left foot bunion deformity.  She has failed nonoperative treatment to date including activity modification, oral anti-inflammatories and shoewear modification.  She presents now for surgical treatment.  The risks and benefits of the alternative treatment options have been discussed in detail.  The patient wishes to proceed with surgery and specifically understands risks of bleeding, infection, nerve damage, blood clots, need for additional surgery, amputation and death.   PROCEDURE IN DETAIL:  After pre operative consent was obtained, and the correct operative site was identified, the patient was brought to the operating room and placed supine on the OR table.  Anesthesia was administered.  Pre-operative antibiotics were administered.  A surgical timeout was taken.  The left lower extremity was prepped and draped in standard sterile fashion with a tourniquet around the thigh.  The extremity was elevated, and the tourniquet was inflated to 250 mmHg.  A longitudinal incision was made over the hallux MP joint.  Dissection was carried sharply down through the subcutaneous tissues.  The dorsal joint capsule was incised and elevated medially and laterally.  The  hypertrophic medial eminence was identified.  A silver bunionectomy was performed using a rondure to remove all of the hypertrophic bone at the medial aspect of the first metatarsal head.  Peripheral osteophytes were removed laterally as well as at the base of the proximal phalanx.  The collateral ligaments were released exposing the head completely.  A concave reamer was used to remove the remaining articular cartilage and subchondral bone.  The convex reamer was used at the base of the proximal phalanx to remove the remaining articular cartilage and subchondral bone.  The wound was irrigated.  Both sides of the joint were perforated with a small drill bit leaving the resultant bone graft in place.  The joint was reduced and provisionally pinned.  AP and lateral radiographs confirmed appropriate reduction of the joint.  A simulated weightbearing examination showed appropriate dorsiflexion.  The joint was then compressed with a cannulated 4 mm partially-threaded stainless steel [employer] screw.  A 5 hole one quarter tubular plate from the [employer] mini frag set was selected.  It was contoured to fit the dorsum of the joint.  It was secured proximally and distally with bicortical screws.  AP and lateral radiographs confirmed appropriate reduction of the joint in appropriate position and length of all hardware.  The wound was irrigated copiously and sprinkled with vancomycin  powder.  The EHB tendon and periosteum were repaired over the plate.  Subcutaneous tissues were approximated with Vicryl.  The skin incision was closed with 3-0 nylon.  Sterile dressings were applied followed by a tall cam boot.  The tourniquet was released after application  of the dressings.  The patient was awakened from anesthesia and transported to the recovery room in stable condition.   FOLLOW UP PLAN: Weightbearing as tolerated in a cam boot for the next 6 weeks.  Follow-up in 2 weeks for suture removal.  No indication for DVT  prophylaxis in this ambulatory patient.   RADIOGRAPHS: AP and lateral radiographs of the left foot are obtained intraoperatively.  These show interval arthrodesis of the hallux MP joint and removal of the bunion deformity.  Hardware is appropriately positioned and of the appropriate lengths.  No other acute injuries are noted.    Justin Ollis PA-C was present and scrubbed for the duration of the operative case. His assistance was essential in positioning the patient, prepping and draping, gaining and maintaining exposure, performing the operation, closing and dressing the wounds and applying the splint.

## 2023-03-04 ENCOUNTER — Encounter (HOSPITAL_BASED_OUTPATIENT_CLINIC_OR_DEPARTMENT_OTHER): Payer: Self-pay | Admitting: Orthopedic Surgery

## 2023-03-17 ENCOUNTER — Other Ambulatory Visit: Payer: Self-pay | Admitting: Cardiology

## 2023-03-17 DIAGNOSIS — I480 Paroxysmal atrial fibrillation: Secondary | ICD-10-CM

## 2023-03-17 NOTE — Telephone Encounter (Signed)
Prescription refill request for Xarelto received.  Indication: Afib  Last office visit: 02/11/23 Anne Fu)  Weight: 54.2kg Age: 73 Scr: 0.80 (06/29/22)  CrCl: 53.11ml/min  Appropriate dose. Refill sent.

## 2023-04-25 DIAGNOSIS — M2022 Hallux rigidus, left foot: Secondary | ICD-10-CM | POA: Diagnosis not present

## 2023-04-25 DIAGNOSIS — Z4789 Encounter for other orthopedic aftercare: Secondary | ICD-10-CM | POA: Diagnosis not present

## 2023-05-16 ENCOUNTER — Other Ambulatory Visit: Payer: Self-pay | Admitting: Family Medicine

## 2023-05-16 DIAGNOSIS — Z0000 Encounter for general adult medical examination without abnormal findings: Secondary | ICD-10-CM

## 2023-05-17 ENCOUNTER — Ambulatory Visit
Admission: RE | Admit: 2023-05-17 | Discharge: 2023-05-17 | Disposition: A | Discharge status: Home / Self Care | Source: Ambulatory Visit | Attending: Family Medicine | Admitting: Family Medicine

## 2023-05-17 DIAGNOSIS — Z1231 Encounter for screening mammogram for malignant neoplasm of breast: Secondary | ICD-10-CM | POA: Diagnosis not present

## 2023-05-17 DIAGNOSIS — Z0000 Encounter for general adult medical examination without abnormal findings: Secondary | ICD-10-CM

## 2023-05-27 DIAGNOSIS — Z4789 Encounter for other orthopedic aftercare: Secondary | ICD-10-CM | POA: Diagnosis not present

## 2023-05-27 DIAGNOSIS — M2022 Hallux rigidus, left foot: Secondary | ICD-10-CM | POA: Diagnosis not present

## 2023-07-11 ENCOUNTER — Ambulatory Visit: Payer: Self-pay

## 2023-07-11 NOTE — Telephone Encounter (Signed)
 Chief Complaint: ear pain Symptoms: ear, throat pain Frequency: began over the weekend Pertinent Negatives: Patient denies fever, dizziness, neck stiffness, blurred vision, unsteady gait, one-sided weakness, flu-like symptoms, hearing loss, H/A, CP, SOB Disposition: [] ED /[] Urgent Care (no appt availability in office) / [x] Appointment(In office/virtual)/ []  [facility]/ [] Home Care/ [] Refused Recommended Disposition /[] [facility]/ []  Follow-up with PCP Additional Notes: Pt reports sharp pain in her R ear and down her throat while turning her head a certain way, eating, or swallowing. Pain comes and goes but she rates it a 7/10 when present. Pt denies recent air travel, swimming, or inserting objects into her hair. Denies dizziness, blurry vision, hearing loss, neck stiffness, one-sided weakness, unsteady gait, vomiting. Had a slight headache yesterday but none today. Pt also reports two pimples to her chin, states this is unusual for her. RN scheduled pt for tomorrow AM at 0945. RN advised the pt if she worsens to go to the ED. Pt verbalized understanding.     Copied from CRM [id number]. Topic: Clinical - Red Word Triage >> Jul 11, 2023  2:30 PM Howard Macho wrote: Red Word that prompted transfer to Nurse Triage: sharp pain in her right ear and when she moves her mouth a certain way and when she swallows. Patient stated it is not constant pain Reason for Disposition  Earache  (Exceptions: brief ear pain of < 60 minutes duration, earache occurring during air travel  Answer Assessment - Initial Assessment Questions 1. LOCATION: "Which ear is involved?"     R ear 2. ONSET: "When did the ear start hurting"      Started over the weekend 3. SEVERITY: "How bad is the pain?"  (Scale 1-10; mild, moderate or severe)   - MILD (1-3): doesn't interfere with normal activities    - MODERATE (4-7): interferes with normal activities or awakens from sleep    - SEVERE (8-10): excruciating  pain, unable to do any normal activities      "Sometimes worse than others", 7/10; intermittent  4. URI SYMPTOMS: "Do you have a runny nose or cough?"     No  5. FEVER: "Do you have a fever?" If Yes, ask: "What is your temperature, how was it measured, and when did it start?"     No  6. CAUSE: "Have you been swimming recently?", "How often do you use Q-TIPS?", "Have you had any recent air travel or scuba diving?"     No  7. OTHER SYMPTOMS: "Do you have any other symptoms?" (e.g., headache, stiff neck, dizziness, vomiting, runny nose, decreased hearing)     R ear sharp pain when she turns her head, swallows, eats. Throat hurts every now and then. Big pimple on her chin. Second pimple popped up next to it. Denies H/A at this time, states she had a slight H/A yesterday. Denies dizziness. Denies vomiting. Denies hearing loss.  Protocols used: Louie Rover

## 2023-07-12 ENCOUNTER — Ambulatory Visit (INDEPENDENT_AMBULATORY_CARE_PROVIDER_SITE_OTHER): Admitting: Family Medicine

## 2023-07-12 VITALS — BP 118/64 | HR 82 | Temp 97.8°F | Wt 117.6 lb

## 2023-07-12 DIAGNOSIS — R238 Other skin changes: Secondary | ICD-10-CM

## 2023-07-12 MED ORDER — PREDNISONE 10 MG PO TABS: ORAL_TABLET | ORAL | 0 refills | Status: DC

## 2023-07-12 MED ORDER — VALACYCLOVIR HCL 1 G PO TABS: 1000.0000 mg | ORAL_TABLET | Freq: Three times a day (TID) | ORAL | 0 refills | Status: DC

## 2023-07-12 NOTE — Progress Notes (Signed)
 Established Patient Office Visit  Subjective   Patient ID: Tammy [name], female    DOB: 27-Dec-1950  Age: 73 y.o. MRN: [id number]  Chief Complaint  Patient presents with   Sore Throat    Patient complains of sore throat, x3 days, Tried Tylenol     Ear Pain    Patient complains of right ear pain, x3 days, Tried Tylenol      HPI   Arena is seen today with onset a few days ago of some slightly right-sided throat pain, intermittent sharp pain around the right ear, and facial rash mostly right chin area.  Denies any pustules.  No recent body aches.  No cough or nasal congestion.  Facial rash has been somewhat vesicular.  No real pain.  She describes occasional "tingling ".  More pruritic than anything.  No other areas of rash on her trunk or extremities.  No history of shingles vaccine.  No recent fevers or chills.  She has no history of cold sores or herpes but her husband has had history of cold sores though none recently.  Past Medical History:  Diagnosis Date   Dysrhythmia    a fib s/p ablation 07/2021   Hyperlipidemia    PONV (postoperative nausea and vomiting)    Past Surgical History:  Procedure Laterality Date   ARTHRODESIS METATARSALPHALANGEAL JOINT (MTPJ) Left 03/03/2023   Procedure: ARTHRODESIS METATARSALPHALANGEAL JOINT (MTPJ);  Surgeon: Amada Backer, MD;  Location: [facility];  Service: Orthopedics;  Laterality: Left;  general, regional (add canal / pop / no exparel) 90 min   ATRIAL FIBRILLATION ABLATION N/A 07/28/2021   Procedure: ATRIAL FIBRILLATION ABLATION;  Surgeon: Boyce Byes, MD;  [facility];  Service: Cardiovascular;  Laterality: N/A;   BUNIONECTOMY Left 03/03/2023   Procedure: left silver bunionectomy;  Surgeon: Amada Backer, MD;  Location: [facility];  Service: Orthopedics;  Laterality: Left;   DILATION AND CURETTAGE OF UTERUS N/A     reports that she quit smoking about 50 years ago. Her smoking use  included cigarettes. She started smoking about 55 years ago. She has a 5 pack-year smoking history. She has never used smokeless tobacco. She reports that she does not currently use alcohol after a past usage of about 1.0 standard drink of alcohol per week. She reports that she does not use drugs. family history includes Cancer in her father, mother, and sister; Heart disease in her father; Heart disease (age of onset: 43) in her mother; Stroke in her father and sister. Allergies  Allergen Reactions   Novocain [Procaine] Palpitations   Penicillins Rash   Codeine Other (See Comments)    Pt. States "crazy dreams"   Contrast Media [Iodinated Contrast Media] Itching    Eye Swelling    Review of Systems  Constitutional:  Negative for chills and fever.  HENT:  Positive for ear pain and sore throat. Negative for congestion.   Respiratory:  Negative for cough.   Cardiovascular:  Negative for chest pain.  Skin:  Positive for itching and rash.      Objective:     BP 118/64 (BP Location: Left Arm, Patient Position: Sitting, Cuff Size: Normal)   Pulse 82   Temp 97.8 F (36.6 C) (Oral)   Wt 117 lb 9.6 oz (53.3 kg)   LMP  (LMP Unknown)   SpO2 96%   BMI 19.27 kg/m    Physical Exam Vitals reviewed.  Constitutional:      General: She is not  in acute distress.    Appearance: She is not ill-appearing.  HENT:     Right Ear: Tympanic membrane normal.     Left Ear: Tympanic membrane normal.     Mouth/Throat:     Mouth: Mucous membranes are moist. No oral lesions.     Pharynx: Oropharynx is clear. No oropharyngeal exudate or posterior oropharyngeal erythema.  Cardiovascular:     Rate and Rhythm: Normal rate and regular rhythm.  Musculoskeletal:     Cervical back: Neck supple.  Skin:    Findings: Rash present.     Comments: She has a couple of small vesicles right chin area and a couple of areas that are slightly crusted over.  No pustules.  No cellulitis changes.  Neurological:      Mental Status: She is alert.      No results found for any visits on 07/12/23.    The 10-year ASCVD risk score (Arnett DK, et al., 2019) is: 11.3%    Assessment & Plan:   Problem List Items Addressed This Visit   None Visit Diagnoses       Vesicular rash    -  Primary   Relevant Medications   valACYclovir (VALTREX) 1000 MG tablet   predniSONE  (DELTASONE ) 10 MG tablet   Other Relevant Orders   Culture, Virus     Seen with approximately 3-day history of rash which is mostly pruritic on her face.  Does have a couple small vesicles.  Surprisingly, no associated pain.  We discussed differential including shingles, herpetic, less likely contact dermatitis.  Rash is nonpustular and does not look bacterial.  -We were able to culture one of the vesicles. - Start Valtrex 1 g 3 times daily for 7 days - Prescription for prednisone  to start if she has ongoing pruritus and ongoing spread of rash - She is cautioned to be careful around her grandchildren and pregnant daughter  No follow-ups on file.    Glean Lamy, MD

## 2023-07-14 ENCOUNTER — Ambulatory Visit: Payer: Self-pay | Admitting: Family Medicine

## 2023-07-25 LAB — VIRUS CULTURE

## 2023-07-25 LAB — SPECIMEN STATUS REPORT

## 2023-08-04 ENCOUNTER — Other Ambulatory Visit: Payer: Self-pay | Admitting: Cardiology

## 2023-08-05 ENCOUNTER — Other Ambulatory Visit: Payer: Self-pay | Admitting: Cardiology

## 2023-08-17 DIAGNOSIS — M2021 Hallux rigidus, right foot: Secondary | ICD-10-CM | POA: Diagnosis not present

## 2023-08-17 DIAGNOSIS — M21611 Bunion of right foot: Secondary | ICD-10-CM | POA: Diagnosis not present

## 2023-08-17 DIAGNOSIS — M2022 Hallux rigidus, left foot: Secondary | ICD-10-CM | POA: Diagnosis not present

## 2023-08-18 ENCOUNTER — Telehealth: Payer: Self-pay

## 2023-08-18 DIAGNOSIS — Z01818 Encounter for other preprocedural examination: Secondary | ICD-10-CM

## 2023-08-18 NOTE — Telephone Encounter (Signed)
   Pre-operative Risk Assessment    Patient Name: Tammy [name]  DOB: [birth year] MRN: [id number]   Date of last office visit: 02/11/23 ONEIL PARCHMENT, MD Date of next office visit: NONE   Request for Surgical Clearance    Procedure:  RIGHT HALLUX MPJ ARTHRODESIS; SILVER BUNIONECTOMY  Date of Surgery:  Clearance TBD                                Surgeon:  DR NORLEEN ARMOR Surgeon's Group or Practice Name:  [facility] Phone number:  [phone] Fax number:  [phone]   Type of Clearance Requested:   - Medical  - Pharmacy:  Hold Rivaroxaban  (Xarelto )     Type of Anesthesia:  General    Additional requests/questions:    Signed, Lucie DELENA Ku   08/18/2023, 1:10 PM

## 2023-08-19 ENCOUNTER — Other Ambulatory Visit: Payer: Self-pay | Admitting: Orthopedic Surgery

## 2023-08-19 NOTE — Telephone Encounter (Signed)
 Pharmacy please advise on holding Rivaroxaban  prior to Right Hallux MPJ Arthrodesis Silver Bunionectomy scheduled for TBD. Thank you.

## 2023-08-22 ENCOUNTER — Telehealth: Payer: Self-pay | Admitting: *Deleted

## 2023-08-22 NOTE — Telephone Encounter (Signed)
 08/22/23  5:30 PM Note Pt agreeable to labs CBC/BMET for preop clearance. Pt will get labs between 7/9-12/2023. Pt scheduled tele preop appt 09/19/23. Med rec and consent are done.    Lab orders have been placed and released to [facility].

## 2023-08-22 NOTE — Telephone Encounter (Addendum)
 Preop Team, patient needs updated CBC and BMET prior to guidance on holding Xarelto .  Please place orders and advised patient to follow-up with labs.  Jackee Alberts, NP

## 2023-08-22 NOTE — Addendum Note (Signed)
 Addended by: Inas Avena M on: 08/22/2023 05:32 PM   Modules accepted: Orders

## 2023-08-22 NOTE — Telephone Encounter (Signed)
 Pt agreeable to labs CBC/BMET for preop clearance. Pt will get labs between 7/9-12/2023. Pt scheduled tele preop appt 09/19/23. Med rec and consent are done.   Lab orders have been placed and released to [facility].

## 2023-08-22 NOTE — Telephone Encounter (Signed)
 Patient with diagnosis of atrial fibrillation on Xarelto  for anticoagulation.    Procedure:  RIGHT HALLUX MPJ ARTHRODESIS; SILVER BUNIONECTOMY   Date of Surgery:  Clearance TBD          CHA2DS2-VASc Score = 3   This indicates a 3.2% annual risk of stroke. The patient's score is based upon: CHF History: 0 HTN History: 0 Diabetes History: 0 Stroke History: 0 Vascular Disease History: 1 Age Score: 1 Gender Score: 1    Need updated BMET, CBD for determination of hold  Patient has not had an Afib/aflutter ablation within the last 3 months or DCCV within the last 30 days

## 2023-09-06 ENCOUNTER — Ambulatory Visit: Admitting: Family Medicine

## 2023-09-13 DIAGNOSIS — Z01818 Encounter for other preprocedural examination: Secondary | ICD-10-CM | POA: Diagnosis not present

## 2023-09-14 LAB — CBC
Hematocrit: 42.4 % (ref 34.0–46.6)
Hemoglobin: 14 g/dL (ref 11.1–15.9)
MCH: 32.6 pg (ref 26.6–33.0)
MCHC: 33 g/dL (ref 31.5–35.7)
MCV: 99 fL — ABNORMAL HIGH (ref 79–97)
Platelets: 275 x10E3/uL (ref 150–450)
RBC: 4.3 x10E6/uL (ref 3.77–5.28)
RDW: 13 % (ref 11.7–15.4)
WBC: 7.6 x10E3/uL (ref 3.4–10.8)

## 2023-09-14 LAB — BASIC METABOLIC PANEL WITH GFR
BUN/Creatinine Ratio: 23 (ref 12–28)
BUN: 18 mg/dL (ref 8–27)
CO2: 23 mmol/L (ref 20–29)
Calcium: 9.1 mg/dL (ref 8.7–10.3)
Chloride: 102 mmol/L (ref 96–106)
Creatinine, Ser: 0.78 mg/dL (ref 0.57–1.00)
Glucose: 119 mg/dL — ABNORMAL HIGH (ref 70–99)
Potassium: 4 mmol/L (ref 3.5–5.2)
Sodium: 142 mmol/L (ref 134–144)
eGFR: 80 mL/min/1.73 (ref >59)

## 2023-09-15 NOTE — Telephone Encounter (Signed)
   Name: Tammy [name]  DOB: 07/19/1950  MRN: [id number]  Primary Cardiologist: Oneil Parchment, MD   Preoperative team, please contact this patient and set up a phone call appointment for further preoperative risk assessment. Please obtain consent and complete medication review. Thank you for your help.  I confirm that guidance regarding antiplatelet and oral anticoagulation therapy has been completed and, if necessary, noted below.  Per office protocol, patient can hold Xarelto  for 2 days prior to procedure.   Patient will not need bridging with Lovenox (enoxaparin) around procedure.  I also confirmed the patient resides in the state of [place] . As per [facility] Medical Board telemedicine laws, the patient must reside in the state in which the provider is licensed.   Lum LITTIE Louis, NP 09/15/2023, 11:49 AM [facility]

## 2023-09-15 NOTE — Telephone Encounter (Signed)
 Pt is scheduled for a TELE Preop appt 09/19/23.

## 2023-09-15 NOTE — Telephone Encounter (Signed)
  CrCl 54 Platelet count 275  Patient has not had an Afib/aflutter ablation within the last 3 months or DCCV within the last 30 days  Per office protocol, patient can hold Xarelto  for 2 days prior to procedure.   Patient will not need bridging with Lovenox (enoxaparin) around procedure.  **This guidance is not considered finalized until pre-operative APP has relayed final recommendations.**

## 2023-09-19 ENCOUNTER — Ambulatory Visit: Attending: Internal Medicine | Admitting: Student

## 2023-09-19 DIAGNOSIS — Z01810 Encounter for preprocedural cardiovascular examination: Secondary | ICD-10-CM

## 2023-09-19 NOTE — Progress Notes (Signed)
 Virtual Visit via Telephone Note   Because of Tammy [name]'s co-morbid illnesses, she is at least at moderate risk for complications without adequate follow up.  This format is felt to be most appropriate for this patient at this time.  The patient did not have access to video technology/had technical difficulties with video requiring transitioning to audio format only (telephone).  All issues noted in this document were discussed and addressed.  No physical exam could be performed with this format.  Please refer to the patient's chart for her consent to telehealth for [facility].  Evaluation Performed:  Preoperative cardiovascular risk assessment _____________   Date:  09/19/2023   Patient ID:  Tammy, [name] 1950/09/16, MRN [id number] Patient Location:  Home Provider location:   Office  Primary Care Provider:  Micheal Wolm ORN, MD [facility] Providers Cardiologist:  Oneil Parchment, MD Electrophysiologist:  OLE ONEIDA HOLTS, MD   Chief Complaint / Patient Profile   73 y.o. y/o female with a h/o PAF s/p ablation June 2023 on anticoagulation, carotid artery stenosis, hyperlipidemia, migraine headaches who is pending right helix MPJ arthrodesis/silver bunionectomy by Dr. Kit and presents today for telephonic preoperative cardiovascular risk assessment.  History of Present Illness    Tammy [name] is a 73 y.o. female who presents via audio/video conferencing for a telehealth visit today.  Pt was last seen in cardiology clinic on 02/11/2023 by Dr. Parchment.  At that time Tammy [name] was stable from a cardiac standpoint.  The patient is now pending procedure as outlined above. Since her last visit, she is doing well. Patient denies shortness of breath, dyspnea on exertion, lower extremity edema, orthopnea or PND. No chest pain, pressure, or tightness. She has occasional palpitations that are unchanged for years. Her activity has been  somewhat limited secondary to the high temperatures. She is normally very active walking for exercise and playing tennis and pickleball.   Past Medical History    Past Medical History:  Diagnosis Date   Dysrhythmia    a fib s/p ablation 07/2021   Hyperlipidemia    PONV (postoperative nausea and vomiting)    Past Surgical History:  Procedure Laterality Date   ARTHRODESIS METATARSALPHALANGEAL JOINT (MTPJ) Left 03/03/2023   Procedure: ARTHRODESIS METATARSALPHALANGEAL JOINT (MTPJ);  Surgeon: Kit Rush, MD;  Location: [facility];  Service: Orthopedics;  Laterality: Left;  general, regional (add canal / pop / no exparel) 90 min   ATRIAL FIBRILLATION ABLATION N/A 07/28/2021   Procedure: ATRIAL FIBRILLATION ABLATION;  Surgeon: HOLTS OLE ONEIDA, MD;  [facility];  Service: Cardiovascular;  Laterality: N/A;   BUNIONECTOMY Left 03/03/2023   Procedure: left silver bunionectomy;  Surgeon: Kit Rush, MD;  Location:  SUR[facility]ERY [facility];  Service: Orthopedics;  Laterality: Left;   DILATION AND CURETTAGE OF UTERUS N/A     Allergies  Allergies  Allergen Reactions   Novocain [Procaine] Palpitations   Penicillins Rash   Codeine Other (See Comments)    Pt. States crazy dreams   Contrast Media [Iodinated Contrast Media] Itching    Eye Swelling    Home Medications    Prior to Admission medications   Medication Sig Start Date End Date Taking? Authorizing Provider  docusate sodium  (COLACE) 100 MG capsule Take 1 capsule (100 mg total) by mouth 2 (two) times daily. While taking narcotic pain medicine. 03/03/23   Aniceto Eva Grebe, PA-C  metoprolol  succinate (TOPROL  XL) 25 MG 24 hr tablet Take  0.5 tablets (12.5 mg total) by mouth at bedtime. 12/23/22   Cindie Ole DASEN, MD  metoprolol  tartrate (LOPRESSOR ) 25 MG tablet TAKE 1 TABLET BY MOUTH ONCE DAILY AS NEEDED FOR BREAK THROUGH AFIB/TACHYCARDIA 08/05/23   Jeffrie Oneil BROCKS, MD  OVER THE COUNTER MEDICATION Take 2  capsules by mouth 2 (two) times daily. Algae cal +    [provider]  OVER THE COUNTER MEDICATION Take 2 capsules by mouth at bedtime. Stronsium Boost    [provider]  predniSONE  (DELTASONE ) 10 MG tablet Taper as follows:  4-4-4-3-3-3-2-2-2-1-1 07/12/23   Burchette, Wolm ORN, MD  PREVIDENT 5000 BOOSTER PLUS 1.1 % PSTE Place 1 application. onto teeth See admin instructions. 1 daily full mouth and after that brush one tooth daily 10/15/20   [provider]  rivaroxaban  (XARELTO ) 20 MG TABS tablet TAKE 1 TABLET(20 MG) BY MOUTH DAILY WITH SUPPER 03/17/23   Jeffrie Oneil BROCKS, MD  senna (SENOKOT) 8.6 MG TABS tablet Take 2 tablets (17.2 mg total) by mouth 2 (two) times daily. 03/03/23   Aniceto Eva Grebe, PA-C  sertraline  (ZOLOFT ) 25 MG tablet TAKE 1 TABLET(25 MG) BY MOUTH DAILY 09/29/22   Burchette, Wolm ORN, MD  valACYclovir  (VALTREX ) 1000 MG tablet Take 1 tablet (1,000 mg total) by mouth 3 (three) times daily. 07/12/23   Micheal Wolm ORN, MD    Physical Exam    Vital Signs:  Tammy [name] does not have vital signs available for review today.  Given telephonic nature of communication, physical exam is limited. AAOx3. NAD. Normal affect.  Speech and respirations are unlabored.   Assessment & Plan    Primary Cardiologist: Oneil Jeffrie, MD  Preoperative cardiovascular risk assessment.  Right hallux MPJ arthrodesis/silver bunionectomy by Dr. Kit  Chart reviewed as part of pre-operative protocol coverage. According to the RCRI, patient has a 0.5% risk of MACE. Patient reports activity equivalent to 4.0 METS (walking when temperatures permit, tennis and pickleball).   Given past medical history and time since last visit, based on ACC/AHA guidelines, Tammy [name] would be at acceptable risk for the planned procedure without further cardiovascular testing.   Patient was advised that if she develops new symptoms prior to surgery to contact our office to arrange a  follow-up appointment.  she verbalized understanding.  Per Pharm D, patient has not had an Afib/aflutter ablation within the last 3 months or DCCV within the last 30 days. Patient may hold Xarelto  for 2 days prior to procedure.  Patient will not need bridging with Lovenox around procedure.    I will route this recommendation to the requesting party via Epic fax function.  Please call with questions.  Time:   Today, I have spent 5 minutes with the patient with telehealth technology discussing medical history, symptoms, and management plan.     Barnie Hila, NP  09/19/2023, 8:30 AM

## 2023-09-21 ENCOUNTER — Other Ambulatory Visit: Payer: Self-pay | Admitting: Cardiology

## 2023-09-21 DIAGNOSIS — I480 Paroxysmal atrial fibrillation: Secondary | ICD-10-CM

## 2023-09-21 NOTE — Telephone Encounter (Signed)
 Prescription refill request for Xarelto  received.  Indication: Afib  Last office visit: 02/11/23 Cecile)  Weight: 53.3kg Age: 73 Scr: 0.78 (09/13/23)  CrCl: 54.54ml/min  Appropriate dose. Refill sent.

## 2023-09-27 ENCOUNTER — Other Ambulatory Visit: Payer: Self-pay | Admitting: Family Medicine

## 2023-09-29 ENCOUNTER — Encounter (HOSPITAL_BASED_OUTPATIENT_CLINIC_OR_DEPARTMENT_OTHER): Payer: Self-pay | Admitting: Orthopedic Surgery

## 2023-10-06 ENCOUNTER — Encounter (HOSPITAL_BASED_OUTPATIENT_CLINIC_OR_DEPARTMENT_OTHER)
Admission: RE | Disposition: A | Discharge status: Home / Self Care | Payer: Self-pay | Source: Home / Self Care | Attending: Orthopedic Surgery

## 2023-10-06 ENCOUNTER — Ambulatory Visit (HOSPITAL_BASED_OUTPATIENT_CLINIC_OR_DEPARTMENT_OTHER)
Admission: RE | Admit: 2023-10-06 | Discharge: 2023-10-06 | Disposition: A | Discharge status: Home / Self Care | Attending: Orthopedic Surgery | Admitting: Orthopedic Surgery

## 2023-10-06 ENCOUNTER — Ambulatory Visit (HOSPITAL_BASED_OUTPATIENT_CLINIC_OR_DEPARTMENT_OTHER): Admitting: Anesthesiology

## 2023-10-06 ENCOUNTER — Encounter (HOSPITAL_BASED_OUTPATIENT_CLINIC_OR_DEPARTMENT_OTHER): Payer: Self-pay | Admitting: Orthopedic Surgery

## 2023-10-06 ENCOUNTER — Other Ambulatory Visit: Payer: Self-pay

## 2023-10-06 ENCOUNTER — Ambulatory Visit (HOSPITAL_BASED_OUTPATIENT_CLINIC_OR_DEPARTMENT_OTHER)

## 2023-10-06 DIAGNOSIS — M2021 Hallux rigidus, right foot: Secondary | ICD-10-CM

## 2023-10-06 DIAGNOSIS — Z87891 Personal history of nicotine dependence: Secondary | ICD-10-CM | POA: Diagnosis not present

## 2023-10-06 DIAGNOSIS — M2011 Hallux valgus (acquired), right foot: Secondary | ICD-10-CM | POA: Diagnosis not present

## 2023-10-06 DIAGNOSIS — Z79899 Other long term (current) drug therapy: Secondary | ICD-10-CM | POA: Insufficient documentation

## 2023-10-06 DIAGNOSIS — M21611 Bunion of right foot: Secondary | ICD-10-CM | POA: Diagnosis not present

## 2023-10-06 DIAGNOSIS — R519 Headache, unspecified: Secondary | ICD-10-CM | POA: Insufficient documentation

## 2023-10-06 DIAGNOSIS — E785 Hyperlipidemia, unspecified: Secondary | ICD-10-CM

## 2023-10-06 DIAGNOSIS — Z7901 Long term (current) use of anticoagulants: Secondary | ICD-10-CM | POA: Diagnosis not present

## 2023-10-06 DIAGNOSIS — I4891 Unspecified atrial fibrillation: Secondary | ICD-10-CM

## 2023-10-06 DIAGNOSIS — Z01818 Encounter for other preprocedural examination: Secondary | ICD-10-CM

## 2023-10-06 DIAGNOSIS — G8918 Other acute postprocedural pain: Secondary | ICD-10-CM | POA: Diagnosis not present

## 2023-10-06 HISTORY — PX: BUNIONECTOMY: SHX129

## 2023-10-06 HISTORY — PX: ARTHRODESIS METATARSALPHALANGEAL JOINT (MTPJ): SHX6566

## 2023-10-06 SURGERY — FUSION, JOINT, GREAT TOE
Anesthesia: General | Site: Foot | Laterality: Right

## 2023-10-06 MED ORDER — DEXAMETHASONE SODIUM PHOSPHATE 10 MG/ML IJ SOLN
INTRAMUSCULAR | Status: DC | PRN
  Administered 2023-10-06: 5 mg via INTRAVENOUS

## 2023-10-06 MED ORDER — DROPERIDOL 2.5 MG/ML IJ SOLN: 0.6250 mg | Freq: Once | INTRAMUSCULAR | Status: DC | PRN

## 2023-10-06 MED ORDER — DOCUSATE SODIUM 100 MG PO CAPS: 100.0000 mg | ORAL_CAPSULE | Freq: Two times a day (BID) | ORAL | 0 refills | Status: AC

## 2023-10-06 MED ORDER — LACTATED RINGERS IV SOLN: INTRAVENOUS | Status: DC

## 2023-10-06 MED ORDER — VANCOMYCIN HCL 500 MG IV SOLR
INTRAVENOUS | Status: DC | PRN
Start: 2023-10-06 — End: 2023-10-06
  Administered 2023-10-06: 500 mg via TOPICAL

## 2023-10-06 MED ORDER — MIDAZOLAM HCL 2 MG/2ML IJ SOLN
2.0000 mg | Freq: Once | INTRAMUSCULAR | Status: AC
  Administered 2023-10-06: 2 mg via INTRAVENOUS

## 2023-10-06 MED ORDER — EPHEDRINE SULFATE (PRESSORS) 50 MG/ML IJ SOLN
INTRAMUSCULAR | Status: DC | PRN
Start: 2023-10-06 — End: 2023-10-06
  Administered 2023-10-06 (×2): 5 mg via INTRAVENOUS

## 2023-10-06 MED ORDER — CEFAZOLIN SODIUM-DEXTROSE 2-4 GM/100ML-% IV SOLN
2.0000 g | INTRAVENOUS | Status: AC
  Administered 2023-10-06: 2 g via INTRAVENOUS

## 2023-10-06 MED ORDER — DEXAMETHASONE SODIUM PHOSPHATE 10 MG/ML IJ SOLN
INTRAMUSCULAR | Status: AC
  Filled 2023-10-06: qty 1

## 2023-10-06 MED ORDER — FENTANYL CITRATE (PF) 100 MCG/2ML IJ SOLN
50.0000 ug | Freq: Once | INTRAMUSCULAR | Status: AC
  Administered 2023-10-06: 50 ug via INTRAVENOUS

## 2023-10-06 MED ORDER — OXYCODONE HCL 5 MG/5ML PO SOLN: 5.0000 mg | Freq: Once | ORAL | Status: DC | PRN

## 2023-10-06 MED ORDER — FENTANYL CITRATE (PF) 100 MCG/2ML IJ SOLN
INTRAMUSCULAR | Status: AC
  Filled 2023-10-06: qty 2

## 2023-10-06 MED ORDER — SENNA 8.6 MG PO TABS: 2.0000 | ORAL_TABLET | Freq: Two times a day (BID) | ORAL | 0 refills | Status: AC

## 2023-10-06 MED ORDER — VANCOMYCIN HCL 500 MG IV SOLR
INTRAVENOUS | Status: AC
  Filled 2023-10-06: qty 10

## 2023-10-06 MED ORDER — PHENYLEPHRINE HCL (PRESSORS) 10 MG/ML IV SOLN
INTRAVENOUS | Status: DC | PRN
  Administered 2023-10-06: 80 ug via INTRAVENOUS

## 2023-10-06 MED ORDER — FENTANYL CITRATE (PF) 100 MCG/2ML IJ SOLN: 25.0000 ug | INTRAMUSCULAR | Status: DC | PRN

## 2023-10-06 MED ORDER — BUPIVACAINE-EPINEPHRINE (PF) 0.5% -1:200000 IJ SOLN
INTRAMUSCULAR | Status: DC | PRN
Start: 2023-10-06 — End: 2023-10-06
  Administered 2023-10-06: 20 mL

## 2023-10-06 MED ORDER — FENTANYL CITRATE (PF) 100 MCG/2ML IJ SOLN
INTRAMUSCULAR | Status: DC | PRN
  Administered 2023-10-06: 40 ug via INTRAVENOUS

## 2023-10-06 MED ORDER — LIDOCAINE HCL (CARDIAC) PF 100 MG/5ML IV SOSY
PREFILLED_SYRINGE | INTRAVENOUS | Status: DC | PRN
  Administered 2023-10-06: 40 mg via INTRAVENOUS

## 2023-10-06 MED ORDER — 0.9 % SODIUM CHLORIDE (POUR BTL) OPTIME
TOPICAL | Status: DC | PRN
  Administered 2023-10-06: 200 mL

## 2023-10-06 MED ORDER — OXYCODONE HCL 5 MG PO TABS: 5.0000 mg | ORAL_TABLET | Freq: Once | ORAL | Status: DC | PRN

## 2023-10-06 MED ORDER — MIDAZOLAM HCL 2 MG/2ML IJ SOLN
INTRAMUSCULAR | Status: AC
  Filled 2023-10-06: qty 2

## 2023-10-06 MED ORDER — BUPIVACAINE-EPINEPHRINE (PF) 0.5% -1:200000 IJ SOLN
INTRAMUSCULAR | Status: AC
  Filled 2023-10-06: qty 30

## 2023-10-06 MED ORDER — ONDANSETRON HCL 4 MG/2ML IJ SOLN
INTRAMUSCULAR | Status: DC | PRN
  Administered 2023-10-06: 4 mg via INTRAVENOUS

## 2023-10-06 MED ORDER — ONDANSETRON HCL 4 MG/2ML IJ SOLN
INTRAMUSCULAR | Status: AC
  Filled 2023-10-06: qty 2

## 2023-10-06 MED ORDER — CEFAZOLIN SODIUM-DEXTROSE 2-4 GM/100ML-% IV SOLN
INTRAVENOUS | Status: AC
  Filled 2023-10-06: qty 100

## 2023-10-06 MED ORDER — ROPIVACAINE HCL 5 MG/ML IJ SOLN
INTRAMUSCULAR | Status: DC | PRN
Start: 2023-10-06 — End: 2023-10-06
  Administered 2023-10-06: 10 mL via PERINEURAL

## 2023-10-06 MED ORDER — PROPOFOL 10 MG/ML IV BOLUS
INTRAVENOUS | Status: DC | PRN
  Administered 2023-10-06: 150 mg via INTRAVENOUS

## 2023-10-06 MED ORDER — PROPOFOL 500 MG/50ML IV EMUL
INTRAVENOUS | Status: DC | PRN
Start: 2023-10-06 — End: 2023-10-06
  Administered 2023-10-06: 150 ug/kg/min via INTRAVENOUS

## 2023-10-06 MED ORDER — ACETAMINOPHEN 10 MG/ML IV SOLN: 1000.0000 mg | Freq: Once | INTRAVENOUS | Status: DC | PRN

## 2023-10-06 MED ORDER — OXYCODONE HCL 5 MG PO TABS: 5.0000 mg | ORAL_TABLET | Freq: Four times a day (QID) | ORAL | 0 refills | Status: AC | PRN

## 2023-10-06 SURGICAL SUPPLY — 77 items
BIT DRILL 2.7XCANN QCK CNCT (BIT) IMPLANT
BIT DRILL Q-C 2.0 DIA 100 (BIT) IMPLANT
BLADE AVERAGE 25X9 (BLADE) IMPLANT
BLADE LONG MED 25X9 (BLADE) IMPLANT
BLADE MICRO SAGITTAL (BLADE) IMPLANT
BLADE MINI RND TIP GREEN BEAV (BLADE) ×2 IMPLANT
BLADE OSC/SAG .038X5.5 CUT EDG (BLADE) IMPLANT
BLADE SURG 15 STRL LF DISP TIS (BLADE) ×4 IMPLANT
BNDG COHESIVE 4X5 WHT NS (GAUZE/BANDAGES/DRESSINGS) IMPLANT
BNDG COMPR ESMARK 6X3 LF (GAUZE/BANDAGES/DRESSINGS) IMPLANT
BNDG ELASTIC 4INX 5YD STR LF (GAUZE/BANDAGES/DRESSINGS) ×2 IMPLANT
BNDG ELASTIC 6INX 5YD STR LF (GAUZE/BANDAGES/DRESSINGS) IMPLANT
BNDG STRETCH GAUZE 3IN X12FT (GAUZE/BANDAGES/DRESSINGS) ×2 IMPLANT
BOOT STEPPER DURA LG (SOFTGOODS) IMPLANT
BOOT STEPPER DURA MED (SOFTGOODS) IMPLANT
BOOT STEPPER DURA SM (SOFTGOODS) IMPLANT
BOOT STEPPER DURA XLG (SOFTGOODS) IMPLANT
BUR MIS CONICAL WEDGE 4.3X13 (BURR) IMPLANT
CHLORAPREP W/TINT 26 (MISCELLANEOUS) ×2 IMPLANT
COVER BACK TABLE 60X90IN (DRAPES) ×2 IMPLANT
CUFF TRNQT CYL 24X4X16.5-23 (TOURNIQUET CUFF) IMPLANT
CUFF TRNQT CYL 34X4.125X (TOURNIQUET CUFF) IMPLANT
DRAPE EXTREMITY T 121X128X90 (DISPOSABLE) ×2 IMPLANT
DRAPE INCISE IOBAN 66X45 STRL (DRAPES) IMPLANT
DRAPE OEC MINIVIEW 54X84 (DRAPES) ×2 IMPLANT
DRAPE U-SHAPE 47X51 STRL (DRAPES) ×2 IMPLANT
DRESSING MEPILEX FLEX 4X4 (GAUZE/BANDAGES/DRESSINGS) IMPLANT
DRSG MEPITEL 4X7.2 (GAUZE/BANDAGES/DRESSINGS) ×2 IMPLANT
ELECTRODE REM PT RTRN 9FT ADLT (ELECTROSURGICAL) ×2 IMPLANT
GAUZE PAD ABD 8X10 STRL (GAUZE/BANDAGES/DRESSINGS) ×2 IMPLANT
GAUZE SPONGE 4X4 12PLY STRL (GAUZE/BANDAGES/DRESSINGS) ×2 IMPLANT
GAUZE STRETCH 2X75IN STRL (MISCELLANEOUS) IMPLANT
GLOVE BIO SURGEON STRL SZ8 (GLOVE) ×2 IMPLANT
GLOVE BIOGEL PI IND STRL 6.5 (GLOVE) IMPLANT
GLOVE BIOGEL PI IND STRL 7.0 (GLOVE) IMPLANT
GLOVE BIOGEL PI IND STRL 7.5 (GLOVE) IMPLANT
GLOVE BIOGEL PI IND STRL 8 (GLOVE) ×4 IMPLANT
GLOVE ECLIPSE 8.0 STRL XLNG CF (GLOVE) ×2 IMPLANT
GLOVE SURG SS PI 6.5 STRL IVOR (GLOVE) IMPLANT
GLOVE SURG SS PI 7.0 STRL IVOR (GLOVE) IMPLANT
GOWN STRL REUS W/ TWL LRG LVL3 (GOWN DISPOSABLE) ×2 IMPLANT
GOWN STRL REUS W/ TWL XL LVL3 (GOWN DISPOSABLE) ×4 IMPLANT
GUIDEWIRE PIN ORTH 6X1.6XSMTH (WIRE) IMPLANT
KWIRE DBL .054X9 NSTRL (WIRE) ×2 IMPLANT
NDL HYPO 22X1.5 SAFETY MO (MISCELLANEOUS) IMPLANT
NDL HYPO 25X1 1.5 SAFETY (NEEDLE) IMPLANT
NEEDLE HYPO 22X1.5 SAFETY MO (MISCELLANEOUS) IMPLANT
NEEDLE HYPO 25X1 1.5 SAFETY (NEEDLE) IMPLANT
NS IRRIG 1000ML POUR BTL (IV SOLUTION) ×2 IMPLANT
PACK BASIN DAY SURGERY FS (CUSTOM PROCEDURE TRAY) ×2 IMPLANT
PAD CAST 4YDX4 CTTN HI CHSV (CAST SUPPLIES) ×2 IMPLANT
PADDING CAST ABS COTTON 4X4 ST (CAST SUPPLIES) IMPLANT
PADDING CAST COTTON 6X4 STRL (CAST SUPPLIES) IMPLANT
PENCIL SMOKE EVACUATOR (MISCELLANEOUS) ×2 IMPLANT
PLATE TUB 39 W/COLLAR 5H (Plate) IMPLANT
SANITIZER HAND ALTRA PUMP 550 (MISCELLANEOUS) ×2 IMPLANT
SCREW CANN 4X30 (Screw) IMPLANT
SCREW CORTICAL 2.7X14MM (Screw) IMPLANT
SCREW CORTICAL 2.7X18MM (Screw) IMPLANT
SHEET MEDIUM DRAPE 40X70 STRL (DRAPES) ×2 IMPLANT
SLEEVE SCD COMPRESS KNEE MED (STOCKING) ×2 IMPLANT
SPLINT PLASTER CAST FAST 5X30 (CAST SUPPLIES) IMPLANT
SPONGE SURGIFOAM ABS GEL 12-7 (HEMOSTASIS) IMPLANT
SPONGE T-LAP 18X18 ~~LOC~~+RFID (SPONGE) ×2 IMPLANT
STOCKINETTE 6 STRL (DRAPES) ×2 IMPLANT
SUCTION TUBE FRAZIER 10FR DISP (SUCTIONS) ×2 IMPLANT
SUT ETHILON 3 0 PS 1 (SUTURE) ×2 IMPLANT
SUT MNCRL AB 3-0 PS2 18 (SUTURE) ×2 IMPLANT
SUT VIC AB 2-0 SH 27XBRD (SUTURE) ×2 IMPLANT
SUT VICRYL 0 SH 27 (SUTURE) IMPLANT
SUT VICRYL 0 UR6 27IN ABS (SUTURE) IMPLANT
SYR BULB EAR ULCER 3OZ GRN STR (SYRINGE) ×2 IMPLANT
SYR CONTROL 10ML LL (SYRINGE) IMPLANT
TOWEL GREEN STERILE FF (TOWEL DISPOSABLE) ×4 IMPLANT
TUBE CONNECTING 20X1/4 (TUBING) ×2 IMPLANT
UNDERPAD 30X36 HEAVY ABSORB (UNDERPADS AND DIAPERS) ×2 IMPLANT
YANKAUER SUCT BULB TIP NO VENT (SUCTIONS) IMPLANT

## 2023-10-06 NOTE — Anesthesia Procedure Notes (Signed)
 Anesthesia Regional Block: Popliteal block   Pre-Anesthetic Checklist: , timeout performed,  Correct Patient, Correct Site, Correct Laterality,  Correct Procedure, Correct Position, site marked,  Risks and benefits discussed,  Surgical consent,  Pre-op evaluation,  At surgeon's request and post-op pain management  Laterality: Right  Prep: chloraprep       Needles:  Injection technique: Single-shot  Needle Type: Echogenic Stimulator Needle     Needle Length: 9cm  Needle Gauge: 21     Additional Needles:   Procedures:,,,, ultrasound used (permanent image in chart),,    Narrative:  Start time: 10/06/2023 8:30 AM End time: 10/06/2023 8:35 AM Injection made incrementally with aspirations every 5 mL.  Performed by: Personally  Anesthesiologist: Tilford Franky BIRCH, MD  Additional Notes: Discussed risks and benefits of the nerve block in detail, including but not limited vascular injury, permanent nerve damage and infection.   Patient tolerated the procedure well. Local anesthetic introduced in an incremental fashion under minimal resistance after negative aspirations. No paresthesias were elicited. After completion of the procedure, no acute issues were identified and patient continued to be monitored by RN.

## 2023-10-06 NOTE — Anesthesia Postprocedure Evaluation (Signed)
 Anesthesia Post Note  Patient: Tammy [name]  Procedure(s) Performed: Right hallux metatarsophalangeal joint arthrodesis (Right: Foot) Silver bunionectomy (Right: Foot)     Patient location during evaluation: PACU Anesthesia Type: General Level of consciousness: awake and alert Pain management: pain level controlled Vital Signs Assessment: post-procedure vital signs reviewed and stable Respiratory status: spontaneous breathing, nonlabored ventilation, respiratory function stable and patient connected to nasal cannula oxygen Cardiovascular status: blood pressure returned to baseline and stable Postop Assessment: no apparent nausea or vomiting Anesthetic complications: no   No notable events documented.  Last Vitals:  Vitals:   10/06/23 1045 10/06/23 1057  BP: 124/70 128/61  Pulse: 75 81  Resp: 13 16  Temp:  (!) 36.2 C  SpO2: 95% 95%    Last Pain:  Vitals:   10/06/23 1057  TempSrc:   PainSc: 1                  [name]

## 2023-10-06 NOTE — Op Note (Signed)
 10/06/2023  10:13 AM  PATIENT:  Tammy [name]  73 y.o. female  PRE-OPERATIVE DIAGNOSIS:  1.  Right foot bunion deformity      2.  Right hallux rigidus  POST-OPERATIVE DIAGNOSIS:  same  Procedure(s): Right foot silver bunionectomy 2.  Right hallux MP joint arthrodesis 3.  Right foot AP and lateral radiographs  SURGEON:  Norleen Armor, MD  ASSISTANT: Eva Barrack, PA-C  ANESTHESIA:   General, regional  EBL:  minimal   TOURNIQUET:   Total Tourniquet Time Documented: Thigh (Right) - 29 minutes Total: Thigh (Right) - 29 minutes  COMPLICATIONS:  None apparent  DISPOSITION:  Extubated, awake and stable to recovery.  INDICATION FOR PROCEDURE: 73 year old female with a past medical history significant for atrial fibrillation complains of worsening pain over the last several years due to a severe right foot bunion deformity.  She is pleased with the results of her left hallux MP joint arthrodesis suggesting moderately was saying condition.  Respiratory shortness of the help answer questions right hallux MP joint arthrodesis and silver bunionectomy.  She understands the risks and benefits of the alternative treatment options and elects for the treatment.  She specifically understands risks of bleeding, infection, nerve damage, blood clots, need for additional surgery, nonunion, amputation and death.  PROCEDURE IN DETAIL: After preoperative consent was obtained the correct operative site was identified, the patient to the operating room and placed supine on the operative table.  General anesthesia was induced.  Preoperative antibiotics were administered.  Surgical timeout was taken.  The right lower extremity was prepped and draped in standard sterile fashion with a tourniquet around.  The extremity was elevated, and the tourniquet to 250 mmHg.  A longitudinal incision was made over the hallux MP joint.  Dissection was carried sharply down through the subcutaneous tissues.  The EHL was  protected.  The dorsal joint capsule was incised and elevated medially and laterally.  The collateral ligaments were released exposing the metatarsal head.  The  bunionectomy was completed by removing the hypertrophic eminence with a rondure.  The metatarsal head was exposed.  A concave reamer was used to remove the remaining articular cartilage and subchondral bone.  A convex reamer was used to remove the remaining articular cartilage and subchondral bone from the base of the proximal phalanx.  The wound was irrigated copiously.  The joint was reduced and provisionally pinned.  Radiographs and a simulated weightbearing examination showed appropriate alignment of the toe.  The bunion was appropriately corrected.  The joint was then compressed with a 4 mm stainless steel [employer] cannulated screw.  A 5 mm one third tubular plate from the mini frag set was fixed dorsally across the joint.  AP and lateral radiographs confirmed appropriate reduction and compression of the joint.  The wound was irrigated copiously and sprinkled with vancomycin  powder.  The joint capsule was repaired over the plate with 2-0 Vicryl.  The skin incision was closed with horizontal mattress sutures of 3-0 nylon.  Sterile dressings were applied followed by a compression wrap and a cam boot.  The tourniquet was released after application of the dressings.  The patient was awakened from anesthesia and transported to the recovery room in stable condition.  FOLLOW UP PLAN: Weightbearing as tolerated on the heel in a tall cam boot.  There is an Eliquus postop day 1.  Follow-up with me in 2 weeks for suture removal.  Plan 6 weeks postoperative weightbearing immobilization in the boot.   RADIOGRAPHS: AP  and lateral radiographs of the right foot are obtained intraoperatively.  These show interval reduction and fixation of the hallux MP joint.  Hardware is appropriately positioned in the appropriate lengths.  No other acute injuries are  noted.    Justin Ollis PA-C was present and scrubbed for the duration of the operative case. His assistance was essential in positioning the patient, prepping and draping, gaining and maintaining exposure, performing the operation, closing and dressing the wounds and applying the splint.

## 2023-10-06 NOTE — Discharge Instructions (Addendum)
 Norleen Armor, MD [facility]  Please read the following information regarding your care after surgery.  Medications  You only need a prescription for the narcotic pain medicine (ex. oxycodone , Percocet, Norco).  All of the other medicines listed below are available over the counter. ? Aleve 2 pills twice a day for the first 3 days after surgery. ? acetominophen (Tylenol ) 650 mg every 4-6 hours as you need for minor to moderate pain ? oxycodone  as prescribed for severe pain  Narcotic pain medicine (ex. oxycodone , Percocet, Vicodin) will cause constipation.  To prevent this problem, take the following medicines while you are taking any pain medicine. ? docusate sodium  (Colace) 100 mg twice a day ? senna (Senokot) 2 tablets twice a day  ? To help prevent blood clots, resume Xarelto  the day after surgery.  You should also get up every hour while you are awake to move around.    Weight Bearing ? Bear weight only on your operated foot in the CAM boot.  Cast / Splint / Dressing ? Keep your splint, cast or dressing clean and dry.  Don't put anything (coat hanger, pencil, etc) down inside of it.  If it gets damp, use a hair dryer on the cool setting to dry it.  If it gets soaked, call the office to schedule an appointment for a cast change.  After your dressing, cast or splint is removed; you may shower, but do not soak or scrub the wound.  Allow the water to run over it, and then gently pat it dry.  Swelling It is normal for you to have swelling where you had surgery.  To reduce swelling and pain, keep your toes above your nose for at least 3 days after surgery.  It may be necessary to keep your foot or leg elevated for several weeks.  If it hurts, it should be elevated.  Follow Up Call my office at [phone] when you are discharged from the hospital or surgery center to schedule an appointment to be seen two weeks after surgery.  Call my office at [phone] if you develop a fever >101.5  F, nausea, vomiting, bleeding from the surgical site or severe pain.        Post Anesthesia Home Care Instructions  Activity: Get plenty of rest for the remainder of the day. A responsible individual must stay with you for 24 hours following the procedure.  For the next 24 hours, DO NOT: -Drive a car -Advertising copywriter -Drink alcoholic beverages -Take any medication unless instructed by your physician -Make any legal decisions or sign important papers.  Meals: Start with liquid foods such as gelatin or soup. Progress to regular foods as tolerated. Avoid greasy, spicy, heavy foods. If nausea and/or vomiting occur, drink only clear liquids until the nausea and/or vomiting subsides. Call your physician if vomiting continues.  Special Instructions/Symptoms: Your throat may feel dry or sore from the anesthesia or the breathing tube placed in your throat during surgery. If this causes discomfort, gargle with warm salt water. The discomfort should disappear within 24 hours.  If you had a scopolamine patch placed behind your ear for the management of post- operative nausea and/or vomiting:  1. The medication in the patch is effective for 72 hours, after which it should be removed.  Wrap patch in a tissue and discard in the trash. Wash hands thoroughly with soap and water. 2. You may remove the patch earlier than 72 hours if you experience unpleasant side effects which may include  dry mouth, dizziness or visual disturbances. 3. Avoid touching the patch. Wash your hands with soap and water after contact with the patch.    Regional Anesthesia Blocks  1. You may not be able to move or feel the blocked extremity after a regional anesthetic block. This may last may last from 3-48 hours after placement, but it will go away. The length of time depends on the medication injected and your individual response to the medication. As the nerves start to wake up, you may experience tingling as the  movement and feeling returns to your extremity. If the numbness and inability to move your extremity has not gone away after 48 hours, please call your surgeon.   2. The extremity that is blocked will need to be protected until the numbness is gone and the strength has returned. Because you cannot feel it, you will need to take extra care to avoid injury. Because it may be weak, you may have difficulty moving it or using it. You may not know what position it is in without looking at it while the block is in effect.  3. For blocks in the legs and feet, returning to weight bearing and walking needs to be done carefully. You will need to wait until the numbness is entirely gone and the strength has returned. You should be able to move your leg and foot normally before you try and bear weight or walk. You will need someone to be with you when you first try to ensure you do not fall and possibly risk injury.  4. Bruising and tenderness at the needle site are common side effects and will resolve in a few days.  5. Persistent numbness or new problems with movement should be communicated to the surgeon or the [facility] [phone] [facility] [phone]).

## 2023-10-06 NOTE — Anesthesia Procedure Notes (Signed)
 Procedure Name: LMA Insertion Date/Time: 10/06/2023 9:23 AM  Performed by: Julieanne Fairy BROCKS, CRNAPre-anesthesia Checklist: Patient identified, Emergency Drugs available, Suction available and Patient being monitored Patient Re-evaluated:Patient Re-evaluated prior to induction Oxygen Delivery Method: Circle system utilized Preoxygenation: Pre-oxygenation with 100% oxygen Induction Type: IV induction Ventilation: Mask ventilation without difficulty LMA: LMA inserted LMA Size: 4.0 Number of attempts: 1 Airway Equipment and Method: Bite block Placement Confirmation: positive ETCO2 Tube secured with: Tape Dental Injury: Teeth and Oropharynx as per pre-operative assessment

## 2023-10-06 NOTE — Anesthesia Procedure Notes (Signed)
 Anesthesia Regional Block: Adductor canal block   Pre-Anesthetic Checklist: , timeout performed,  Correct Patient, Correct Site, Correct Laterality,  Correct Procedure, Correct Position, site marked,  Risks and benefits discussed,  Surgical consent,  Pre-op evaluation,  At surgeon's request and post-op pain management  Laterality: Right  Prep: chloraprep       Needles:  Injection technique: Single-shot  Needle Type: Echogenic Stimulator Needle     Needle Length: 9cm  Needle Gauge: 21     Additional Needles:   Procedures:,,,, ultrasound used (permanent image in chart),,    Narrative:  Start time: 10/06/2023 8:35 AM End time: 10/06/2023 8:40 AM Injection made incrementally with aspirations every 5 mL.  Performed by: Personally  Anesthesiologist: Tilford Franky BIRCH, MD  Additional Notes: Discussed risks and benefits of the nerve block in detail, including but not limited vascular injury, permanent nerve damage and infection.   Patient tolerated the procedure well. Local anesthetic introduced in an incremental fashion under minimal resistance after negative aspirations. No paresthesias were elicited. After completion of the procedure, no acute issues were identified and patient continued to be monitored by RN.

## 2023-10-06 NOTE — H&P (Signed)
 Tammy [name] is an 73 y.o. female.   Chief Complaint: Right foot pain HPI: 73 year old female with a past medical history significant for atrial fibrillation complains of worsening right foot pain where she has a severe bunion deformity and hallux rigidus.  She is done well from a left hallux MP joint arthrodesis.  Past Medical History:  Diagnosis Date   Dysrhythmia    a fib s/p ablation 07/2021   Hyperlipidemia    PONV (postoperative nausea and vomiting)     Past Surgical History:  Procedure Laterality Date   ARTHRODESIS METATARSALPHALANGEAL JOINT (MTPJ) Left 03/03/2023   Procedure: ARTHRODESIS METATARSALPHALANGEAL JOINT (MTPJ);  Surgeon: Kit Rush, MD;  Location: [facility];  Service: Orthopedics;  Laterality: Left;  general, regional (add canal / pop / no exparel ) 90 min   ATRIAL FIBRILLATION ABLATION N/A 07/28/2021   Procedure: ATRIAL FIBRILLATION ABLATION;  Surgeon: Cindie Ole DASEN, MD;  Location: [facility];  Service: Cardiovascular;  Laterality: N/A;   BUNIONECTOMY Left 03/03/2023   Procedure: left silver bunionectomy;  Surgeon: Kit Rush, MD;  Location: [facility];  Service: Orthopedics;  Laterality: Left;   DILATION AND CURETTAGE OF UTERUS N/A     Family History  Problem Relation Age of Onset   Heart disease Mother 74       CABG   Cancer Mother        bladder cancer   Heart disease Father        MVP, atrial fibrilation   Cancer Father        colon cancer   Stroke Father    Cancer Sister        colon cancer   Stroke Sister    Social History:  reports that she quit smoking about 50 years ago. Her smoking use included cigarettes. She started smoking about 55 years ago. She has a 5 pack-year smoking history. She has never used smokeless tobacco. She reports that she does not currently use alcohol. She reports that she does not use drugs.  Allergies:  Allergies  Allergen Reactions   Novocain [Procaine] Palpitations    Penicillins Rash   Codeine Other (See Comments)    Pt. States crazy dreams   Contrast Media [Iodinated Contrast Media] Itching    Eye Swelling    Medications Prior to Admission  Medication Sig Dispense Refill   metoprolol  succinate (TOPROL  XL) 25 MG 24 hr tablet Take 0.5 tablets (12.5 mg total) by mouth at bedtime. 45 tablet 3   OVER THE COUNTER MEDICATION Take 2 capsules by mouth 2 (two) times daily. Algae cal +     OVER THE COUNTER MEDICATION Take 2 capsules by mouth at bedtime. Stronsium Boost     sertraline  (ZOLOFT ) 25 MG tablet TAKE 1 TABLET(25 MG) BY MOUTH DAILY 30 tablet 0   XARELTO  20 MG TABS tablet TAKE 1 TABLET(20 MG) BY MOUTH DAILY WITH SUPPER. 90 tablet 1   metoprolol  tartrate (LOPRESSOR ) 25 MG tablet TAKE 1 TABLET BY MOUTH ONCE DAILY AS NEEDED FOR BREAK THROUGH AFIB/TACHYCARDIA 30 tablet 5    No results found for this or any previous visit (from the past 48 hours). No results found.  Review of Systems no recent fever, chills, nausea, vomiting or changes in her appetite  Blood pressure 124/62, pulse 67, temperature 98 F (36.7 C), temperature source Temporal, resp. rate 11, height 5' 5.5 (1.664 m), weight 55.4 kg, SpO2 99%. Physical Exam  Well-nourished well-developed woman in no apparent distress.  Alert and oriented.  Normal mood and affect.  Gait is normal.  The left foot has a straight hallux with a healed surgical incision.  The right foot has a severe bunion deformity with decreased range of motion in dorsiflexion and plantarflexion.  Right foot has healthy skin and palpable pulses.  Normal sensibility to light touch dorsally and plantarly at the forefoot.  5 out of 5 strength in plantarflexion and dorsiflexion of the ankle and toes.   Assessment/Plan Right foot bunion deformity and hallux rigidus -to the operating room today for right hallux MP joint arthrodesis and silver bunionectomy.  The risks and benefits of the alternative treatment options have been discussed  in detail.  The patient wishes to proceed with surgery and specifically understands risks of bleeding, infection, nerve damage, blood clots, need for additional surgery, amputation and death.   Norleen Armor, MD 10/31/23, 8:20 AM

## 2023-10-06 NOTE — Progress Notes (Signed)
 Assisted Dr. Tilford with right, adductor canal, popliteal, ultrasound guided block. Side rails up, monitors on throughout procedure. See vital signs in flow sheet. Tolerated Procedure well.

## 2023-10-06 NOTE — Anesthesia Preprocedure Evaluation (Addendum)
 Anesthesia Evaluation  Patient identified by MRN, date of birth, ID band Patient awake    Reviewed: Allergy & Precautions, NPO status , Patient's Chart, lab work & pertinent test results, reviewed documented beta blocker date and time   History of Anesthesia Complications (+) PONV and history of anesthetic complications  Airway Mallampati: I  TM Distance: >3 FB Neck ROM: Full    Dental  (+) Teeth Intact, Dental Advisory Given   Pulmonary former smoker   breath sounds clear to auscultation       Cardiovascular + dysrhythmias Atrial Fibrillation  Rhythm:Regular Rate:Normal     Neuro/Psych  Headaches  negative psych ROS   GI/Hepatic negative GI ROS, Neg liver ROS,,,  Endo/Other  negative endocrine ROS    Renal/GU negative Renal ROS     Musculoskeletal negative musculoskeletal ROS (+)    Abdominal   Peds  Hematology negative hematology ROS (+)   Anesthesia Other Findings   Reproductive/Obstetrics                              Anesthesia Physical Anesthesia Plan  ASA: 3  Anesthesia Plan: General   Post-op Pain Management: Regional block*   Induction: Intravenous  PONV Risk Score and Plan: 4 or greater and Ondansetron , Dexamethasone , Midazolam  and Treatment may vary due to age or medical condition  Airway Management Planned: LMA  Additional Equipment: None  Intra-op Plan:   Post-operative Plan: Extubation in OR  Informed Consent: I have reviewed the patients History and Physical, chart, labs and discussed the procedure including the risks, benefits and alternatives for the proposed anesthesia with the patient or authorized representative who has indicated his/her understanding and acceptance.     Dental advisory given  Plan Discussed with: CRNA  Anesthesia Plan Comments:          Anesthesia Quick Evaluation

## 2023-10-06 NOTE — Transfer of Care (Signed)
 Immediate Anesthesia Transfer of Care Note  Patient: Emmajean Chilton Serpas  Procedure(s) Performed: Right hallux metatarsophalangeal joint arthrodesis (Right: Foot) Silver bunionectomy (Right: Foot)  Patient Location: PACU  Anesthesia Type:General  Level of Consciousness: sedated  Airway & Oxygen Therapy: Patient Spontanous Breathing and Patient connected to nasal cannula oxygen  Post-op Assessment: Report given to RN and Post -op Vital signs reviewed and stable  Post vital signs: Reviewed and stable  Last Vitals:  Vitals Value Taken Time  BP 123/57 10/06/23 10:15  Temp    Pulse 77 10/06/23 10:17  Resp 17 10/06/23 10:17  SpO2 100 % 10/06/23 10:17  Vitals shown include unfiled device data.  Last Pain:  Vitals:   10/06/23 0704  TempSrc: Temporal  PainSc: 0-No pain      Patients Stated Pain Goal: 3 (10/06/23 0704)  Complications: No notable events documented.

## 2023-10-07 ENCOUNTER — Encounter (HOSPITAL_BASED_OUTPATIENT_CLINIC_OR_DEPARTMENT_OTHER): Payer: Self-pay | Admitting: Orthopedic Surgery

## 2023-10-27 ENCOUNTER — Other Ambulatory Visit: Payer: Self-pay | Admitting: Family Medicine

## 2023-11-04 ENCOUNTER — Other Ambulatory Visit: Payer: Self-pay | Admitting: Family Medicine

## 2023-11-09 DIAGNOSIS — L570 Actinic keratosis: Secondary | ICD-10-CM | POA: Diagnosis not present

## 2023-11-09 DIAGNOSIS — D225 Melanocytic nevi of trunk: Secondary | ICD-10-CM | POA: Diagnosis not present

## 2023-11-09 DIAGNOSIS — D2272 Melanocytic nevi of left lower limb, including hip: Secondary | ICD-10-CM | POA: Diagnosis not present

## 2023-11-09 DIAGNOSIS — L578 Other skin changes due to chronic exposure to nonionizing radiation: Secondary | ICD-10-CM | POA: Diagnosis not present

## 2023-11-09 DIAGNOSIS — Z86018 Personal history of other benign neoplasm: Secondary | ICD-10-CM | POA: Diagnosis not present

## 2023-11-09 DIAGNOSIS — L821 Other seborrheic keratosis: Secondary | ICD-10-CM | POA: Diagnosis not present

## 2023-11-09 DIAGNOSIS — D239 Other benign neoplasm of skin, unspecified: Secondary | ICD-10-CM | POA: Diagnosis not present

## 2023-11-10 ENCOUNTER — Other Ambulatory Visit: Payer: Self-pay | Admitting: Cardiology

## 2023-11-16 DIAGNOSIS — M2021 Hallux rigidus, right foot: Secondary | ICD-10-CM | POA: Diagnosis not present

## 2023-11-16 DIAGNOSIS — Z4889 Encounter for other specified surgical aftercare: Secondary | ICD-10-CM | POA: Diagnosis not present

## 2023-12-01 ENCOUNTER — Other Ambulatory Visit: Payer: Self-pay | Admitting: Family Medicine

## 2023-12-15 DIAGNOSIS — M2021 Hallux rigidus, right foot: Secondary | ICD-10-CM | POA: Diagnosis not present

## 2023-12-15 DIAGNOSIS — Z4889 Encounter for other specified surgical aftercare: Secondary | ICD-10-CM | POA: Diagnosis not present

## 2023-12-25 ENCOUNTER — Other Ambulatory Visit: Payer: Self-pay | Admitting: Family Medicine

## 2023-12-29 ENCOUNTER — Other Ambulatory Visit: Payer: Self-pay | Admitting: Cardiology

## 2023-12-29 DIAGNOSIS — I480 Paroxysmal atrial fibrillation: Secondary | ICD-10-CM

## 2023-12-30 NOTE — Telephone Encounter (Signed)
 Prescription refill request for Xarelto  received.  Indication: A. FIB Last office visit:02/11/23 Weight: 122 lbs Age: 73 Scr: 0.78 (EPIC 09/13/23) CrCl: 56  Patient is schedule to see Dr. Jeffrie in February. Will refill Xarelto  until she is seen.

## 2024-01-02 ENCOUNTER — Ambulatory Visit

## 2024-01-02 VITALS — Ht 65.5 in | Wt 122.0 lb

## 2024-01-02 DIAGNOSIS — Z0000 Encounter for general adult medical examination without abnormal findings: Secondary | ICD-10-CM

## 2024-01-02 NOTE — Progress Notes (Signed)
 Subjective:   Tammy [name] is a 73 y.o. female who presents for a [employer] Visit.  Allergies (verified) Novocain [procaine], Penicillins, Codeine, and Contrast media [iodinated contrast media]   History: Past Medical History:  Diagnosis Date   Dysrhythmia    a fib s/p ablation 07/2021   Hyperlipidemia    PONV (postoperative nausea and vomiting)    Past Surgical History:  Procedure Laterality Date   ARTHRODESIS METATARSALPHALANGEAL JOINT (MTPJ) Left 03/03/2023   Procedure: ARTHRODESIS METATARSALPHALANGEAL JOINT (MTPJ);  Surgeon: Kit Rush, MD;  Location: [facility];  Service: Orthopedics;  Laterality: Left;  general, regional (add canal / pop / no exparel ) 90 min   ARTHRODESIS METATARSALPHALANGEAL JOINT (MTPJ) Right 10/06/2023   Procedure: Right hallux metatarsophalangeal joint arthrodesis;  Surgeon: Kit Rush, MD;  Location: [facility];  Service: Orthopedics;  Laterality: Right;   ATRIAL FIBRILLATION ABLATION N/A 07/28/2021   Procedure: ATRIAL FIBRILLATION ABLATION;  Surgeon: Cindie Ole DASEN, MD;  Location: [facility];  Service: Cardiovascular;  Laterality: N/A;   BUNIONECTOMY Left 03/03/2023   Procedure: left silver bunionectomy;  Surgeon: Kit Rush, MD;  Location: [facility];  Service: Orthopedics;  Laterality: Left;   BUNIONECTOMY Right 10/06/2023   Procedure: Silver bunionectomy;  Surgeon: Kit Rush, MD;  Location: [facility];  Service: Orthopedics;  Laterality: Right;   DILATION AND CURETTAGE OF UTERUS N/A    Family History  Problem Relation Age of Onset   Heart disease Mother 42       CABG   Cancer Mother        bladder cancer   Heart disease Father        MVP, atrial fibrilation   Cancer Father        colon cancer   Stroke Father    Cancer Sister        colon cancer   Stroke Sister    Social History   Occupational History   Occupation: RETIRED  Tobacco Use    Smoking status: Former    Current packs/day: 0.00    Average packs/day: 1 pack/day for 5.0 years (5.0 ttl pk-yrs)    Types: Cigarettes    Start date: 05/26/1968    Quit date: 05/26/1973    Years since quitting: 50.6   Smokeless tobacco: Never   Tobacco comments:    Former smoker 11/17/2020  Vaping Use   Vaping status: Never Used  Substance and Sexual Activity   Alcohol use: Not Currently   Drug use: Never   Sexual activity: Not on file   Tobacco Counseling Counseling given: Not Answered Tobacco comments: Former smoker 11/17/2020  SDOH Screenings   Food Insecurity: No Food Insecurity (12/30/2023)  Housing: Low Risk  (12/30/2023)  Transportation Needs: No Transportation Needs (12/30/2023)  Utilities: Not At Risk (01/02/2024)  Alcohol Screen: Low Risk  (05/25/2021)  Depression (PHQ2-9): Low Risk  (01/02/2024)  Financial Resource Strain: Low Risk  (07/12/2023)  Physical Activity: Insufficiently Active (12/30/2023)  Social Connections: Unknown (12/30/2023)  Stress: No Stress Concern Present (12/30/2023)  Tobacco Use: Medium Risk (01/02/2024)  Health Literacy: Adequate Health Literacy (01/02/2024)   Depression Screen    01/02/2024    3:12 PM 07/12/2023    9:47 AM 06/10/2022    2:10 PM 05/25/2021    3:25 PM 03/09/2021    9:51 AM 05/16/2020    1:30 PM 01/26/2019    9:00 AM  PHQ 2/9 Scores  PHQ - 2  Score 0 0 0 0 0 0 0  PHQ- 9 Score 0      0      Data saved with a previous flowsheet row definition      Goals Addressed               This Visit's Progress     Increase physical activity (pt-stated)        Would like to start walking more. Had Bunion surgery on both feet/2025.  Still working on this goal       Visit info / Clinical Intake: Medicare Wellness Visit Type:: Subsequent Annual Wellness Visit Persons participating in visit:: patient Medicare Wellness Visit Mode:: Video If Telephone or Video please confirm:: I connected with the patient using audio enabled  telemedicine application and verified that I am speaking with the correct person using two identifiers; I discussed the limitations of evaluation and management by telemedicine; The patient expressed understanding and agreed to proceed Patient Location:: Home Provider Location:: Home Information given by:: patient Interpreter Needed?: No Pre-visit prep was completed: yes AWV questionnaire completed by patient prior to visit?: yes Date:: 12/29/23 Living arrangements:: lives with spouse/significant other Patient's Overall Health Status Rating: very good Typical amount of pain: none Does pain affect daily life?: no Are you currently prescribed opioids?: no  Dietary Habits and Nutritional Risks How many meals a day?: 3 Eats fruit and vegetables daily?: yes Most meals are obtained by: preparing own meals; eating out Diabetic:: no  Functional Status Activities of Daily Living (to include ambulation/medication): (Patient-Rptd) Independent Ambulation: (Patient-Rptd) Independent Medication Administration: Independent Home Management: (Patient-Rptd) Independent Manage your own finances?: yes Primary transportation is: driving Concerns about vision?: no *vision screening is required for WTM* Concerns about hearing?: no  Fall Screening Falls in the past year?: (Patient-Rptd) 0 Number of falls in past year: (Patient-Rptd) 0 Was there an injury with Fall?: (Patient-Rptd) 0 Fall Risk Category Calculator: (Patient-Rptd) 0 Patient Fall Risk Level: (Patient-Rptd) Low Fall Risk  Fall Risk Patient at Risk for Falls Due to: No Fall Risks Fall risk Follow up: Falls evaluation completed; Falls prevention discussed  Home and Transportation Safety: All rugs have non-skid backing?: yes All stairs or steps have railings?: yes (2 story home) Grab bars in the bathtub or shower?: (!) no Have non-skid surface in bathtub or shower?: yes Good home lighting?: yes Regular seat belt use?: yes Hospital  stays in the last year:: no  Cognitive Assessment Difficulty concentrating, remembering, or making decisions? : no Will 6CIT or Mini Cog be Completed: no 6CIT or Mini Cog Declined: patient alert, oriented, able to answer questions appropriately and recall recent events  Advance Directives (For Healthcare) Does Patient Have a Medical Advance Directive?: Yes Does patient want to make changes to medical advance directive?: No - Patient declined Type of Advance Directive: Healthcare Power of Tammy [name]; Living will Copy of Healthcare Power of Attorney in Chart?: No - copy requested Copy of Living Will in Chart?: No - copy requested  Reviewed/Updated  Reviewed/Updated: Reviewed All (Medical, Surgical, Family, Medications, Allergies, Care Teams, Patient Goals)        Objective:    Today's Vitals   01/02/24 1500  Weight: 122 lb (55.3 kg)  Height: 5' 5.5 (1.664 m)   Body mass index is 19.99 kg/m.  Current Medications (verified) Outpatient Encounter Medications as of 01/02/2024  Medication Sig   docusate sodium  (COLACE) 100 MG capsule Take 1 capsule (100 mg total) by mouth 2 (two) times daily. While taking  narcotic pain medicine.   metoprolol  succinate (TOPROL  XL) 25 MG 24 hr tablet Take 0.5 tablets (12.5 mg total) by mouth at bedtime.   metoprolol  tartrate (LOPRESSOR ) 25 MG tablet TAKE 1 TABLET BY MOUTH ONCE DAILY AS NEEDED FOR BREAK THROUGH AFIB/TACHYCARDIA   OVER THE COUNTER MEDICATION Take 2 capsules by mouth 2 (two) times daily. Algae cal +   OVER THE COUNTER MEDICATION Take 2 capsules by mouth at bedtime. Stronsium Boost   senna (SENOKOT) 8.6 MG TABS tablet Take 2 tablets (17.2 mg total) by mouth 2 (two) times daily.   sertraline  (ZOLOFT ) 25 MG tablet TAKE 1 TABLET(25 MG) BY MOUTH DAILY   XARELTO  20 MG TABS tablet TAKE 1 TABLET(20 MG) BY MOUTH DAILY WITH SUPPER   No facility-administered encounter medications on file as of 01/02/2024.   Hearing/Vision screen Hearing Screening  - Comments:: Denies hearing difficulties   Vision Screening - Comments:: Denies vision issues./Dr. Norleen Scott/[facility]/UTD  Immunizations and Health Maintenance Health Maintenance  Topic Date Due   Zoster Vaccines- Shingrix (1 of 2) Never done   Influenza Vaccine  09/23/2023   COVID-19 Vaccine (7 - 2025-26 season) 10/24/2023   DTaP/Tdap/Td (4 - Td or Tdap) 04/30/2024   Mammogram  05/16/2024   Medicare Annual Wellness (AWV)  01/01/2025   Colonoscopy  04/07/2027   Pneumococcal Vaccine: 50+ Years  Completed   DEXA SCAN  Completed   Hepatitis C Screening  Completed   Meningococcal B Vaccine  Aged Out        Assessment/Plan:  This is a routine wellness examination for Tammy [name].  Patient Care Team: Micheal Wolm ORN, MD as PCP - General Jeffrie Oneil BROCKS, MD as PCP - Cardiology (Cardiology) Cindie Ole DASEN, MD as PCP - Electrophysiology (Cardiology)  I have personally reviewed and noted the following in the patient's chart:   Medical and social history Use of alcohol, tobacco or illicit drugs  Current medications and supplements including opioid prescriptions. Functional ability and status Nutritional status Physical activity Advanced directives List of other physicians Hospitalizations, surgeries, and ER visits in previous 12 months Vitals Screenings to include cognitive, depression, and falls Referrals and appointments  No orders of the defined types were placed in this encounter.  In addition, I have reviewed and discussed with patient certain preventive protocols, quality metrics, and best practice recommendations. A written personalized care plan for preventive services as well as general preventive health recommendations were provided to patient.   Vannie Hilgert L Jasmeen [name], Tammy [name]   01/02/2024   Return in 1 year (on 01/01/2025).  After Visit Summary: (MyChart) Due to this being a telephonic visit, the after visit summary with patients personalized plan was offered  to patient via MyChart   Nurse Notes: Patient is due for a flu vaccine and would like to get it during her office visit tomorrow.  She is due for a Shingrix vaccine.  Patient is up to date on all other health maintenance with no concerns to address today.

## 2024-01-02 NOTE — Patient Instructions (Signed)
 Tammy [name],  Thank you for taking the time for your Medicare Wellness Visit. I appreciate your continued commitment to your health goals. Please review the care plan we discussed, and feel free to reach out if I can assist you further.  Please note that Annual Wellness Visits do not include a physical exam. Some assessments may be limited, especially if the visit was conducted virtually. If needed, we may recommend an in-person follow-up with your provider.  Ongoing Care Seeing your primary care provider every 3 to 6 months helps us  monitor your health and provide consistent, personalized care. Next office visit on 01/03/2024.  Remember to get the bone density screening results sent over to Dr. Micheal.  You are due for a Flu vaccine and a Shingles vaccine.    Referrals If a referral was made during today's visit and you haven't received any updates within two weeks, please contact the referred provider directly to check on the status.  Recommended Screenings:  Health Maintenance  Topic Date Due   Zoster (Shingles) Vaccine (1 of 2) Never done   Medicare Annual Wellness Visit  06/10/2023   Flu Shot  09/23/2023   COVID-19 Vaccine (7 - 2025-26 season) 10/24/2023   DTaP/Tdap/Td vaccine (4 - Td or Tdap) 04/30/2024   Breast Cancer Screening  05/16/2024   Colon Cancer Screening  04/07/2027   Pneumococcal Vaccine for age over 63  Completed   DEXA scan (bone density measurement)  Completed   Hepatitis C Screening  Completed   Meningitis B Vaccine  Aged Out       12/29/2023    9:05 AM  Advanced Directives  Does Patient Have a Medical Advance Directive? Yes  Type of Estate Agent of [place];Living will    Vision: Annual vision screenings are recommended for early detection of glaucoma, cataracts, and diabetic retinopathy. These exams can also reveal signs of chronic conditions such as diabetes and high blood pressure.  Dental: Annual dental screenings help detect  early signs of oral cancer, gum disease, and other conditions linked to overall health, including heart disease and diabetes.  Please see the attached documents for additional preventive care recommendations.

## 2024-01-03 ENCOUNTER — Encounter: Payer: Self-pay | Admitting: Family Medicine

## 2024-01-03 ENCOUNTER — Ambulatory Visit (INDEPENDENT_AMBULATORY_CARE_PROVIDER_SITE_OTHER): Admitting: Family Medicine

## 2024-01-03 VITALS — BP 136/66 | HR 76 | Temp 97.7°F | Ht 65.35 in | Wt 121.3 lb

## 2024-01-03 DIAGNOSIS — Z0000 Encounter for general adult medical examination without abnormal findings: Secondary | ICD-10-CM

## 2024-01-03 DIAGNOSIS — E785 Hyperlipidemia, unspecified: Secondary | ICD-10-CM | POA: Diagnosis not present

## 2024-01-03 DIAGNOSIS — Z23 Encounter for immunization: Secondary | ICD-10-CM

## 2024-01-03 NOTE — Patient Instructions (Signed)
 Consider Shingrix vaccine and RSV by age 74

## 2024-01-03 NOTE — Progress Notes (Signed)
 Established Patient Office Visit  Subjective   Patient ID: Tammy [name], female    DOB: 06-05-1950  Age: 73 y.o. MRN: [id number]  Chief Complaint  Patient presents with   Annual Exam    HPI   [name] is seen for follow-up of her physical exam.  She has history of atrial fibrillation, migraine headaches, osteoporosis, hyperlipidemia.  She had ablation procedure for A-fib couple years ago and has remained in sinus rhythm since then.  Maintained on Xarelto .  Also takes low-dose metoprolol .  She is very health-conscious.  She exercises regularly with pickle ball.  Plays less tennis.  She has had mild hyperlipidemia in the past but previous coronary calcium  score of 0.  Her daughter recently had LP(a) score of around 190.  Patient's husband had recent LP(a) which came back low range.  Thus, patient concerned that she may have elevated LP(a) and is requesting level today.  Health maintenance reviewed:  Health Maintenance  Topic Date Due   Zoster Vaccines- Shingrix (1 of 2) Never done   COVID-19 Vaccine (7 - 2025-26 season) 10/24/2023   DTaP/Tdap/Td (4 - Td or Tdap) 04/30/2024   Mammogram  05/16/2024   Medicare Annual Wellness (AWV)  01/01/2025   Colonoscopy  04/07/2027   Pneumococcal Vaccine: 50+ Years  Completed   Influenza Vaccine  Completed   DEXA SCAN  Completed   Hepatitis C Screening  Completed   Meningococcal B Vaccine  Aged Out   - Has not had RSV vaccine yet.  Also no history of Shingrix.  Other vaccines up-to-date.  Colonoscopy up-to-date.  Social history-married with 2 children and 3 grandchildren.  Smoked only briefly for about 5 years.  No alcohol.  Enjoys pickleball and has played tennis a lot in the past.  Family history Sister and father with history of colon cancer.  Father had atrial fibrillation.  Mom had history of bladder cancer and CAD.  She died around age 50.  Past Medical History:  Diagnosis Date   Dysrhythmia    a fib s/p ablation 07/2021    Hyperlipidemia    PONV (postoperative nausea and vomiting)    Past Surgical History:  Procedure Laterality Date   ARTHRODESIS METATARSALPHALANGEAL JOINT (MTPJ) Left 03/03/2023   Procedure: ARTHRODESIS METATARSALPHALANGEAL JOINT (MTPJ);  Surgeon: Kit Rush, MD;  Location: [facility];  Service: Orthopedics;  Laterality: Left;  general, regional (add canal / pop / no exparel ) 90 min   ARTHRODESIS METATARSALPHALANGEAL JOINT (MTPJ) Right 10/06/2023   Procedure: Right hallux metatarsophalangeal joint arthrodesis;  Surgeon: Kit Rush, MD;  Location: [facility];  Service: Orthopedics;  Laterality: Right;   ATRIAL FIBRILLATION ABLATION N/A 07/28/2021   Procedure: ATRIAL FIBRILLATION ABLATION;  Surgeon: Cindie Ole DASEN, MD;  Location: [facility];  Service: Cardiovascular;  Laterality: N/A;   BUNIONECTOMY Left 03/03/2023   Procedure: left silver bunionectomy;  Surgeon: Kit Rush, MD;  Location: [facility];  Service: Orthopedics;  Laterality: Left;   BUNIONECTOMY Right 10/06/2023   Procedure: Silver bunionectomy;  Surgeon: Kit Rush, MD;  Location: [facility];  Service: Orthopedics;  Laterality: Right;   DILATION AND CURETTAGE OF UTERUS N/A     reports that she quit smoking about 50 years ago. Her smoking use included cigarettes. She started smoking about 55 years ago. She has a 5 pack-year smoking history. She has never used smokeless tobacco. She reports that she does not currently use alcohol. She reports that she does not  use drugs. family history includes Cancer in her father, mother, and sister; Heart disease in her father; Heart disease (age of onset: 76) in her mother; Stroke in her father and sister. Allergies  Allergen Reactions   Novocain [Procaine] Palpitations   Penicillins Rash   Codeine Other (See Comments)    Pt. States crazy dreams   Contrast Media [Iodinated Contrast Media] Itching    Eye Swelling      Review of Systems  Constitutional:  Negative for chills, fever, malaise/fatigue and weight loss.  HENT:  Negative for hearing loss.   Eyes:  Negative for blurred vision and double vision.  Respiratory:  Negative for cough and shortness of breath.   Cardiovascular:  Negative for chest pain, palpitations and leg swelling.  Gastrointestinal:  Negative for abdominal pain, blood in stool, constipation and diarrhea.  Genitourinary:  Negative for dysuria.  Skin:  Negative for rash.  Neurological:  Negative for dizziness, speech change, seizures, loss of consciousness and headaches.  Psychiatric/Behavioral:  Negative for depression.       Objective:     BP 136/66   Pulse 76   Temp 97.7 F (36.5 C) (Oral)   Ht 5' 5.35 (1.66 m)   Wt 121 lb 4.8 oz (55 kg)   LMP  (LMP Unknown)   SpO2 96%   BMI 19.97 kg/m  BP Readings from Last 3 Encounters:  01/03/24 136/66  10/06/23 128/61  07/12/23 118/64   Wt Readings from Last 3 Encounters:  01/03/24 121 lb 4.8 oz (55 kg)  01/02/24 122 lb (55.3 kg)  10/06/23 122 lb 2.2 oz (55.4 kg)      Physical Exam Vitals reviewed.  Constitutional:      General: She is not in acute distress.    Appearance: She is well-developed.  HENT:     Head: Normocephalic and atraumatic.  Eyes:     Pupils: Pupils are equal, round, and reactive to light.  Neck:     Thyroid : No thyromegaly.  Cardiovascular:     Rate and Rhythm: Normal rate and regular rhythm.     Heart sounds: Normal heart sounds. No murmur heard. Pulmonary:     Effort: No respiratory distress.     Breath sounds: Normal breath sounds. No wheezing or rales.  Abdominal:     General: Bowel sounds are normal. There is no distension.     Palpations: Abdomen is soft. There is no mass.     Tenderness: There is no abdominal tenderness. There is no guarding or rebound.  Musculoskeletal:        General: Normal range of motion.     Cervical back: Normal range of motion and neck supple.      Right lower leg: No edema.     Left lower leg: No edema.  Lymphadenopathy:     Cervical: No cervical adenopathy.  Skin:    Findings: No rash.  Neurological:     Mental Status: She is alert and oriented to person, place, and time.     Cranial Nerves: No cranial nerve deficit.  Psychiatric:        Behavior: Behavior normal.        Thought Content: Thought content normal.        Judgment: Judgment normal.      No results found for any visits on 01/03/24.  Last CBC Lab Results  Component Value Date   WBC 7.6 09/13/2023   HGB 14.0 09/13/2023   HCT 42.4 09/13/2023   MCV 99 (H)  09/13/2023   MCH 32.6 09/13/2023   RDW 13.0 09/13/2023   PLT 275 09/13/2023   Last metabolic panel Lab Results  Component Value Date   GLUCOSE 119 (H) 09/13/2023   NA 142 09/13/2023   K 4.0 09/13/2023   CL 102 09/13/2023   CO2 23 09/13/2023   BUN 18 09/13/2023   CREATININE 0.78 09/13/2023   EGFR 80 09/13/2023   CALCIUM  9.1 09/13/2023   PROT 7.6 03/09/2021   ALBUMIN 4.3 03/09/2021   BILITOT 0.6 03/09/2021   ALKPHOS 65 03/09/2021   AST 22 03/09/2021   ALT 25 03/09/2021   ANIONGAP 8 01/20/2016   Last lipids Lab Results  Component Value Date   CHOL 207 (H) 03/09/2021   HDL 55.40 03/09/2021   LDLCALC 135 (H) 03/09/2021   LDLDIRECT 142.6 03/04/2010   TRIG 86.0 03/09/2021   CHOLHDL 4 03/09/2021      The 89-bzjm ASCVD risk score (Arnett DK, et al., 2019) is: 14.6%    Assessment & Plan:   Problem List Items Addressed This Visit       Unprioritized   Hyperlipidemia   Relevant Orders   Lipid panel   CMP   Lipoprotein A (LPA)   Other Visit Diagnoses       Need for influenza vaccination    -  Primary   Relevant Orders   Flu vaccine HIGH DOSE PF(Fluzone Trivalent) (Completed)     Physical exam         Here for physical exam.  Very health-conscious 73 year old female.  Past history of atrial fibrillation treated with ablation.  On chronic Xarelto .  She does have mild  hyperlipidemia and is requesting follow-up lipids along with LP(a).  This will be drawn today.  She is aware that currently we do not have specific drugs yet to lower LP(a) other than PCSK9 inhibitors which can lower in the neighborhood of 25%.  Continue low saturated fat diet and regular exercise habits.  She is followed by GYN for Pap smears and DEXA scans.  We did discuss consideration for RSV vaccine at age 52 and consider Shingrix vaccine at some point this year.  Flu vaccine given today  No follow-ups on file.    Wolm Scarlet, MD

## 2024-01-05 ENCOUNTER — Ambulatory Visit: Payer: Self-pay | Admitting: Family Medicine

## 2024-01-05 ENCOUNTER — Other Ambulatory Visit (INDEPENDENT_AMBULATORY_CARE_PROVIDER_SITE_OTHER)

## 2024-01-05 DIAGNOSIS — E785 Hyperlipidemia, unspecified: Secondary | ICD-10-CM | POA: Diagnosis not present

## 2024-01-05 DIAGNOSIS — E7841 Elevated Lipoprotein(a): Secondary | ICD-10-CM

## 2024-01-05 LAB — COMPREHENSIVE METABOLIC PANEL WITH GFR
ALT: 15 U/L (ref 0–35)
AST: 19 U/L (ref 0–37)
Albumin: 4.3 g/dL (ref 3.5–5.2)
Alkaline Phosphatase: 69 U/L (ref 39–117)
BUN: 17 mg/dL (ref 6–23)
CO2: 28 meq/L (ref 19–32)
Calcium: 9.6 mg/dL (ref 8.4–10.5)
Chloride: 101 meq/L (ref 96–112)
Creatinine, Ser: 0.8 mg/dL (ref 0.40–1.20)
GFR: 72.82 mL/min (ref >60.00)
Glucose, Bld: 84 mg/dL (ref 70–99)
Potassium: 4.2 meq/L (ref 3.5–5.1)
Sodium: 138 meq/L (ref 135–145)
Total Bilirubin: 0.6 mg/dL (ref 0.2–1.2)
Total Protein: 7.3 g/dL (ref 6.0–8.3)

## 2024-01-05 LAB — LIPID PANEL
Cholesterol: 208 mg/dL — ABNORMAL HIGH (ref 0–200)
HDL: 58.8 mg/dL (ref >39.00)
LDL Cholesterol: 134 mg/dL — ABNORMAL HIGH (ref 0–99)
NonHDL: 149.58
Total CHOL/HDL Ratio: 4
Triglycerides: 76 mg/dL (ref 0.0–149.0)
VLDL: 15.2 mg/dL (ref 0.0–40.0)

## 2024-01-10 LAB — LIPOPROTEIN A (LPA): Lipoprotein (a): 192 nmol/L — ABNORMAL HIGH (ref <75)

## 2024-01-11 MED ORDER — ROSUVASTATIN CALCIUM 10 MG PO TABS: 10.0000 mg | ORAL_TABLET | Freq: Every day | ORAL | 3 refills | Status: AC

## 2024-01-11 NOTE — Addendum Note (Signed)
 Addended by: METTA KRISTEN CROME on: 01/11/2024 08:27 AM   Modules accepted: Orders

## 2024-01-17 DIAGNOSIS — M816 Localized osteoporosis [Lequesne]: Secondary | ICD-10-CM | POA: Diagnosis not present

## 2024-01-17 DIAGNOSIS — Z6820 Body mass index (BMI) 20.0-20.9, adult: Secondary | ICD-10-CM | POA: Diagnosis not present

## 2024-01-17 DIAGNOSIS — N958 Other specified menopausal and perimenopausal disorders: Secondary | ICD-10-CM | POA: Diagnosis not present

## 2024-01-17 DIAGNOSIS — Z124 Encounter for screening for malignant neoplasm of cervix: Secondary | ICD-10-CM | POA: Diagnosis not present

## 2024-01-17 LAB — HM DEXA SCAN

## 2024-01-22 ENCOUNTER — Other Ambulatory Visit: Payer: Self-pay | Admitting: Family Medicine

## 2024-02-19 ENCOUNTER — Other Ambulatory Visit: Payer: Self-pay | Admitting: Family Medicine

## 2024-02-27 ENCOUNTER — Other Ambulatory Visit: Payer: Self-pay

## 2024-02-29 MED ORDER — METOPROLOL SUCCINATE ER 25 MG PO TB24: 12.5000 mg | ORAL_TABLET | Freq: Every day | ORAL | 3 refills | Status: AC

## 2024-04-11 ENCOUNTER — Ambulatory Visit: Admitting: Cardiology
